# Patient Record
Sex: Male | Born: 1960 | Race: White | Hispanic: No | Marital: Single | Smoking: Never smoker
Health system: Southern US, Community
[De-identification: ages and names within clinical notes are randomized; demographics above are authoritative.]

## PROBLEM LIST (undated history)

## (undated) DIAGNOSIS — I499 Cardiac arrhythmia, unspecified: Secondary | ICD-10-CM

## (undated) DIAGNOSIS — I10 Essential (primary) hypertension: Secondary | ICD-10-CM

## (undated) DIAGNOSIS — I509 Heart failure, unspecified: Secondary | ICD-10-CM

## (undated) DIAGNOSIS — I712 Thoracic aortic aneurysm, without rupture, unspecified: Secondary | ICD-10-CM

## (undated) DIAGNOSIS — I714 Abdominal aortic aneurysm, without rupture, unspecified: Secondary | ICD-10-CM

## (undated) DIAGNOSIS — N189 Chronic kidney disease, unspecified: Secondary | ICD-10-CM

## (undated) DIAGNOSIS — I350 Nonrheumatic aortic (valve) stenosis: Secondary | ICD-10-CM

## (undated) HISTORY — DX: Chronic kidney disease, unspecified: N18.9

## (undated) HISTORY — PX: TONSILLECTOMY: SUR1361

## (undated) HISTORY — DX: Abdominal aortic aneurysm, without rupture, unspecified: I71.40

## (undated) HISTORY — DX: Abdominal aortic aneurysm, without rupture: I71.4

## (undated) HISTORY — DX: Thoracic aortic aneurysm, without rupture, unspecified: I71.20

## (undated) HISTORY — DX: Nonrheumatic aortic (valve) stenosis: I35.0

## (undated) HISTORY — DX: Heart failure, unspecified: I50.9

## (undated) HISTORY — PX: MANDIBLE FRACTURE SURGERY: SHX706

## (undated) HISTORY — DX: Essential (primary) hypertension: I10

---

## 2018-09-21 DIAGNOSIS — I352 Nonrheumatic aortic (valve) stenosis with insufficiency: Secondary | ICD-10-CM

## 2018-09-21 DIAGNOSIS — I361 Nonrheumatic tricuspid (valve) insufficiency: Secondary | ICD-10-CM

## 2018-10-24 ENCOUNTER — Encounter: Payer: Self-pay | Admitting: *Deleted

## 2018-10-24 NOTE — Progress Notes (Signed)
Cardiology Office Note:    Date:  10/25/2018   ID:  Joe MorrowChristopher Beringer, DOB 06/09/1960, MRN [id number]  PCP:  Crist FatVan Eyk, Jason, MD  Cardiologist:  Norman HerrlichBrian Satoru Milich, MD   Referring MD: Crist FatVan Eyk, Jason, MD  ASSESSMENT:    1. Bicuspid aortic valve   2. Nonrheumatic aortic valve stenosis   3. Ascending aorta enlargement (HCC)   4. 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 Chronic combined systolic and diastolic heart failure (HCC)   6. CKD (chronic kidney disease) stage 3, GFR 30-59 ml/min (HCC)    PLAN:    In order of problems listed above:  1. He has congenital heart disease and I educated him regarding bicuspid aortic valve natural history of aortic stenosis coincident a sending aortic enlargement and indications for intervention in the setting of moderate or severe stenosis and heart failure.  We will repeat echocardiogram and I suspect his stenosis is severe and will need to refer for heart catheterization and valvular intervention. 2. Hypertensive heart and kidney disease with heart failure stable New York Heart Association class I presently no fluid overload stable CKD continue his current treatment including antihypertensives and diuretics 3. CKD stable  It was a very complicated visit including 30 minutes spent reviewing records before educating the patient he did not understand the significance heart failure and aortic stenosis educating regarding the need for compliance with sodium restriction medications and follow-up and formulating a plan which shared decision making regarding rechecking echocardiogram potential valvular intervention.  Next appointment 3 weeks after echocardiogram to make a decision plan regarding the necessity of valvular intervention   Medication Adjustments/Labs and Tests Ordered: Current medicines are reviewed at length with the patient today.   Concerns regarding medicines are outlined above.  No orders of the defined types were placed in this encounter.  No orders of the defined types were placed in this encounter.    No chief complaint on file.   History of Present Illness:    Joe Klein is a 58 y.o. male who is being seen today for the evaluation of heart failure at the request of Crist FatVan Eyk, Jason, MD.  I reviewed records from his primary care physician in the [facility] 09/21/2018 to 09/22/2018 there is a notation that the case was discussed with the covering cardiologist Dr. Alisa Graffuffy the consultation was not performed.  This 58 year old man presented with a several day history of increasing shortness of breath and was found to be in congestive heart failure.  His initial blood pressure was 239/126 BNP level 23,900 which decreased to 5830 at discharge.  During the hospitalization underwent an echocardiogram revealing that he had bicuspid aortic valve peak and mean gradients of 41 and 25 mmHg the velocity time interval Gratian ratio was 0.3 and the valve was described as restricted and heavily calcified his chart says he has systolic heart failure but the ejection fraction was 45 to 50% mild left ventricular hypertrophy and the ventricle was not dilated.  The left atrium was severely dilated and there is trace mitral regurgitation.  CTA of the chest confirmed findings of heart failure and also had dilation of a sending aorta maximum diameter 4300.  Renal insufficiency was present with his discharge creatinine of 1.70 GFR 41 cc stage III CKD.  He had hyperlipidemia with a LDL cholesterol 152 HDL 24 the cholesterol itself is not listed in the chart he  is initiated on statin therapy.  His EKG that I independently reviewed from 09/21/2018 showed sinus tachycardia left atrial enlargement left ventricular hypertrophy and repolarization changes.  His most recent BMP performed 10/18/2018 in his PCP office shows a creatinine of  1.64 GFR 45 cc potassium 4.8.  At the time of follow-up with his primary care physician 10/18/2018 his heart failure was compensators he was compliant with his medications and unfortunately her blood pressure is not listed in the note was also referred to nephrology.  In summary he presented with decompensated heart failure and has a bicuspid aortic valve with moderate stenosis but heavy calcification typically indicative of severe stenosis he had the typical associated dilation of his aortic root.  He is a difficult historian unfocused he is concerned about back pain and rectal pain but after 1 hour able to get to the cardiology.  He is not feeling well he is fatigued but not short of breath no chest pain or syncope.  He is unaware that he had a bicuspid aortic valve and he has no background history of hypertension.  He is compliant with his medications and has no edema orthopnea chest pain palpitation or syncope.  There is no family history of a bicuspid aortic valve  Past Medical History:  Diagnosis Date   AAA (abdominal aortic aneurysm) (HCC)    Aortic stenosis    CHF (congestive heart failure) (HCC)    Chronic kidney disease    stage 3   Hypertension     Past Surgical History:  Procedure Laterality Date   MANDIBLE FRACTURE SURGERY     TONSILLECTOMY      Current Medications: Current Meds  Medication Sig   aspirin 81 MG chewable tablet Chew 81 mg by mouth daily.   atorvastatin (LIPITOR) 40 MG tablet Take 40 mg by mouth daily.   furosemide (LASIX) 20 MG tablet Take 20 mg by mouth daily.   labetalol (NORMODYNE) 200 MG tablet Take 200 mg by mouth 2 (two) times daily.   lisinopril (ZESTRIL) 10 MG tablet Take 10 mg by mouth daily.    potassium chloride (K-DUR) 10 MEQ tablet Take 10 mEq by mouth daily.     Allergies:   Patient has no known allergies.   Social History   Socioeconomic History   Marital status: Single    Spouse name: Not on file   Number of children:  Not on file   Years of education: Not on file   Highest education level: Not on file  Occupational History   Not on file  Social Needs   Financial resource strain: Not on file   Food insecurity    Worry: Not on file    Inability: Not on file   Transportation needs    Medical: Not on file    Non-medical: Not on file  Tobacco Use   Smoking status: Never Smoker   Smokeless tobacco: Never Used  Substance and Sexual Activity   Alcohol use: Never    Frequency: Never   Drug use: Never   Sexual activity: Not on file  Lifestyle   Physical activity    Days per week: Not on file    Minutes per session: Not on file   Stress: Not on file  Relationships   Social connections    Talks on phone: Not on file    Gets together: Not on file    Attends religious service: Not on file    Active member of club or organization: Not on  file    Attends meetings of clubs or organizations: Not on file    Relationship status: Not on file  Other Topics Concern   Not on file  Social History Narrative   Not on file     Family History: The patient's family history includes Cancer in his father; Heart disease in his mother; Hypertension in his brother and mother.  ROS:   Review of Systems  Constitution: Positive for malaise/fatigue.  HENT: Negative.   Eyes: Negative.   Cardiovascular: Positive for dyspnea on exertion and orthopnea.  Respiratory: Positive for shortness of breath.   Endocrine: Negative.   Hematologic/Lymphatic: Negative.   Skin: Negative.   Musculoskeletal: Positive for back pain.  Gastrointestinal: Positive for abdominal pain (rectal pain).  Genitourinary: Negative.   Neurological: Negative.   Allergic/Immunologic: Negative.    Please see the history of present illness.     All other systems reviewed and are negative.  EKGs/Labs/Other Studies Reviewed:    The following studies were reviewed today:     Recent Labs: No results found for requested labs  within last 8760 hours.  Recent Lipid Panel No results found for: CHOL, TRIG, HDL, CHOLHDL, VLDL, LDLCALC, LDLDIRECT  Physical Exam:    VS:  BP (!) 142/82 (BP Location: Left Arm, Patient Position: Sitting, Cuff Size: Normal)    Pulse (!) 50    Temp (!) 97.5 F (36.4 C)    Ht 6' (1.829 m)    Wt 182 lb 12.8 oz (82.9 kg)    SpO2 100%    BMI 24.79 kg/m     Wt Readings from Last 3 Encounters:  10/25/18 182 lb 12.8 oz (82.9 kg)     GEN:  Well nourished, well developed in no acute distress HEENT: Normal NECK: No JVD; No carotid bruits LYMPHATICS: No lymphadenopathy CARDIAC: His exam is typical of severe aortic stenosis S2 is single harsh grunting murmur left sternal border through the aortic area to both carotids peaks late no aortic regurgitation no S3 or click RRR, no murmurs, rubs, gallops RESPIRATORY:  Clear to auscultation without rales, wheezing or rhonchi  ABDOMEN: Soft, non-tender, non-distended MUSCULOSKELETAL:  No edema; No deformity  SKIN: Warm and dry NEUROLOGIC:  Alert and oriented x 3 PSYCHIATRIC:  Normal affect     Signed, Norman HerrlichBrian Shulem Mader, MD  10/25/2018 4:30 PM    [facility]

## 2018-10-25 ENCOUNTER — Encounter: Payer: Self-pay | Admitting: Cardiology

## 2018-10-25 ENCOUNTER — Encounter: Payer: Self-pay | Admitting: Cardiothoracic Surgery

## 2018-10-25 ENCOUNTER — Ambulatory Visit (INDEPENDENT_AMBULATORY_CARE_PROVIDER_SITE_OTHER): Payer: Self-pay | Admitting: Cardiology

## 2018-10-25 ENCOUNTER — Other Ambulatory Visit: Payer: Self-pay

## 2018-10-25 DIAGNOSIS — I130 Hypertensive heart and chronic kidney disease with heart failure and stage 1 through stage 4 chronic kidney disease, or unspecified chronic kidney disease: Secondary | ICD-10-CM

## 2018-10-25 DIAGNOSIS — I7789 Other specified disorders of arteries and arterioles: Secondary | ICD-10-CM | POA: Insufficient documentation

## 2018-10-25 DIAGNOSIS — Q231 Congenital insufficiency of aortic valve: Secondary | ICD-10-CM

## 2018-10-25 DIAGNOSIS — I350 Nonrheumatic aortic (valve) stenosis: Secondary | ICD-10-CM

## 2018-10-25 DIAGNOSIS — Q2381 Bicuspid aortic valve: Secondary | ICD-10-CM | POA: Insufficient documentation

## 2018-10-25 DIAGNOSIS — N183 Chronic kidney disease, stage 3 unspecified: Secondary | ICD-10-CM | POA: Insufficient documentation

## 2018-10-25 DIAGNOSIS — I5042 Chronic combined systolic (congestive) and diastolic (congestive) heart failure: Secondary | ICD-10-CM

## 2018-10-25 HISTORY — DX: Other specified disorders of arteries and arterioles: I77.89

## 2018-10-25 HISTORY — DX: Hypertensive heart and chronic kidney disease with heart failure and stage 1 through stage 4 chronic kidney disease, or unspecified chronic kidney disease: I13.0

## 2018-10-25 HISTORY — DX: Bicuspid aortic valve: Q23.81

## 2018-10-25 HISTORY — DX: Chronic combined systolic (congestive) and diastolic (congestive) heart failure: I50.42

## 2018-10-25 HISTORY — DX: Congenital insufficiency of aortic valve: Q23.1

## 2018-10-25 HISTORY — DX: Chronic kidney disease, stage 3 unspecified: N18.30

## 2018-10-25 NOTE — Patient Instructions (Addendum)
Medication Instructions:  Your physician recommends that you continue on your current medications as directed. Please refer to the Current Medication list given to you today.  If you need a refill on your cardiac medications before your next appointment, please call your pharmacy.   Lab work: None If you have labs (blood work) drawn today and your tests are completely normal, you will receive your results only by: Marland Kitchen. MyChart Message (if you have MyChart) OR . A paper copy in the mail If you have any lab test that is abnormal or we need to change your treatment, we will call you to review the results.  Testing/Procedures: Your physician has requested that you have an echocardiogram. Echocardiography is a painless test that uses sound waves to create images of your heart. It provides your doctor with information about the size and shape of your heart and how well your heart's chambers and valves are working. This procedure takes approximately one hour. There are no restrictions for this procedure.    Follow-Up: At [facility], you and your health needs are our priority.  As part of our continuing mission to provide you with exceptional heart care, we have created designated Provider Care Teams.  These Care Teams include your primary Cardiologist (physician) and Advanced Practice Providers (APPs -  Physician Assistants and Nurse Practitioners) who all work together to provide you with the care you need, when you need it. You will need a follow up appointment in 3 weeks.  Any Other Special Instructions Will Be Listed Below (If Applicable).    Echocardiogram An echocardiogram is a procedure that uses painless sound waves (ultrasound) to produce an image of the heart. Images from an echocardiogram can provide important information about:  Signs of coronary artery disease (CAD).  Aneurysm detection. An aneurysm is a weak or damaged part of an artery wall that bulges out from the normal force  of blood pumping through the body.  Heart size and shape. Changes in the size or shape of the heart can be associated with certain conditions, including heart failure, aneurysm, and CAD.  Heart muscle function.  Heart valve function.  Signs of a past heart attack.  Fluid buildup around the heart.  Thickening of the heart muscle.  A tumor or infectious growth around the heart valves. Tell a health care provider about:  Any allergies you have.  All medicines you are taking, including vitamins, herbs, eye drops, creams, and over-the-counter medicines.  Any blood disorders you have.  Any surgeries you have had.  Any medical conditions you have.  Whether you are pregnant or may be pregnant. What are the risks? Generally, this is a safe procedure. However, problems may occur, including:  Allergic reaction to dye (contrast) that may be used during the procedure. What happens before the procedure? No specific preparation is needed. You may eat and drink normally. What happens during the procedure?   An IV tube may be inserted into one of your veins.  You may receive contrast through this tube. A contrast is an injection that improves the quality of the pictures from your heart.  A gel will be applied to your chest.  A wand-like tool (transducer) will be moved over your chest. The gel will help to transmit the sound waves from the transducer.  The sound waves will harmlessly bounce off of your heart to allow the heart images to be captured in real-time motion. The images will be recorded on a computer. The procedure may vary  among health care providers and hospitals. What happens after the procedure?  You may return to your normal, everyday life, including diet, activities, and medicines, unless your health care provider tells you not to do that. Summary  An echocardiogram is a procedure that uses painless sound waves (ultrasound) to produce an image of the heart.  Images  from an echocardiogram can provide important information about the size and shape of your heart, heart muscle function, heart valve function, and fluid buildup around your heart.  You do not need to do anything to prepare before this procedure. You may eat and drink normally.  After the echocardiogram is completed, you may return to your normal, everyday life, unless your health care provider tells you not to do that. This information is not intended to replace advice given to you by your health care provider. Make sure you discuss any questions you have with your health care provider. Document Released: 04/17/2000 Document Revised: 05/23/2016 Document Reviewed: 05/23/2016 Elsevier Interactive Patient Education  2019 [employer].

## 2018-11-02 ENCOUNTER — Ambulatory Visit (HOSPITAL_BASED_OUTPATIENT_CLINIC_OR_DEPARTMENT_OTHER)
Admission: RE | Admit: 2018-11-02 | Discharge: 2018-11-02 | Disposition: A | Discharge status: Home / Self Care | Payer: Medicaid Other | Source: Ambulatory Visit | Attending: Cardiology | Admitting: Cardiology

## 2018-11-02 ENCOUNTER — Other Ambulatory Visit: Payer: Self-pay

## 2018-11-02 DIAGNOSIS — I130 Hypertensive heart and chronic kidney disease with heart failure and stage 1 through stage 4 chronic kidney disease, or unspecified chronic kidney disease: Secondary | ICD-10-CM | POA: Insufficient documentation

## 2018-11-02 DIAGNOSIS — I5042 Chronic combined systolic (congestive) and diastolic (congestive) heart failure: Secondary | ICD-10-CM | POA: Insufficient documentation

## 2018-11-02 DIAGNOSIS — I350 Nonrheumatic aortic (valve) stenosis: Secondary | ICD-10-CM | POA: Insufficient documentation

## 2018-11-02 DIAGNOSIS — Q231 Congenital insufficiency of aortic valve: Secondary | ICD-10-CM | POA: Insufficient documentation

## 2018-11-02 DIAGNOSIS — N183 Chronic kidney disease, stage 3 (moderate): Secondary | ICD-10-CM

## 2018-11-02 NOTE — Progress Notes (Signed)
  Echocardiogram 2D Echocardiogram has been performed.   Joe Klein 11/02/2018, 3:17 PM

## 2018-11-10 ENCOUNTER — Other Ambulatory Visit: Payer: Self-pay

## 2018-11-11 ENCOUNTER — Institutional Professional Consult (permissible substitution) (INDEPENDENT_AMBULATORY_CARE_PROVIDER_SITE_OTHER): Payer: Self-pay | Admitting: Cardiothoracic Surgery

## 2018-11-11 VITALS — BP 174/70 | HR 47 | Temp 97.7°F | Resp 20 | Ht 72.0 in | Wt 178.0 lb

## 2018-11-11 DIAGNOSIS — I7121 Aneurysm of the ascending aorta, without rupture: Secondary | ICD-10-CM

## 2018-11-11 DIAGNOSIS — I712 Thoracic aortic aneurysm, without rupture: Secondary | ICD-10-CM

## 2018-11-11 DIAGNOSIS — I350 Nonrheumatic aortic (valve) stenosis: Secondary | ICD-10-CM

## 2018-11-13 NOTE — H&P (View-Only) (Signed)
Cardiology Office Note:    Date:  11/15/2018   ID:  Joe Klein, DOB 1960-11-20, MRN [id number]  PCP:  Townsend Roger, MD  Cardiologist:  Shirlee More, MD    Referring MD: Townsend Roger, MD    ASSESSMENT:    1. Bicuspid aortic valve   2. Nonrheumatic aortic valve stenosis   3. Ascending aorta enlargement (HCC)   4. 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 Chronic combined systolic and diastolic heart failure ([place])   6. CKD (chronic kidney disease) stage 3, GFR 30-59 ml/min (HCC)    PLAN:    In order of problems listed above:  1. Bicuspid aortic valve/Nonrheumatic aortic valve stenosis/Ascending aorta enlargement -he has been seen by CT surgery proceed with right and left heart catheterization he has mild renal insufficiency his heart failure is compensated and receive IV fluids protect his kidney. 2. Hypertensive heart and kidney disease with chronic combined systolic and diastolic CHF and CKD3 -stable heart failure compensated continue current treatment 3. Chronic combined systolic and diastolic heart failure -stable heart failure compensated continue current treatment CKD3 -stable heart failure compensated continue current treatment  Next appointment: After his valve surgery   Medication Adjustments/Labs and Tests Ordered: Current medicines are reviewed at length with the patient today.  Concerns regarding medicines are outlined above.  Orders Placed This Encounter  Procedures  . CBC  . Basic Metabolic Panel (BMET)  . EKG 12-Lead   No orders of the defined types were placed in this encounter.   Chief Complaint  Patient presents with  . Aortic Stenosis    History of Present Illness:    Joe Klein is a 58 y.o. male with a hx of heart failure, bicuspid aortic valve aneurysm ascending aorta, hypertension with chronic kidney disease stage III  and combined systolic and diastolic heart failure.  He was last seen by me 10/25/2018 and was seen by CT surgery, Dr. Orvan Seen, 11/11/2018. At his visit with CT surgery was noted that he would require right and left heart cath followed by repeat appointment with CT surgery for preop consideration.   His echocardiogram 11/02/2018 shows aneurysm ascending aorta maximum diameter 45 mm, bicuspid aortic valve mild to moderate regurgitation, moderate to severe stenosis, and ejection fraction 60 to 65% with echo findings of elevated left atrial pressure.  His peak and mean gradients were 62 and 34 mmHg with a VTI ratio of 0.35.  And a normal stroke-volume index of 36 cc/m.  Compliance with diet, lifestyle and medications: Yes  Fortunately has been seen by CT surgery understands and accepts the need for surgical intervention for severe aortic stenosis and aortic aneurysm.  He is struggling with this little bit emotionally having trouble sleeping at night consult tearful my office talking about the lack of insurance in the day has a heart catheterization will get a social service consult he may qualify for Medicaid.  He is having no edema shortness of breath chest pain palpitation or syncope but does not feel well and is quite apprehensive.  I discussed heart catheterization with him benefits options and risk informed consent obtained.  Past Medical History:  Diagnosis Date  . AAA (abdominal aortic aneurysm) ([place])   . Aortic stenosis   . CHF (congestive heart failure) ([place])   . Chronic kidney disease    stage 3  . Hypertension     Past Surgical History:  Procedure Laterality Date  . MANDIBLE FRACTURE SURGERY    . TONSILLECTOMY      Current Medications: Current Meds  Medication Sig  . aspirin 81 MG chewable tablet Chew 81 mg by mouth daily.  Marland Kitchen. atorvastatin (LIPITOR) 40 MG tablet Take 40 mg by mouth daily.  . furosemide (LASIX) 20 MG tablet Take 20 mg by mouth daily.  Marland Kitchen. labetalol (NORMODYNE) 200 MG  tablet Take 200 mg by mouth 2 (two) times daily.  Marland Kitchen. lisinopril (ZESTRIL) 10 MG tablet Take 10 mg by mouth daily.   . potassium chloride (K-DUR) 10 MEQ tablet Take 10 mEq by mouth daily.     Allergies:   Patient has no known allergies.   Social History   Socioeconomic History  . Marital status: Single    Spouse name: Not on file  . Number of children: Not on file  . Years of education: Not on file  . Highest education level: Not on file  Occupational History  . Not on file  Social Needs  . Financial resource strain: Not on file  . Food insecurity    Worry: Not on file    Inability: Not on file  . Transportation needs    Medical: Not on file    Non-medical: Not on file  Tobacco Use  . Smoking status: Never Smoker  . Smokeless tobacco: Never Used  Substance and Sexual Activity  . Alcohol use: Never    Frequency: Never  . Drug use: Never  . Sexual activity: Not on file  Lifestyle  . Physical activity    Days per week: Not on file    Minutes per session: Not on file  . Stress: Not on file  Relationships  . Social Musicianconnections    Talks on phone: Not on file    Gets together: Not on file    Attends religious service: Not on file    Active member of club or organization: Not on file    Attends meetings of clubs or organizations: Not on file    Relationship status: Not on file  Other Topics Concern  . Not on file  Social History Narrative  . Not on file     Family History: The patient's family history includes Cancer in his father; Heart disease in his mother; Hypertension in his brother and mother. ROS:   Please see the history of present illness.    All other systems reviewed and are negative.  EKGs/Labs/Other Studies Reviewed:    The following studies were reviewed today:  EKG:  EKG ordered today and personally reviewed.  The ekg ordered today demonstrates  Sinus bradycardia 47 bpm nonspecific ST-T changes   Physical Exam:    VS:  BP (!) 162/80 (BP  Location: Right Arm, Patient Position: Sitting, Cuff Size: Normal)   Pulse (!) 47   Temp (!) 97.4 F (36.3 C)   Ht 6' (1.829 m)   Wt 182 lb (82.6 kg)   SpO2 98%   BMI 24.68 kg/m     Wt Readings from Last 3 Encounters:  11/15/18 182 lb (82.6 kg)  11/11/18 178 lb (80.7 kg)  10/25/18 182 lb 12.8 oz (82.9 kg)     GEN: Very anxious and little tearful in the office today well nourished, well developed in no acute distress HEENT: Normal NECK: No JVD; No carotid bruits LYMPHATICS: No lymphadenopathy CARDIAC: S2 single harsh 4/6 murmur left ear peaks late radiates to the carotids consistent with severe symptomatic aortic stenosis RRR, no  murmurs, rubs, gallops RESPIRATORY:  Clear to auscultation without rales, wheezing or rhonchi  ABDOMEN: Soft, non-tender, non-distended MUSCULOSKELETAL:  No edema; No deformity  SKIN: Warm and dry NEUROLOGIC:  Alert and oriented x 3 PSYCHIATRIC:  Normal affect    Signed, Norman HerrlichBrian Munley, MD  11/15/2018 11:49 AM    [facility]

## 2018-11-13 NOTE — Progress Notes (Signed)
Cardiology Office Note:    Date:  11/15/2018   ID:  Nunzio Cobbs, DOB 1960-11-20, MRN [id number]  PCP:  Townsend Roger, MD  Cardiologist:  Shirlee More, MD    Referring MD: Townsend Roger, MD    ASSESSMENT:    1. Bicuspid aortic valve   2. Nonrheumatic aortic valve stenosis   3. Ascending aorta enlargement (HCC)   4. 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 Chronic combined systolic and diastolic heart failure ([place])   6. CKD (chronic kidney disease) stage 3, GFR 30-59 ml/min (HCC)    PLAN:    In order of problems listed above:  1. Bicuspid aortic valve/Nonrheumatic aortic valve stenosis/Ascending aorta enlargement -he has been seen by CT surgery proceed with right and left heart catheterization he has mild renal insufficiency his heart failure is compensated and receive IV fluids protect his kidney. 2. Hypertensive heart and kidney disease with chronic combined systolic and diastolic CHF and CKD3 -stable heart failure compensated continue current treatment 3. Chronic combined systolic and diastolic heart failure -stable heart failure compensated continue current treatment CKD3 -stable heart failure compensated continue current treatment  Next appointment: After his valve surgery   Medication Adjustments/Labs and Tests Ordered: Current medicines are reviewed at length with the patient today.  Concerns regarding medicines are outlined above.  Orders Placed This Encounter  Procedures  . CBC  . Basic Metabolic Panel (BMET)  . EKG 12-Lead   No orders of the defined types were placed in this encounter.   Chief Complaint  Patient presents with  . Aortic Stenosis    History of Present Illness:    Joe Klein is a 58 y.o. male with a hx of heart failure, bicuspid aortic valve aneurysm ascending aorta, hypertension with chronic kidney disease stage III  and combined systolic and diastolic heart failure.  He was last seen by me 10/25/2018 and was seen by CT surgery, Dr. Orvan Seen, 11/11/2018. At his visit with CT surgery was noted that he would require right and left heart cath followed by repeat appointment with CT surgery for preop consideration.   His echocardiogram 11/02/2018 shows aneurysm ascending aorta maximum diameter 45 mm, bicuspid aortic valve mild to moderate regurgitation, moderate to severe stenosis, and ejection fraction 60 to 65% with echo findings of elevated left atrial pressure.  His peak and mean gradients were 62 and 34 mmHg with a VTI ratio of 0.35.  And a normal stroke-volume index of 36 cc/m.  Compliance with diet, lifestyle and medications: Yes  Fortunately has been seen by CT surgery understands and accepts the need for surgical intervention for severe aortic stenosis and aortic aneurysm.  He is struggling with this little bit emotionally having trouble sleeping at night consult tearful my office talking about the lack of insurance in the day has a heart catheterization will get a social service consult he may qualify for Medicaid.  He is having no edema shortness of breath chest pain palpitation or syncope but does not feel well and is quite apprehensive.  I discussed heart catheterization with him benefits options and risk informed consent obtained.  Past Medical History:  Diagnosis Date  . AAA (abdominal aortic aneurysm) ([place])   . Aortic stenosis   . CHF (congestive heart failure) ([place])   . Chronic kidney disease    stage 3  . Hypertension     Past Surgical History:  Procedure Laterality Date  . MANDIBLE FRACTURE SURGERY    . TONSILLECTOMY      Current Medications: Current Meds  Medication Sig  . aspirin 81 MG chewable tablet Chew 81 mg by mouth daily.  . atorvastatin (LIPITOR) 40 MG tablet Take 40 mg by mouth daily.  . furosemide (LASIX) 20 MG tablet Take 20 mg by mouth daily.  . labetalol (NORMODYNE) 200 MG  tablet Take 200 mg by mouth 2 (two) times daily.  . lisinopril (ZESTRIL) 10 MG tablet Take 10 mg by mouth daily.   . potassium chloride (K-DUR) 10 MEQ tablet Take 10 mEq by mouth daily.     Allergies:   Patient has no known allergies.   Social History   Socioeconomic History  . Marital status: Single    Spouse name: Not on file  . Number of children: Not on file  . Years of education: Not on file  . Highest education level: Not on file  Occupational History  . Not on file  Social Needs  . Financial resource strain: Not on file  . Food insecurity    Worry: Not on file    Inability: Not on file  . Transportation needs    Medical: Not on file    Non-medical: Not on file  Tobacco Use  . Smoking status: Never Smoker  . Smokeless tobacco: Never Used  Substance and Sexual Activity  . Alcohol use: Never    Frequency: Never  . Drug use: Never  . Sexual activity: Not on file  Lifestyle  . Physical activity    Days per week: Not on file    Minutes per session: Not on file  . Stress: Not on file  Relationships  . Social connections    Talks on phone: Not on file    Gets together: Not on file    Attends religious service: Not on file    Active member of club or organization: Not on file    Attends meetings of clubs or organizations: Not on file    Relationship status: Not on file  Other Topics Concern  . Not on file  Social History Narrative  . Not on file     Family History: The patient's family history includes Cancer in his father; Heart disease in his mother; Hypertension in his brother and mother. ROS:   Please see the history of present illness.    All other systems reviewed and are negative.  EKGs/Labs/Other Studies Reviewed:    The following studies were reviewed today:  EKG:  EKG ordered today and personally reviewed.  The ekg ordered today demonstrates  Sinus bradycardia 47 bpm nonspecific ST-T changes   Physical Exam:    VS:  BP (!) 162/80 (BP  Location: Right Arm, Patient Position: Sitting, Cuff Size: Normal)   Pulse (!) 47   Temp (!) 97.4 F (36.3 C)   Ht 6' (1.829 m)   Wt 182 lb (82.6 kg)   SpO2 98%   BMI 24.68 kg/m     Wt Readings from Last 3 Encounters:  11/15/18 182 lb (82.6 kg)  11/11/18 178 lb (80.7 kg)  10/25/18 182 lb 12.8 oz (82.9 kg)     GEN: Very anxious and little tearful in the office today well nourished, well developed in no acute distress HEENT: Normal NECK: No JVD; No carotid bruits LYMPHATICS: No lymphadenopathy CARDIAC: S2 single harsh 4/6 murmur left ear peaks late radiates to the carotids consistent with severe symptomatic aortic stenosis RRR, no   murmurs, rubs, gallops RESPIRATORY:  Clear to auscultation without rales, wheezing or rhonchi  ABDOMEN: Soft, non-tender, non-distended MUSCULOSKELETAL:  No edema; No deformity  SKIN: Warm and dry NEUROLOGIC:  Alert and oriented x 3 PSYCHIATRIC:  Normal affect    Signed, Norman HerrlichBrian Calhoun Reichardt, MD  11/15/2018 11:49 AM    [facility]

## 2018-11-14 NOTE — Patient Instructions (Signed)
F/u with Dr. Bettina Gavia as scheduled. Suggest left and right heart catheterization asap. F/U with our office as soon as catheterizations performed

## 2018-11-14 NOTE — Progress Notes (Signed)
[address]       [place] [id number]             [phone]     CARDIOTHORACIC SURGERY CONSULTATION REPORT  Referring Provider is Crist FatVan Eyk, Jason, MD Primary Cardiologist is No primary care provider on file. PCP is Crist FatVan Eyk, Jason, MD  Chief Complaint  Patient presents with  . Thoracic Aortic Aneurysm    Surgical eval,CTA Chest 09/21/18     HPI:  Very pleasant previously healthy 58 year old gentleman presents as an outpatient for evaluation of a sending aortic aneurysm.  This was diagnosed earlier this month when he presented to his local physicians with 4 days of sleeplessness.  He states that he had difficulty with catching his breath upon lying down.  At the time of presentation by report, his BNP was found to be over 23,000.  Echocardiogram demonstrated a bicuspid aortic valve and severe aortic stenosis.  In addition, work-up at that time demonstrated renal insufficiency with creatinine of approximately 1.7, bilateral pleural effusions, and untreated hypertension.  The patient was placed on 2 different antihypertensives and diuretics and has been feeling better for the last few days.  He arrives at the clinic relatively nervous and anxious.  He denies any family history of aneurysm disease.  Ever, his mother died at an early age.  Past Medical History:  Diagnosis Date  . AAA (abdominal aortic aneurysm) (HCC)   . Aortic stenosis   . CHF (congestive heart failure) (HCC)   . Chronic kidney disease    stage 3  . Hypertension     Past Surgical History:  Procedure Laterality Date  . MANDIBLE FRACTURE SURGERY    . TONSILLECTOMY      Family History  Problem Relation Age of Onset  . Heart disease Mother   . Hypertension Mother   . Cancer Father   . Hypertension Brother     Social History   Socioeconomic History  . Marital status: Single    Spouse name: Not on file  . Number of children: Not on file  . Years of education: Not on file  . Highest  education level: Not on file  Occupational History  . Not on file  Social Needs  . Financial resource strain: Not on file  . Food insecurity    Worry: Not on file    Inability: Not on file  . Transportation needs    Medical: Not on file    Non-medical: Not on file  Tobacco Use  . Smoking status: Never Smoker  . Smokeless tobacco: Never Used  Substance and Sexual Activity  . Alcohol use: Never    Frequency: Never  . Drug use: Never  . Sexual activity: Not on file  Lifestyle  . Physical activity    Days per week: Not on file    Minutes per session: Not on file  . Stress: Not on file  Relationships  . Social Musicianconnections    Talks on phone: Not on file    Gets together: Not on file    Attends religious service: Not on file    Active member of club or organization: Not on file    Attends meetings of clubs or organizations: Not on file    Relationship status: Not on file  . Intimate partner violence    Fear of current or ex partner: Not on file    Emotionally abused: Not on file    Physically abused: Not on  file    Forced sexual activity: Not on file  Other Topics Concern  . Not on file  Social History Narrative  . Not on file    Current Outpatient Medications  Medication Sig Dispense Refill  . aspirin 81 MG chewable tablet Chew 81 mg by mouth daily.    Marland Kitchen atorvastatin (LIPITOR) 40 MG tablet Take 40 mg by mouth daily.    . furosemide (LASIX) 20 MG tablet Take 20 mg by mouth daily.    Marland Kitchen labetalol (NORMODYNE) 200 MG tablet Take 200 mg by mouth 2 (two) times daily.    Marland Kitchen lisinopril (ZESTRIL) 10 MG tablet Take 10 mg by mouth daily.     . potassium chloride (K-DUR) 10 MEQ tablet Take 10 mEq by mouth daily.     No current facility-administered medications for this visit.     No Known Allergies    Review of Systems:   General:  No change appetite, good energy, no weight gain, no weight loss, denies fever  Cardiac:  Negative chest pain with exertion or at rest, mild SOB  with exertion, history of resting SOB, positive PND, positive orthopnea, negative palpitations/arrhythmia, negative LE edema, negative dizzy spells, no syncope  Respiratory:  - shortness of breath, - home oxygen, - productive cough, -dry cough, - bronchitis, -wheezing, - hemoptysis, negative asthma, - pain with inspiration or cough, - sleep apnea, - CPAP at night  GI:   Negative  GU:  Negative  Vascular:  Negative  Neuro:   No known stroke or TIA's, - seizures, - headaches,   Musculoskeletal: Within normal limits  Skin:   Negative  Psych:   Positive anxiety,- depression, positive nervousness, positive recent unusual stress  Eyes:   Positive blurry vision, left   ENT:   Negative  Hematologic:  negative  Endocrine:  nodiabetes,      Physical Exam:   BP (!) 174/70   Pulse (!) 47   Temp 97.7 F (36.5 C) (Skin)   Resp 20   Ht 6' (1.829 m)   Wt 80.7 kg   SpO2 98%   BMI 24.14 kg/m   General:   well-appearing, white male in no acute distress  HEENT:  Normocephalic atraumatic.  Moist mucosa.  Neck:   no JVD, no bruits, no adenopathy   Chest:   clear to auscultation, symmetrical breath sounds, no wheezes, no rhonchi  CV:   RRR, 2/6 systolic ejection murmur at right sternal border;   Abdomen:  soft, non-tender, no masses   Extremities:  warm, well-perfused, pulses intact throughout, no LE edema  Rectal/GU  Deferred  Neuro:   Grossly non-focal and symmetrical throughout  Skin:   Clean and dry, no rashes, no breakdown   Diagnostic Tests:  I have personally reviewed his transthoracic echo from 11/02/2018 and agree with the interpretation demonstrating severe aortic stenosis and moderate aortic insufficiency.  In addition the left ventricle is mildly dilated and hypertrophic as well.  No other notable valvular abnormalities I have also reviewed the CT scan of the chest from 10/25/2018.  This demonstrates an a sending aortic aneurysm measuring approximately 5 cm in maximal diameter at the  level of the mid pulmonary artery.   Impression:  Very pleasant 58 year old gentleman recently experiencing an episode of heart failure and found to have underlying renal insufficiency with baseline creatinine near 2 pleural effusions, and severe aortic stenosis due to bicuspid disease.  As such, that he meets criteria for aortic valve replacement in relatively short order.  The patient will need to undergo left heart catheterization prior to valve surgery.  Of course, at the same time I would lean towards replacing the a sending aorta given the bicuspid disease underlying.   Plan:  Follow-up with Dr. Dulce SellarMunley the week of 11/14/2018.  Will need a right and left heart catheterization in short order.  When the heart cath is completed we will reschedule an appointment for preop consideration.  The patient appeared amenable to these ideas and would like to have his valvular heart disease and ascending aneurysm addressed soon.   I spent in excess of 45 minutes during the conduct of this office consultation and >50% of this time involved direct face-to-face encounter with the patient for counseling and/or coordination of their care.          Level 3 Office Consult = 40 minutes         Level 4 Office Consult = 60 minutes         Level 5 Office Consult = 80 minutes  B. Lorayne MarekZane Azaria Bartell, MD 11/14/2018 1:34 PM

## 2018-11-15 ENCOUNTER — Encounter: Payer: Self-pay | Admitting: Cardiology

## 2018-11-15 ENCOUNTER — Other Ambulatory Visit (HOSPITAL_COMMUNITY)
Admission: RE | Admit: 2018-11-15 | Discharge: 2018-11-15 | Disposition: A | Discharge status: Home / Self Care | Payer: HRSA Program | Source: Ambulatory Visit | Attending: Interventional Cardiology | Admitting: Interventional Cardiology

## 2018-11-15 ENCOUNTER — Ambulatory Visit (INDEPENDENT_AMBULATORY_CARE_PROVIDER_SITE_OTHER): Payer: Self-pay | Admitting: Cardiology

## 2018-11-15 ENCOUNTER — Other Ambulatory Visit: Payer: Self-pay

## 2018-11-15 VITALS — BP 162/80 | HR 47 | Temp 97.4°F | Ht 72.0 in | Wt 182.0 lb

## 2018-11-15 DIAGNOSIS — Z1159 Encounter for screening for other viral diseases: Secondary | ICD-10-CM | POA: Diagnosis not present

## 2018-11-15 DIAGNOSIS — I130 Hypertensive heart and chronic kidney disease with heart failure and stage 1 through stage 4 chronic kidney disease, or unspecified chronic kidney disease: Secondary | ICD-10-CM

## 2018-11-15 DIAGNOSIS — I7789 Other specified disorders of arteries and arterioles: Secondary | ICD-10-CM

## 2018-11-15 DIAGNOSIS — I5042 Chronic combined systolic (congestive) and diastolic (congestive) heart failure: Secondary | ICD-10-CM

## 2018-11-15 DIAGNOSIS — I350 Nonrheumatic aortic (valve) stenosis: Secondary | ICD-10-CM

## 2018-11-15 DIAGNOSIS — Q231 Congenital insufficiency of aortic valve: Secondary | ICD-10-CM

## 2018-11-15 DIAGNOSIS — N183 Chronic kidney disease, stage 3 unspecified: Secondary | ICD-10-CM

## 2018-11-15 NOTE — Patient Instructions (Signed)
Medication Instructions:  Your physician recommends that you continue on your current medications as directed. Please refer to the Current Medication list given to you today.  If you need a refill on your cardiac medications before your next appointment, please call your pharmacy.   Lab work: Your physician recommends that you return for lab work in: TODAY CBC,BMP  If you have labs (blood work) drawn today and your tests are completely normal, you will receive your results only by: Marland Kitchen MyChart Message (if you have MyChart) OR . A paper copy in the mail If you have any lab test that is abnormal or we need to change your treatment, we will call you to review the results.  Testing/Procedures:   [place] [id number] Dept: [phone] Loc: [place]  11/15/2018  You are scheduled for a Cardiac Catheterization on Friday, July 17 with Dr. Larae Grooms.  1. Please arrive at the [facility] (Main Entrance A) at [facility]: [address] [place], [place] [address] at 5:30 AM (This time is two hours before your procedure to ensure your preparation). Free valet parking service is available.   Special note: Every effort is made to have your procedure done on time. Please understand that emergencies sometimes delay scheduled procedures.  2. Diet: Do not eat solid foods after midnight.  The patient may have clear liquids until 5am upon the day of the procedure.  3. Labs:  NOne needed  4. Medication instructions in preparation for your procedure: Take all meds except Lasix   Contrast Allergy: No    On the morning of your procedure, take your Aspirin and any morning medicines NOT listed above.  You may use sips of water.  5. Plan for one night stay--bring personal belongings. 6. Bring a current list of your medications and current insurance cards.  7. You MUST have a responsible person to drive you home. 8. Someone MUST be with you the first 24 hours after you arrive home or your discharge will be delayed. 9. Please wear clothes that are easy to get on and off and wear slip-on shoes.  Thank you for allowing [place] to care for you!   -- [facility]   Follow-Up: At [facility], you and your health needs are our priority.  As part of our continuing mission to provide you with exceptional heart care, we have created designated Provider Care Teams.  These Care Teams include your primary Cardiologist (physician) and Advanced Practice Providers (APPs -  Physician Assistants and Nurse Practitioners) who all work together to provide you with the care you need, when you need it. You will need a follow up appointment in 2 weeks.    Any Other Special Instructions Will Be Listed Below (If Applicable).  [place] [address] [place] FOR A COVID SCREENING AT 1:20 PM

## 2018-11-16 LAB — BASIC METABOLIC PANEL
BUN/Creatinine Ratio: 17 (ref 9–20)
BUN: 35 mg/dL — ABNORMAL HIGH (ref 6–24)
CO2: 23 mmol/L (ref 20–29)
Calcium: 9.6 mg/dL (ref 8.7–10.2)
Chloride: 103 mmol/L (ref 96–106)
Creatinine, Ser: 2.01 mg/dL — ABNORMAL HIGH (ref 0.76–1.27)
GFR calc Af Amer: 41 mL/min/{1.73_m2} — ABNORMAL LOW (ref >59)
GFR calc non Af Amer: 35 mL/min/{1.73_m2} — ABNORMAL LOW (ref >59)
Glucose: 91 mg/dL (ref 65–99)
Potassium: 4.6 mmol/L (ref 3.5–5.2)
Sodium: 142 mmol/L (ref 134–144)

## 2018-11-16 LAB — CBC
Hematocrit: 42.8 % (ref 37.5–51.0)
Hemoglobin: 13.8 g/dL (ref 13.0–17.7)
MCH: 27.9 pg (ref 26.6–33.0)
MCHC: 32.2 g/dL (ref 31.5–35.7)
MCV: 87 fL (ref 79–97)
Platelets: 202 10*3/uL (ref 150–450)
RBC: 4.95 x10E6/uL (ref 4.14–5.80)
RDW: 13.5 % (ref 11.6–15.4)
WBC: 7 10*3/uL (ref 3.4–10.8)

## 2018-11-16 LAB — SARS CORONAVIRUS 2 (TAT 6-24 HRS): SARS Coronavirus 2: NEGATIVE

## 2018-11-17 ENCOUNTER — Telehealth: Payer: Self-pay | Admitting: *Deleted

## 2018-11-17 NOTE — Telephone Encounter (Addendum)
Pt contacted pre-catheterization scheduled at [facility] for: Friday November 18, 2018 10:30 AM Verified arrival time and place: [place] [facility] Entrance A at: 5:30 AM-pre procedure hydration  Covid-19 test date: 11/15/18  No solid food after midnight prior to cath, clear liquids until 5 AM day of procedure. Contrast allergy: no  Hold: GFR 35 Lasix-day before and day of procedure. KCl-day before and day of procedure. Lisinopril-day before and day of procedure. Pt had already taken lasix, KCl, lisinopril 11/17/18, will hold AM 11/18/18.  Except hold medications AM meds can be  taken pre-cath with sip of water including: ASA 81 mg   Confirm patient has responsible person to drive home post procedure and observe 24 hours after arriving home: yes  Pt advised [place] requires patients to wear mask when they enter hospital.    Pt advised due to Covid-19 pandemic, no visitors are allowed in the hospital at this time.  Their designated party will be called with an update after procedure.       COVID-19 Pre-Screening Questions:  . In the past 7 to 10 days have you had a cough,  shortness of breath, headache, congestion, fever (100 or greater) body aches, chills, sore throat, or sudden loss of taste or sense of smell? no . Have you been around anyone with known Covid 19? no . Have you been around anyone who is awaiting Covid 19 test results in the past 7 to 10 days? no . Have you been around anyone who has been exposed to Covid 19, or has mentioned symptoms of Covid 19 within the past 7 to 10 days? no    I reviewed procedure instructions, mask/visitor/Covid-19 screening questions with patient, he verbalized understanding, thanked me for call.

## 2018-11-17 NOTE — Telephone Encounter (Signed)
Per [facility]-unable to locate EKG ordered and done 11/15/18,will not charge patient for EKG.  I called patient to let him know he would need to have EKG at hospital tomorrow morning, left a voice mail message (DPR) with that information.

## 2018-11-18 ENCOUNTER — Encounter (HOSPITAL_COMMUNITY)
Admission: RE | Disposition: A | Discharge status: Home / Self Care | Payer: Self-pay | Source: Home / Self Care | Attending: Interventional Cardiology

## 2018-11-18 ENCOUNTER — Other Ambulatory Visit: Payer: Self-pay

## 2018-11-18 ENCOUNTER — Ambulatory Visit (HOSPITAL_COMMUNITY)
Admission: RE | Admit: 2018-11-18 | Discharge: 2018-11-18 | Disposition: A | Discharge status: Home / Self Care | Payer: Medicaid Other | Attending: Interventional Cardiology | Admitting: Interventional Cardiology

## 2018-11-18 ENCOUNTER — Telehealth: Payer: Self-pay | Admitting: *Deleted

## 2018-11-18 DIAGNOSIS — I130 Hypertensive heart and chronic kidney disease with heart failure and stage 1 through stage 4 chronic kidney disease, or unspecified chronic kidney disease: Secondary | ICD-10-CM | POA: Insufficient documentation

## 2018-11-18 DIAGNOSIS — Z79899 Other long term (current) drug therapy: Secondary | ICD-10-CM | POA: Insufficient documentation

## 2018-11-18 DIAGNOSIS — I350 Nonrheumatic aortic (valve) stenosis: Secondary | ICD-10-CM

## 2018-11-18 DIAGNOSIS — I5042 Chronic combined systolic (congestive) and diastolic (congestive) heart failure: Secondary | ICD-10-CM | POA: Diagnosis not present

## 2018-11-18 DIAGNOSIS — Q2381 Bicuspid aortic valve: Secondary | ICD-10-CM

## 2018-11-18 DIAGNOSIS — I712 Thoracic aortic aneurysm, without rupture, unspecified: Secondary | ICD-10-CM

## 2018-11-18 DIAGNOSIS — Z7982 Long term (current) use of aspirin: Secondary | ICD-10-CM | POA: Insufficient documentation

## 2018-11-18 DIAGNOSIS — N183 Chronic kidney disease, stage 3 unspecified: Secondary | ICD-10-CM | POA: Diagnosis present

## 2018-11-18 DIAGNOSIS — Z8249 Family history of ischemic heart disease and other diseases of the circulatory system: Secondary | ICD-10-CM | POA: Diagnosis not present

## 2018-11-18 DIAGNOSIS — I447 Left bundle-branch block, unspecified: Secondary | ICD-10-CM | POA: Insufficient documentation

## 2018-11-18 DIAGNOSIS — I4891 Unspecified atrial fibrillation: Secondary | ICD-10-CM | POA: Insufficient documentation

## 2018-11-18 DIAGNOSIS — I2510 Atherosclerotic heart disease of native coronary artery without angina pectoris: Secondary | ICD-10-CM | POA: Insufficient documentation

## 2018-11-18 DIAGNOSIS — I7789 Other specified disorders of arteries and arterioles: Secondary | ICD-10-CM | POA: Diagnosis present

## 2018-11-18 DIAGNOSIS — Q231 Congenital insufficiency of aortic valve: Secondary | ICD-10-CM

## 2018-11-18 HISTORY — PX: RIGHT/LEFT HEART CATH AND CORONARY ANGIOGRAPHY: CATH118266

## 2018-11-18 LAB — POCT I-STAT EG7
Acid-base deficit: 2 mmol/L (ref 0.0–2.0)
Bicarbonate: 24.7 mmol/L (ref 20.0–28.0)
Calcium, Ion: 1.31 mmol/L (ref 1.15–1.40)
HCT: 40 % (ref 39.0–52.0)
Hemoglobin: 13.6 g/dL (ref 13.0–17.0)
O2 Saturation: 78 %
Potassium: 4.5 mmol/L (ref 3.5–5.1)
Sodium: 142 mmol/L (ref 135–145)
TCO2: 26 mmol/L (ref 22–32)
pCO2, Ven: 46.9 mmHg (ref 44.0–60.0)
pH, Ven: 7.329 (ref 7.250–7.430)
pO2, Ven: 46 mmHg — ABNORMAL HIGH (ref 32.0–45.0)

## 2018-11-18 LAB — BASIC METABOLIC PANEL
Anion gap: 10 (ref 5–15)
BUN: 35 mg/dL — ABNORMAL HIGH (ref 6–20)
CO2: 23 mmol/L (ref 22–32)
Calcium: 9.1 mg/dL (ref 8.9–10.3)
Chloride: 106 mmol/L (ref 98–111)
Creatinine, Ser: 1.89 mg/dL — ABNORMAL HIGH (ref 0.61–1.24)
GFR calc Af Amer: 44 mL/min — ABNORMAL LOW (ref >60)
GFR calc non Af Amer: 38 mL/min — ABNORMAL LOW (ref >60)
Glucose, Bld: 91 mg/dL (ref 70–99)
Potassium: 4.3 mmol/L (ref 3.5–5.1)
Sodium: 139 mmol/L (ref 135–145)

## 2018-11-18 LAB — POCT I-STAT 7, (LYTES, BLD GAS, ICA,H+H)
Acid-base deficit: 2 mmol/L (ref 0.0–2.0)
Bicarbonate: 23.6 mmol/L (ref 20.0–28.0)
Calcium, Ion: 1.31 mmol/L (ref 1.15–1.40)
HCT: 40 % (ref 39.0–52.0)
Hemoglobin: 13.6 g/dL (ref 13.0–17.0)
O2 Saturation: 99 %
Potassium: 4.3 mmol/L (ref 3.5–5.1)
Sodium: 145 mmol/L (ref 135–145)
TCO2: 25 mmol/L (ref 22–32)
pCO2 arterial: 43.1 mmHg (ref 32.0–48.0)
pH, Arterial: 7.347 — ABNORMAL LOW (ref 7.350–7.450)
pO2, Arterial: 125 mmHg — ABNORMAL HIGH (ref 83.0–108.0)

## 2018-11-18 SURGERY — RIGHT/LEFT HEART CATH AND CORONARY ANGIOGRAPHY
Anesthesia: LOCAL

## 2018-11-18 MED ORDER — ONDANSETRON HCL 4 MG/2ML IJ SOLN: 4.0000 mg | Freq: Four times a day (QID) | INTRAMUSCULAR | Status: DC | PRN

## 2018-11-18 MED ORDER — VERAPAMIL HCL 2.5 MG/ML IV SOLN
INTRAVENOUS | Status: DC | PRN
  Administered 2018-11-18: 11:00:00 10 mL via INTRA_ARTERIAL

## 2018-11-18 MED ORDER — VERAPAMIL HCL 2.5 MG/ML IV SOLN
INTRAVENOUS | Status: AC
  Filled 2018-11-18: qty 2

## 2018-11-18 MED ORDER — SODIUM CHLORIDE 0.9 % WEIGHT BASED INFUSION
3.0000 mL/kg/h | INTRAVENOUS | Status: AC
  Administered 2018-11-18: 3 mL/kg/h via INTRAVENOUS

## 2018-11-18 MED ORDER — SODIUM CHLORIDE 0.9 % IV SOLN: INTRAVENOUS | Status: AC

## 2018-11-18 MED ORDER — ACETAMINOPHEN 325 MG PO TABS: 650.0000 mg | ORAL_TABLET | ORAL | Status: DC | PRN

## 2018-11-18 MED ORDER — MIDAZOLAM HCL 2 MG/2ML IJ SOLN
INTRAMUSCULAR | Status: DC | PRN
  Administered 2018-11-18: 2 mg via INTRAVENOUS

## 2018-11-18 MED ORDER — METOPROLOL TARTRATE 5 MG/5ML IV SOLN
5.0000 mg | Freq: Once | INTRAVENOUS | Status: AC
  Administered 2018-11-18: 5 mg via INTRAVENOUS

## 2018-11-18 MED ORDER — METOPROLOL TARTRATE 5 MG/5ML IV SOLN
INTRAVENOUS | Status: AC
  Filled 2018-11-18: qty 5

## 2018-11-18 MED ORDER — SODIUM CHLORIDE 0.9 % IV SOLN: 250.0000 mL | INTRAVENOUS | Status: DC | PRN

## 2018-11-18 MED ORDER — HEPARIN (PORCINE) IN NACL 1000-0.9 UT/500ML-% IV SOLN
INTRAVENOUS | Status: AC
  Filled 2018-11-18: qty 1000

## 2018-11-18 MED ORDER — SODIUM CHLORIDE 0.9% FLUSH: 3.0000 mL | Freq: Two times a day (BID) | INTRAVENOUS | Status: DC

## 2018-11-18 MED ORDER — SODIUM CHLORIDE 0.9% FLUSH: 3.0000 mL | INTRAVENOUS | Status: DC | PRN

## 2018-11-18 MED ORDER — HYDRALAZINE HCL 20 MG/ML IJ SOLN: 10.0000 mg | INTRAMUSCULAR | Status: DC | PRN

## 2018-11-18 MED ORDER — HEPARIN SODIUM (PORCINE) 1000 UNIT/ML IJ SOLN
INTRAMUSCULAR | Status: DC | PRN
  Administered 2018-11-18: 4000 [IU] via INTRAVENOUS

## 2018-11-18 MED ORDER — LABETALOL HCL 5 MG/ML IV SOLN: 10.0000 mg | INTRAVENOUS | Status: DC | PRN

## 2018-11-18 MED ORDER — MIDAZOLAM HCL 2 MG/2ML IJ SOLN
INTRAMUSCULAR | Status: AC
  Filled 2018-11-18: qty 2

## 2018-11-18 MED ORDER — SODIUM CHLORIDE 0.9 % WEIGHT BASED INFUSION
1.0000 mL/kg/h | INTRAVENOUS | Status: DC
  Administered 2018-11-18: 1 mL/kg/h via INTRAVENOUS

## 2018-11-18 MED ORDER — LIDOCAINE HCL (PF) 1 % IJ SOLN
INTRAMUSCULAR | Status: AC
  Filled 2018-11-18: qty 30

## 2018-11-18 MED ORDER — FENTANYL CITRATE (PF) 100 MCG/2ML IJ SOLN
INTRAMUSCULAR | Status: AC
  Filled 2018-11-18: qty 2

## 2018-11-18 MED ORDER — LIDOCAINE HCL (PF) 1 % IJ SOLN
INTRAMUSCULAR | Status: DC | PRN
  Administered 2018-11-18: 1 mL
  Administered 2018-11-18: 2 mL

## 2018-11-18 MED ORDER — HEPARIN (PORCINE) IN NACL 1000-0.9 UT/500ML-% IV SOLN
INTRAVENOUS | Status: DC | PRN
  Administered 2018-11-18 (×2): 500 mL

## 2018-11-18 MED ORDER — METOPROLOL TARTRATE 5 MG/5ML IV SOLN
INTRAVENOUS | Status: DC | PRN
  Administered 2018-11-18 (×3): 5 mg via INTRAVENOUS

## 2018-11-18 MED ORDER — IOHEXOL 350 MG/ML SOLN
INTRAVENOUS | Status: DC | PRN
  Administered 2018-11-18: 11:00:00 40 mL via INTRA_ARTERIAL

## 2018-11-18 MED ORDER — FENTANYL CITRATE (PF) 100 MCG/2ML IJ SOLN
INTRAMUSCULAR | Status: DC | PRN
  Administered 2018-11-18: 25 ug via INTRAVENOUS

## 2018-11-18 MED ORDER — ASPIRIN 81 MG PO CHEW: 81.0000 mg | CHEWABLE_TABLET | ORAL | Status: DC

## 2018-11-18 SURGICAL SUPPLY — 16 items
BAG SNAP BAND KOVER 36X36 (MISCELLANEOUS) ×1 IMPLANT
BAND ZEPHYR COMPRESS 30 LONG (HEMOSTASIS) ×1 IMPLANT
CATH 5FR JL3.5 JR4 ANG PIG MP (CATHETERS) ×1 IMPLANT
CATH BALLN WEDGE 5F 110CM (CATHETERS) ×1 IMPLANT
CATH INFINITI 5FR AL1 (CATHETERS) ×1 IMPLANT
CATH INFINITI 5FR JL4 (CATHETERS) ×1 IMPLANT
COVER DOME SNAP 22 D (MISCELLANEOUS) ×1 IMPLANT
GUIDEWIRE INQWIRE 1.5J.035X260 (WIRE) IMPLANT
INQWIRE 1.5J .035X260CM (WIRE) ×2
KIT HEART LEFT (KITS) ×2 IMPLANT
PACK CARDIAC CATHETERIZATION (CUSTOM PROCEDURE TRAY) ×2 IMPLANT
SHEATH GLIDE SLENDER 4/5FR (SHEATH) ×1 IMPLANT
SHEATH RAIN RADIAL 21G 6FR (SHEATH) ×1 IMPLANT
TRANSDUCER W/STOPCOCK (MISCELLANEOUS) ×2 IMPLANT
TUBING CIL FLEX 10 FLL-RA (TUBING) ×2 IMPLANT
WIRE EMERALD ST .035X150CM (WIRE) ×1 IMPLANT

## 2018-11-18 NOTE — Progress Notes (Signed)
Patient scheduled for cardioversion on Monday.  Given all anesthesia instructions.  Called Dr Caryl Comes to find out if other instructions needed.   None given.  Pt understands all instructions.

## 2018-11-18 NOTE — Research (Signed)
PHDE Informed Consent   Subject Name: Joe Klein  Subject met inclusion and exclusion criteria.  The informed consent form, study requirements and expectations were reviewed with the subject and questions and concerns were addressed prior to the signing of the consent form.  The subject verbalized understanding of the trail requirements.  The subject agreed to participate in the PHDE trial and signed the informed consent.  The informed consent was obtained prior to performance of any protocol-specific procedures for the subject.  A copy of the signed informed consent was given to the subject and a copy was placed in the subject's medical record.  Neva Seat 11/18/2018, 9:28 AM

## 2018-11-18 NOTE — Telephone Encounter (Signed)
The patient had cath today and will be getting EKG performed on Monday and will be having a cardioversion. The nurse wanted to know if any further instructions other than the anesthesia. The patient has had updated labs.

## 2018-11-18 NOTE — Telephone Encounter (Signed)
Called and left message that EKG is MOnday at 9 am at [facility] office.

## 2018-11-18 NOTE — Telephone Encounter (Signed)
New message  Pam needs know if any additional information that she needs to give him before the patient leaves to be discharged. Please advise.

## 2018-11-18 NOTE — Interval H&P Note (Signed)
Cath Lab Visit (complete for each Cath Lab visit)  Clinical Evaluation Leading to the Procedure:   ACS: No.  Non-ACS:    Anginal Classification: CCS III  Anti-ischemic medical therapy: Minimal Therapy (1 class of medications)  Non-Invasive Test Results: No non-invasive testing performed  Prior CABG: No previous CABG   Known AS and Thoracic aortic aneurysm.   History and Physical Interval Note:  11/18/2018 10:44 AM  Joe Klein  has presented today for surgery, with the diagnosis of chf.  The various methods of treatment have been discussed with the patient and family. After consideration of risks, benefits and other options for treatment, the patient has consented to  Procedure(s): RIGHT/LEFT HEART CATH AND CORONARY ANGIOGRAPHY (N/A) as a surgical intervention.  The patient's history has been reviewed, patient examined, no change in status, stable for surgery.  I have reviewed the patient's chart and labs.  Questions were answered to the patient's satisfaction.     Larae Grooms

## 2018-11-18 NOTE — Discharge Instructions (Signed)
Radial Site Care ° °This sheet gives you information about how to care for yourself after your procedure. Your health care provider may also give you more specific instructions. If you have problems or questions, contact your health care provider. °What can I expect after the procedure? °After the procedure, it is common to have: °· Bruising and tenderness at the catheter insertion area. °Follow these instructions at home: °Medicines °· Take over-the-counter and prescription medicines only as told by your health care provider. °Insertion site care °· Follow instructions from your health care provider about how to take care of your insertion site. Make sure you: °? Wash your hands with soap and water before you change your bandage (dressing). If soap and water are not available, use hand sanitizer. °? Change your dressing as told by your health care provider. °? Leave stitches (sutures), skin glue, or adhesive strips in place. These skin closures may need to stay in place for 2 weeks or longer. If adhesive strip edges start to loosen and curl up, you may trim the loose edges. Do not remove adhesive strips completely unless your health care provider tells you to do that. °· Check your insertion site every day for signs of infection. Check for: °? Redness, swelling, or pain. °? Fluid or blood. °? Pus or a bad smell. °? Warmth. °· Do not take baths, swim, or use a hot tub until your health care provider approves. °· You may shower 24-48 hours after the procedure, or as directed by your health care provider. °? Remove the dressing and gently wash the site with plain soap and water. °? Pat the area dry with a clean towel. °? Do not rub the site. That could cause bleeding. °· Do not apply powder or lotion to the site. °Activity ° °· For 24 hours after the procedure, or as directed by your health care provider: °? Do not flex or bend the affected arm. °? Do not push or pull heavy objects with the affected arm. °? Do not  drive yourself home from the hospital or clinic. You may drive 24 hours after the procedure unless your health care provider tells you not to. °? Do not operate machinery or power tools. °· Do not lift anything that is heavier than 10 lb (4.5 kg), or the limit that you are told, until your health care provider says that it is safe. °· Ask your health care provider when it is okay to: °? Return to work or school. °? Resume usual physical activities or sports. °? Resume sexual activity. °General instructions °· If the catheter site starts to bleed, raise your arm and put firm pressure on the site. If the bleeding does not stop, get help right away. This is a medical emergency. °· If you went home on the same day as your procedure, a responsible adult should be with you for the first 24 hours after you arrive home. °· Keep all follow-up visits as told by your health care provider. This is important. °Contact a health care provider if: °· You have a fever. °· You have redness, swelling, or yellow drainage around your insertion site. °Get help right away if: °· You have unusual pain at the radial site. °· The catheter insertion area swells very fast. °· The insertion area is bleeding, and the bleeding does not stop when you hold steady pressure on the area. °· Your arm or hand becomes pale, cool, tingly, or numb. °These symptoms may represent a serious problem   that is an emergency. Do not wait to see if the symptoms will go away. Get medical help right away. Call your local emergency services (911 in the [place] Do not drive yourself to the hospital. °Summary °· After the procedure, it is common to have bruising and tenderness at the site. °· Follow instructions from your health care provider about how to take care of your radial site wound. Check the wound every day for signs of infection. °· Do not lift anything that is heavier than 10 lb (4.5 kg), or the limit that you are told, until your health care provider says  that it is safe. °This information is not intended to replace advice given to you by your health care provider. Make sure you discuss any questions you have with your health care provider. °Document Released: 05/23/2010 Document Revised: 05/26/2017 Document Reviewed: 05/26/2017 °Elsevier Patient Education © 2020 [employer] ° °

## 2018-11-18 NOTE — Discharge Summary (Addendum)
Discharge Summary    Patient ID: Joe MorrowChristopher Aboud MRN: [id number]; DOB: 02/21/1961  Admit date: 11/18/2018 Discharge date: 11/18/2018  Primary Care Provider: Crist FatVan Eyk, Jason, MD  Primary Cardiologist: Dr. Norman HerrlichBrian Munley, MD   Discharge Diagnoses    Active Problems:   Bicuspid aortic valve   Aortic stenosis   Ascending aorta enlargement (HCC)   CKD (chronic kidney disease) stage 3, GFR 30-59 ml/min [facility](HCC)   Thoracic aortic aneurysm without rupture (HCC)  Allergies No Known Allergies  Diagnostic Studies/Procedures    [facility]:  LV end diastolic pressure is normal.  There is mild aortic stenosis by gradient.  Mild, diffuse nonobstructive CAD.  Ao sat 99%, PA sat 78%, CO 6.9 L/min; CI 3.3, mean PA pressure 18 mm Hg; mean PCWP 11 mm Hg  Transient atrial fibrillation noted during the cath, which affected obtaining waveform and gradient during pullback from LV to Ao. Marland Kitchen.   Plan for surgical evaluation of aneurysm and aortic valve.    Continue preventive therapy.    History of Present Illness     Joe Klein is a 58 y.o. male with a hx of heart failure, bicuspid aortic valve aneurysm ascending aorta, hypertension with chronic kidney disease stage III and combined systolic and diastolic heart failure. He was seen by Dr. Dulce SellarMunley 10/25/2018 and CT surgery, Dr. Vickey SagesAtkins, 11/11/2018.  At his visit with CT surgery it was noted that he would require right and left heart cath followed by repeat appointment with CT surgery for preop consideration.   His echocardiogram 11/02/2018 showed an ascending aorta aneurysm with a maximum diameter of 45 mm, bicuspid aortic valve mild to moderate regurgitation, moderate to severe stenosis, and ejection fraction 60 to 65% with elevated left atrial pressures.  His peak and mean gradients were 62 and 34 mmHg with a VTI ratio of 0.35 and a normal stroke-volume index of 36 cc/m.  He was noted to be struggling with his diagnosis at his last  visit. He was concerned given his lack of insurance and therefore social service consult was placed to assess if he may qualify for Medicaid. He was having HF symptoms such as edema, shortness of breath, chest pain, palpitations or syncope however was quite apprehensive.  Plan was for elective, pre-surgical heart cath, performed on 11/18/2018.   Hospital Course   Pt underwent R/LHC on 11/18/2018 that showed LV end diastolic pressure is normal. There was mild aortic stenosis by gradient, mild, diffuse nonobstructive CAD. Ao sat 99%, PA sat 78%, CO 6.9 L/min; CI 3.3, mean PA pressure 18 mm Hg; mean PCWP 11 mm Hg. There was transient atrial fibrillation noted during the cath, which affected obtaining waveform and gradient during pullback from LV to Ao. Cath team attempted to convert to NSR with Metoprolol with fail. Plan was for DCCV after cath but the patient unknowingly ate lunch and procedure was cancelled. Plan is to have the patient quarantine over the weekend and [facility]. He will be started on Eliquis 5mg  BID with first dose starting 11/19/2018 for a total of 5 doses prior to procedure. Discharge teaching provided regarding Eliquis administration and pulse checks. 30-day supply provided to patient at discharge. At discharge, he remains in AF with a HR in the 90-110's and is asymptomatic. Spoke with Dr. Eldridge DaceVaranasi about HR. He reports that underlying rhythm is SB with rates in the 40-50 range. No BB will be given in fear that if he converted over the weekend, his rates would be too  low. Discussed that if on Monday morning he felt that he was in NSR, he should report to Dr. Hulen ShoutsMunley's office [facility]([place]) for an EKG. Patient understands and agrees.   Cath site is stable without hematoma. He has a follow up appointment with Dr. Dulce SellarMunley 12/02/2018 at 315pm and with Dr. Vickey SagesAtkins with [facility] 11/22/2018 at 330pm.    Consultants: None   The patient was seen and examined by Dr. Eldridge DaceVaranasi who feel that he  is stable and ready for discharge today, 11/18/2018.  _____________  Discharge Vitals Blood pressure (!) 146/99, pulse (!) 102, temperature 97.7 F (36.5 C), temperature source Temporal, resp. rate 18, height 6' (1.829 m), weight 81.6 kg, SpO2 98 %.  Filed Weights   11/18/18 0727  Weight: 81.6 kg   Labs & Radiologic Studies    Basic Metabolic Panel Recent Labs    40/98/1107/17/20 0928 11/18/18 1057 11/18/18 1101  NA 139 145 142  K 4.3 4.3 4.5  CL 106  --   --   CO2 23  --   --   GLUCOSE 91  --   --   BUN 35*  --   --   CREATININE 1.89*  --   --   CALCIUM 9.1  --   --    Disposition   Pt is being discharged home today in good condition.  Follow-up Plans & Appointments   Follow-up Information    Baldo DaubMunley, Brian J, MD Follow up on 12/02/2018.   Specialty: Cardiology Why: Follow up 12/02/2018 at 315pm Contact information: [address] [place] [address] [phone]        Linden DolinAtkins, Broadus Z, MD Follow up on 11/22/2018.   Specialty: Cardiothoracic Surgery Why: Follow up 11/22/2018 at 330pm Contact information: [address] [place] [address] [phone]          Discharge Instructions    Call MD for:  difficulty breathing, headache or visual disturbances   Complete by: As directed    Call MD for:  extreme fatigue   Complete by: As directed    Call MD for:  hives   Complete by: As directed    Call MD for:  persistant dizziness or light-headedness   Complete by: As directed    Call MD for:  persistant nausea and vomiting   Complete by: As directed    Call MD for:  redness, tenderness, or signs of infection (pain, swelling, redness, odor or green/yellow discharge around incision site)   Complete by: As directed    Call MD for:  severe uncontrolled pain   Complete by: As directed    Diet - low sodium heart healthy   Complete by: As directed    Discharge instructions   Complete by: As directed    Start Eliquis 5mg  (one tablet) twice daily  begining tomorrow 11/19/2018 for a total of 5 doses before Monday. You will need to monitor your HR over the weekend. If you feel that you are in a normal rhythm on Monday morning, please contact Dr. Hulen ShoutsMunley's office ([place] LOC[facility]) to go the the office for an EKG.   You will need to self quarantine over the weekend. DO NOT go to the grocery store or any other place that may put you at risk for cancellation of procedure.   No driving for 2 days. No lifting over 5 lbs for 1 week. No sexual activity for 1 week. Keep procedure site clean & dry. If you notice increased pain, swelling,  bleeding or pus, call/return!  You may shower, but no soaking baths/hot tubs/pools for 1 week.   Increase activity slowly   Complete by: As directed      Discharge Medications   Allergies as of 11/18/2018   No Known Allergies     Medication List    TAKE these medications   aspirin 81 MG EC tablet Take 81 mg by mouth daily.   atorvastatin 40 MG tablet Commonly known as: LIPITOR Take 40 mg by mouth daily.   furosemide 20 MG tablet Commonly known as: LASIX Take 20 mg by mouth daily.   labetalol 200 MG tablet Commonly known as: NORMODYNE Take 200 mg by mouth 2 (two) times daily.   lisinopril 10 MG tablet Commonly known as: ZESTRIL Take 10 mg by mouth daily.   potassium chloride 10 MEQ tablet Commonly known as: K-DUR Take 10 mEq by mouth daily.        Acute coronary syndrome (MI, NSTEMI, STEMI, etc) this admission?: No.    Outstanding Labs/Studies   None   Duration of Discharge Encounter   Greater than 30 minutes including physician time.  Signed, Kathyrn Drown, NP 11/18/2018, 4:02 PM   I have examined the patient and reviewed assessment and plan and discussed with patient.  Agree with above as stated.  D/w Dr. Bettina Gavia.  Patient has had bradycardia and beta blocker was stopped. Today, he went into AFib.  He is asymptomatic.  Multiple doses of IV metoprolol were administered but he  did not convert to NSR.  Carotid massage slowed the rate, but no conversion to NSR.    Start on Eliquis. If he feels a regular pulse, he will go to the [facility] office on Monday and see Dr. Bettina Gavia for ECG.  If he stays in AFib, will need cardioversion.  Scheduled for Monday 2PM.  Hopefully, he will convert to NSR spontaneously and not need longterm anticoagulation.   Larae Grooms

## 2018-11-18 NOTE — Telephone Encounter (Signed)
Telephone call to patient. Left message to come to [place] office for an EKG before going to the cardioversion.

## 2018-11-19 ENCOUNTER — Telehealth: Payer: Self-pay | Admitting: Nurse Practitioner

## 2018-11-19 NOTE — Telephone Encounter (Signed)
   Pt called to report that this AM his BP was trending in the 32'G systolic and he felt LH when he stood up.  HR was 64 this AM, but he still feels significant irregularity and suspects that he is still in afib (scheduled for dccv on Monday).  He has already taken his morning medications including labetalol 200mg  and lisinopril 10mg .  He is due for labetalol 200mg  again this evening.  I rec that he rest today, increase PO fluids and ok to have a salty snack.  This evening, if his SBP is < 110, he will hold his labetalol, otw, I have asked him to reduce the dose to 100mg  BID.  He was under the impression that he was only going to have to take eliquis through tomorrow morning.  I corrected him and advised that he is to remain on eliquis through his dccv and we would plan on keeping him on it for a minimum of 4 wks post-cardioversion, if not indefinitely.  Caller verbalized understanding and was grateful for the call back.  Murray Hodgkins, NP 11/19/2018, 11:11 AM

## 2018-11-21 ENCOUNTER — Other Ambulatory Visit: Payer: Self-pay

## 2018-11-21 ENCOUNTER — Encounter (HOSPITAL_COMMUNITY): Payer: Self-pay | Admitting: Interventional Cardiology

## 2018-11-21 ENCOUNTER — Ambulatory Visit (HOSPITAL_COMMUNITY)
Admission: RE | Admit: 2018-11-21 | Discharge: 2018-11-21 | Disposition: A | Discharge status: Home / Self Care | Payer: Medicaid Other | Source: Ambulatory Visit | Attending: Internal Medicine | Admitting: Internal Medicine

## 2018-11-21 ENCOUNTER — Ambulatory Visit (INDEPENDENT_AMBULATORY_CARE_PROVIDER_SITE_OTHER): Payer: Self-pay | Admitting: Cardiology

## 2018-11-21 ENCOUNTER — Encounter (HOSPITAL_COMMUNITY)
Admission: RE | Disposition: A | Discharge status: Home / Self Care | Payer: Self-pay | Source: Ambulatory Visit | Attending: Internal Medicine

## 2018-11-21 VITALS — BP 132/80 | HR 111 | Temp 97.9°F | Ht 72.0 in | Wt 178.4 lb

## 2018-11-21 DIAGNOSIS — I350 Nonrheumatic aortic (valve) stenosis: Secondary | ICD-10-CM | POA: Insufficient documentation

## 2018-11-21 DIAGNOSIS — I4819 Other persistent atrial fibrillation: Secondary | ICD-10-CM

## 2018-11-21 DIAGNOSIS — Z7982 Long term (current) use of aspirin: Secondary | ICD-10-CM | POA: Insufficient documentation

## 2018-11-21 DIAGNOSIS — I4891 Unspecified atrial fibrillation: Secondary | ICD-10-CM

## 2018-11-21 DIAGNOSIS — Z8249 Family history of ischemic heart disease and other diseases of the circulatory system: Secondary | ICD-10-CM | POA: Diagnosis not present

## 2018-11-21 DIAGNOSIS — I509 Heart failure, unspecified: Secondary | ICD-10-CM | POA: Insufficient documentation

## 2018-11-21 DIAGNOSIS — N189 Chronic kidney disease, unspecified: Secondary | ICD-10-CM | POA: Diagnosis not present

## 2018-11-21 DIAGNOSIS — I130 Hypertensive heart and chronic kidney disease with heart failure and stage 1 through stage 4 chronic kidney disease, or unspecified chronic kidney disease: Secondary | ICD-10-CM | POA: Diagnosis not present

## 2018-11-21 DIAGNOSIS — Z79899 Other long term (current) drug therapy: Secondary | ICD-10-CM | POA: Insufficient documentation

## 2018-11-21 DIAGNOSIS — Z7901 Long term (current) use of anticoagulants: Secondary | ICD-10-CM | POA: Diagnosis not present

## 2018-11-21 DIAGNOSIS — I714 Abdominal aortic aneurysm, without rupture: Secondary | ICD-10-CM | POA: Insufficient documentation

## 2018-11-21 HISTORY — PX: CARDIOVERSION: EP1203

## 2018-11-21 SURGERY — CARDIOVERSION (CATH LAB)
Anesthesia: LOCAL

## 2018-11-21 MED ORDER — SODIUM CHLORIDE 0.9 % IV SOLN
INTRAVENOUS | Status: DC
  Administered 2018-11-21: 13:00:00 via INTRAVENOUS

## 2018-11-21 MED ORDER — FENTANYL CITRATE (PF) 100 MCG/2ML IJ SOLN
INTRAMUSCULAR | Status: AC
  Filled 2018-11-21: qty 2

## 2018-11-21 MED ORDER — FENTANYL CITRATE (PF) 100 MCG/2ML IJ SOLN
INTRAMUSCULAR | Status: DC | PRN
  Administered 2018-11-21: 12.5 ug via INTRAVENOUS

## 2018-11-21 MED ORDER — SODIUM CHLORIDE 0.9% FLUSH: 3.0000 mL | Freq: Two times a day (BID) | INTRAVENOUS | Status: DC

## 2018-11-21 MED ORDER — MIDAZOLAM HCL 5 MG/5ML IJ SOLN
INTRAMUSCULAR | Status: AC
  Filled 2018-11-21: qty 5

## 2018-11-21 MED ORDER — MIDAZOLAM HCL 2 MG/2ML IJ SOLN
INTRAMUSCULAR | Status: DC | PRN
  Administered 2018-11-21 (×5): 2 mg via INTRAVENOUS

## 2018-11-21 MED ORDER — SODIUM CHLORIDE 0.9% FLUSH: 3.0000 mL | INTRAVENOUS | Status: DC | PRN

## 2018-11-21 MED ORDER — SODIUM CHLORIDE 0.9 % IV SOLN: 250.0000 mL | INTRAVENOUS | Status: DC

## 2018-11-21 SURGICAL SUPPLY — 1 items: PAD PRO RADIOLUCENT 2001M-C (PAD) ×1 IMPLANT

## 2018-11-21 NOTE — Progress Notes (Signed)
Cardiology Office Note:    Date:  11/21/2018   ID:  Joe MorrowChristopher Rollins, DOB 12/15/1960, MRN [id number]  PCP:  Crist FatVan Eyk, Jason, MD  Cardiologist:  Norman HerrlichBrian , MD    Referring MD: Leonia ReaderVan Eyk, Barbara CowerJason, MD    ASSESSMENT:    1. Other persistent atrial fibrillation    PLAN:    In order of problems listed above:  1. Remains in atrial fibrillation symptomatic plan for cardioversion of 12 noon he will see CT surgery tomorrow remains anticoagulant until the Dr. Eldridge Dacevaranasi had discussions with Dr. Graciela HusbandsKlein EP from unsure if there is any plan for antiarrhythmic drug therapy like amiodarone with his high risk of recurrence with open heart surgery.  His coronary angiography was normal heart catheterization confirmed severe aortic stenosis and is pending scheduling of his surgical intervention.  I suspect once atrial fibrillation resolved hypertension be stable and continue his current medications including ACE inhibitor beta-blocker diuretic.   Next appointment: After cardiac interventions   Medication Adjustments/Labs and Tests Ordered: Current medicines are reviewed at length with the patient today.  Concerns regarding medicines are outlined above.  Orders Placed This Encounter  Procedures  . EKG 12-Lead   No orders of the defined types were placed in this encounter.   Chief Complaint  Patient presents with  . Atrial Fibrillation    History of Present Illness:    Joe Klein is a 58 y.o. male with a hx of severe symptomatic aortic stenosis aortic aneurysm hypertension CKD and new onset atrial fibrillation after heart catheterization on Friday last seen by me prior to coronary angiography.  Over the weekend felt poorly at one point his systolic blood pressure less than 90 held his beta-blocker then had rapid atrial fibrillation and resumed.  Today he does not feel well.  Blood pressure by me in the office 132/80.  No edema shortness of breath syncope chest pain.  He is scheduled for  cardioversion 12 noon.  He is seeing CT surgery tomorrow morning and is now anticoagulated Compliance with diet, lifestyle and medications: Yes Past Medical History:  Diagnosis Date  . AAA (abdominal aortic aneurysm) (HCC)   . Aortic stenosis   . CHF (congestive heart failure) (HCC)   . Chronic kidney disease    stage 3  . Hypertension     Past Surgical History:  Procedure Laterality Date  . MANDIBLE FRACTURE SURGERY    . RIGHT/LEFT HEART CATH AND CORONARY ANGIOGRAPHY N/A 11/18/2018   Procedure: RIGHT/LEFT HEART CATH AND CORONARY ANGIOGRAPHY;  Surgeon: Corky CraftsVaranasi, Jayadeep S, MD;  [facility];  Service: Cardiovascular;  Laterality: N/A;  . TONSILLECTOMY      Current Medications: Current Meds  Medication Sig  . apixaban (ELIQUIS) 5 MG TABS tablet Take 5 mg by mouth 2 (two) times daily.  Marland Kitchen. aspirin 81 MG EC tablet Take 81 mg by mouth daily.   Marland Kitchen. atorvastatin (LIPITOR) 40 MG tablet Take 40 mg by mouth daily.  . furosemide (LASIX) 20 MG tablet Take 20 mg by mouth daily.  Marland Kitchen. labetalol (NORMODYNE) 200 MG tablet Take 200 mg by mouth 2 (two) times daily.  Marland Kitchen. lisinopril (ZESTRIL) 10 MG tablet Take 10 mg by mouth daily.   . potassium chloride (K-DUR) 10 MEQ tablet Take 10 mEq by mouth daily.     Allergies:   Patient has no known allergies.   Social History   Socioeconomic History  . Marital status: Single    Spouse name: Not on file  . Number  of children: Not on file  . Years of education: Not on file  . Highest education level: Not on file  Occupational History  . Not on file  Social Needs  . Financial resource strain: Not on file  . Food insecurity    Worry: Not on file    Inability: Not on file  . Transportation needs    Medical: Not on file    Non-medical: Not on file  Tobacco Use  . Smoking status: Never Smoker  . Smokeless tobacco: Never Used  Substance and Sexual Activity  . Alcohol use: Never    Frequency: Never  . Drug use: Never  . Sexual activity:  Not on file  Lifestyle  . Physical activity    Days per week: Not on file    Minutes per session: Not on file  . Stress: Not on file  Relationships  . Social Herbalist on phone: Not on file    Gets together: Not on file    Attends religious service: Not on file    Active member of club or organization: Not on file    Attends meetings of clubs or organizations: Not on file    Relationship status: Not on file  Other Topics Concern  . Not on file  Social History Narrative  . Not on file     Family History: The patient's family history includes Cancer in his father; Heart disease in his mother; Hypertension in his brother and mother. ROS:   Please see the history of present illness.    All other systems reviewed and are negative.  EKGs/Labs/Other Studies Reviewed:    The following studies were reviewed today:  EKG:  EKG ordered today and personally reviewed.  The ekg ordered today demonstrates left bundle branch block atrial fibrillation rapid rate 111 bpm  Recent Labs: 11/15/2018: Platelets 202 11/18/2018: BUN 35; Creatinine, Ser 1.89; Hemoglobin 13.6; Potassium 4.5; Sodium 142  Recent Lipid Panel No results found for: CHOL, TRIG, HDL, CHOLHDL, VLDL, LDLCALC, LDLDIRECT  Physical Exam:    VS:  BP 132/80   Pulse (!) 111   Temp 97.9 F (36.6 C)   Ht 6' (1.829 m)   Wt 178 lb 6.4 oz (80.9 kg)   SpO2 100%   BMI 24.20 kg/m     Wt Readings from Last 3 Encounters:  11/21/18 178 lb 6.4 oz (80.9 kg)  11/18/18 180 lb (81.6 kg)  11/15/18 182 lb (82.6 kg)     GEN: Anxious well nourished, well developed in no acute distress HEENT: Normal NECK: No JVD; No carotid bruits LYMPHATICS: No lymphadenopathy CARDIAC: Irregular rhythm variable first heart sound 3 of 6 harsh aortic stenosis murmur encompassingno, rubs, gallops RESPIRATORY:  Clear to auscultation without rales, wheezing or rhonchi  ABDOMEN: Soft, non-tender, non-distended MUSCULOSKELETAL:  No edema; No  deformity  SKIN: Warm and dry NEUROLOGIC:  Alert and oriented x 3 PSYCHIATRIC:  Normal affect    Signed, Shirlee More, MD  11/21/2018 9:37 AM    [place]

## 2018-11-21 NOTE — Patient Instructions (Signed)
Medication Instructions:  Your physician recommends that you continue on your current medications as directed. Please refer to the Current Medication list given to you today.  If you need a refill on your cardiac medications before your next appointment, please call your pharmacy.   Lab work: None If you have labs (blood work) drawn today and your tests are completely normal, you will receive your results only by: Marland Kitchen MyChart Message (if you have MyChart) OR . A paper copy in the mail If you have any lab test that is abnormal or we need to change your treatment, we will call you to review the results.  Testing/Procedures: YOu had an EKG today  Follow-Up: At [facility], you and your health needs are our priority.  As part of our continuing mission to provide you with exceptional heart care, we have created designated Provider Care Teams.  These Care Teams include your primary Cardiologist (physician) and Advanced Practice Providers (APPs -  Physician Assistants and Nurse Practitioners) who all work together to provide you with the care you need, when you need it. You will need a follow up appointment as needed after surgery Any Other Special Instructions Will Be Listed Below (If Applicable).

## 2018-11-21 NOTE — H&P (Signed)
Date:  11/21/2018   ID:  Joe MorrowChristopher Kayes, DOB 01/20/1961, MRN [id number]  PCP:  Crist FatVan Eyk, Jason, MD     Cardiologist:  Norman HerrlichBrian Munley, MD    Referring MD: Leonia ReaderVan Eyk, Barbara CowerJason, MD    ASSESSMENT:    1. Other persistent atrial fibrillation    PLAN:    In order of problems listed above:  1. Remains in atrial fibrillation symptomatic plan for cardioversion of 12 noon he will see CT surgery tomorrow remains anticoagulant until the Dr. Eldridge Dacevaranasi had discussions with Dr. Graciela HusbandsKlein EP from unsure if there is any plan for antiarrhythmic drug therapy like amiodarone with his high risk of recurrence with open heart surgery.  His coronary angiography was normal heart catheterization confirmed severe aortic stenosis and is pending scheduling of his surgical intervention.  I suspect once atrial fibrillation resolved hypertension be stable and continue his current medications including ACE inhibitor beta-blocker diuretic.   Next appointment: After cardiac interventions   Medication Adjustments/Labs and Tests Ordered: Current medicines are reviewed at length with the patient today.  Concerns regarding medicines are outlined above.     Orders Placed This Encounter  Procedures  . EKG 12-Lead   No orders of the defined types were placed in this encounter.      Chief Complaint  Patient presents with  . Atrial Fibrillation    History of Present Illness:    Joe Klein is a 58 y.o. male with a hx of severe symptomatic aortic stenosis aortic aneurysm hypertension CKD and new onset atrial fibrillation after heart catheterization on Friday last seen by me prior to coronary angiography.  Over the weekend felt poorly at one point his systolic blood pressure less than 90 held his beta-blocker then had rapid atrial fibrillation and resumed.  Today he does not feel well.  Blood pressure by me in the office 132/80.  No edema shortness of breath syncope chest pain.  He is scheduled for  cardioversion 12 noon.  He is seeing CT surgery tomorrow morning and is now anticoagulated Compliance with diet, lifestyle and medications: Yes     Past Medical History:  Diagnosis Date  . AAA (abdominal aortic aneurysm) (HCC)   . Aortic stenosis   . CHF (congestive heart failure) (HCC)   . Chronic kidney disease    stage 3  . Hypertension     Past Surgical History:  Procedure Laterality Date  . MANDIBLE FRACTURE SURGERY    . RIGHT/LEFT HEART CATH AND CORONARY ANGIOGRAPHY N/A 11/18/2018   Procedure: RIGHT/LEFT HEART CATH AND CORONARY ANGIOGRAPHY;  Surgeon: Corky CraftsVaranasi, Jayadeep S, MD;  [facility];  Service: Cardiovascular;  Laterality: N/A;  . TONSILLECTOMY      Current Medications: Active Medications      Current Meds  Medication Sig  . apixaban (ELIQUIS) 5 MG TABS tablet Take 5 mg by mouth 2 (two) times daily.  Marland Kitchen. aspirin 81 MG EC tablet Take 81 mg by mouth daily.   Marland Kitchen. atorvastatin (LIPITOR) 40 MG tablet Take 40 mg by mouth daily.  . furosemide (LASIX) 20 MG tablet Take 20 mg by mouth daily.  Marland Kitchen. labetalol (NORMODYNE) 200 MG tablet Take 200 mg by mouth 2 (two) times daily.  Marland Kitchen. lisinopril (ZESTRIL) 10 MG tablet Take 10 mg by mouth daily.   . potassium chloride (K-DUR) 10 MEQ tablet Take 10 mEq by mouth daily.       Allergies:   Patient has no known allergies.   Social History  Socioeconomic History  . Marital status: Single    Spouse name: Not on file  . Number of children: Not on file  . Years of education: Not on file  . Highest education level: Not on file  Occupational History  . Not on file  Social Needs  . Financial resource strain: Not on file  . Food insecurity    Worry: Not on file    Inability: Not on file  . Transportation needs    Medical: Not on file    Non-medical: Not on file  Tobacco Use  . Smoking status: Never Smoker  . Smokeless tobacco: Never Used  Substance and Sexual Activity  . Alcohol use:  Never    Frequency: Never  . Drug use: Never  . Sexual activity: Not on file  Lifestyle  . Physical activity    Days per week: Not on file    Minutes per session: Not on file  . Stress: Not on file  Relationships  . Social Musicianconnections    Talks on phone: Not on file    Gets together: Not on file    Attends religious service: Not on file    Active member of club or organization: Not on file    Attends meetings of clubs or organizations: Not on file    Relationship status: Not on file  Other Topics Concern  . Not on file  Social History Narrative  . Not on file     Family History: The patient's family history includes Cancer in his father; Heart disease in his mother; Hypertension in his brother and mother. ROS:   Please see the history of present illness.    All other systems reviewed and are negative.  EKGs/Labs/Other Studies Reviewed:    The following studies were reviewed today:  EKG:  EKG ordered today and personally reviewed.  The ekg ordered today demonstrates left bundle branch block atrial fibrillation rapid rate 111 bpm  Recent Labs: 11/15/2018: Platelets 202 11/18/2018: BUN 35; Creatinine, Ser 1.89; Hemoglobin 13.6; Potassium 4.5; Sodium 142  Recent Lipid Panel Labs (Brief)  No results found for: CHOL, TRIG, HDL, CHOLHDL, VLDL, LDLCALC, LDLDIRECT    Physical Exam:    VS:  BP 132/80   Pulse (!) 111   Temp 97.9 F (36.6 C)   Ht 6' (1.829 m)   Wt 178 lb 6.4 oz (80.9 kg)   SpO2 100%   BMI 24.20 kg/m        Wt Readings from Last 3 Encounters:  11/21/18 178 lb 6.4 oz (80.9 kg)  11/18/18 180 lb (81.6 kg)  11/15/18 182 lb (82.6 kg)     GEN: Anxious well nourished, well developed in no acute distress HEENT: Normal NECK: No JVD; No carotid bruits LYMPHATICS: No lymphadenopathy CARDIAC: Irregular rhythm variable first heart sound 3 of 6 harsh aortic stenosis murmur encompassingno, rubs, gallops RESPIRATORY:  Clear to  auscultation without rales, wheezing or rhonchi  ABDOMEN: Soft, non-tender, non-distended MUSCULOSKELETAL:  No edema; No deformity  SKIN: Warm and dry NEUROLOGIC:  Alert and oriented x 3 PSYCHIATRIC:  Normal affect    Signed, Norman HerrlichBrian Munley, MD  11/21/2018 9:37 AM    [facility]   EP Attending  Patient seen and examined. Agree with above. The patient presents today for DCCV. He has not missed any of his systemic anti-coagulation and feels poorly in atrial fib which has been present for 3 days. He has AS which will make sedation a bit more difficult.  We will use versed but not fentanyl as these patients are often pre-load dependent.   Mikle Bosworth.D.

## 2018-11-21 NOTE — Discharge Instructions (Signed)

## 2018-11-22 ENCOUNTER — Encounter (HOSPITAL_COMMUNITY): Payer: Self-pay | Admitting: Internal Medicine

## 2018-11-22 ENCOUNTER — Ambulatory Visit: Payer: Self-pay | Admitting: Cardiothoracic Surgery

## 2018-11-23 ENCOUNTER — Ambulatory Visit (INDEPENDENT_AMBULATORY_CARE_PROVIDER_SITE_OTHER): Payer: Self-pay | Admitting: Cardiothoracic Surgery

## 2018-11-23 ENCOUNTER — Encounter: Payer: Self-pay | Admitting: Cardiothoracic Surgery

## 2018-11-23 ENCOUNTER — Other Ambulatory Visit: Payer: Self-pay

## 2018-11-23 VITALS — BP 160/69 | HR 52 | Temp 97.8°F | Resp 20 | Ht 72.0 in | Wt 180.0 lb

## 2018-11-23 DIAGNOSIS — I350 Nonrheumatic aortic (valve) stenosis: Secondary | ICD-10-CM

## 2018-11-23 DIAGNOSIS — I712 Thoracic aortic aneurysm, without rupture: Secondary | ICD-10-CM

## 2018-11-23 DIAGNOSIS — I7121 Aneurysm of the ascending aorta, without rupture: Secondary | ICD-10-CM

## 2018-11-23 NOTE — Progress Notes (Signed)
      [address]       [place] [address]             [phone]     CARDIOTHORACIC SURGERY OFFICE NOTE  Referring Provider is Townsend Roger, MD Primary Cardiologist is No primary care provider on file. PCP is Nona Dell, Corene Cornea, MD   HPI:  [name] returns for repeat evaluation status post left heart catheterization.  He is a 58 year old gentleman with a recent episode of congestive heart failure and new diagnosis of bicuspid aortic valve with moderate to severe aortic stenosis.  In addition he has an ascending aortic aneurysm measuring approximately 4.7 cm.  He experienced a bout of atrial fibrillation during the left heart catheterization and ultimately had to undergo cardioversion.  Otherwise he has been doing well.   Current Outpatient Medications  Medication Sig Dispense Refill  . apixaban (ELIQUIS) 5 MG TABS tablet Take 5 mg by mouth 2 (two) times daily.    Marland Kitchen aspirin 81 MG EC tablet Take 81 mg by mouth daily.     Marland Kitchen atorvastatin (LIPITOR) 40 MG tablet Take 40 mg by mouth daily.    . furosemide (LASIX) 20 MG tablet Take 20 mg by mouth daily.    Marland Kitchen labetalol (NORMODYNE) 200 MG tablet Take 200 mg by mouth 2 (two) times daily.    Marland Kitchen lisinopril (ZESTRIL) 10 MG tablet Take 10 mg by mouth daily.     . potassium chloride (K-DUR) 10 MEQ tablet Take 10 mEq by mouth daily.     No current facility-administered medications for this visit.       Physical Exam:   BP (!) 160/69   Pulse (!) 52   Temp 97.8 F (36.6 C) (Skin)   Resp 20   Ht 6' (1.829 m)   Wt 81.6 kg   SpO2 97%   BMI 24.41 kg/m   General:  Well-appearing,NAD  Chest:   cta  CV:   Rrr, 2/6 SEM RSB  Abdomen:  sntnd  Extremities:  No edema  Diagnostic Tests: LHC/RHC reviewed    Impression:  58 yo male with CHF, LV hypertrophy, atrial fibrillation, bicuspid aortic valve disease with mod-severe AS, and ascending aneurysm.   Plan:  Surgical repair in the next few weeks.   I spent in excess  of 30 minutes during the conduct of this office consultation and >50% of this time involved direct face-to-face encounter with the patient for counseling and/or coordination of their care.  Level 2                 10 minutes Level 3                 15 minutes Level 4                 25 minutes Level 5                 40 minutes  Joe Klein Z. Orvan Seen, MD 11/23/2018 2:29 PM

## 2018-11-29 ENCOUNTER — Encounter: Payer: Self-pay | Admitting: *Deleted

## 2018-11-29 ENCOUNTER — Telehealth: Payer: Self-pay | Admitting: Cardiology

## 2018-11-29 ENCOUNTER — Other Ambulatory Visit: Payer: Self-pay | Admitting: *Deleted

## 2018-11-29 DIAGNOSIS — I350 Nonrheumatic aortic (valve) stenosis: Secondary | ICD-10-CM

## 2018-11-29 DIAGNOSIS — I712 Thoracic aortic aneurysm, without rupture, unspecified: Secondary | ICD-10-CM

## 2018-11-29 NOTE — Telephone Encounter (Signed)
Patient is having Aortic Valve Replacement surgery 12/14/2018 and wants to know if he needs to keep his appt 12/02/2018  Pease call patient to discuss

## 2018-11-30 NOTE — Telephone Encounter (Signed)
Patient aware that he does not need to see Dr Bettina Gavia prior to aortic valve replacement.  He was advised that we will see him after surgery.  Patient agreed to plan and verbalized understanding.  No further questions.

## 2018-11-30 NOTE — Telephone Encounter (Signed)
No need to see me

## 2018-11-30 NOTE — Telephone Encounter (Signed)
Please advise. Thanks.  

## 2018-12-02 ENCOUNTER — Ambulatory Visit: Payer: Self-pay | Admitting: Cardiology

## 2018-12-07 ENCOUNTER — Telehealth: Payer: Self-pay | Admitting: Cardiology

## 2018-12-07 NOTE — Telephone Encounter (Signed)
Patient went to see his PCP this morning and his blood pressure was elevated (he couldn't recall exact numbers). His PCP increased his lisinopril from 10 mg daily to 20 mg daily. Patient is having surgery next week and just wanted to make sure that Dr. Bettina Gavia agrees with this medication change. Please advise.

## 2018-12-07 NOTE — Telephone Encounter (Signed)
Wants to discuss some new medication his PCP put him on

## 2018-12-07 NOTE — Telephone Encounter (Signed)
Telephone call to patient. Left message that Dr.Munley is OK with dosage change on lisinopril and to call if any further questions.

## 2018-12-07 NOTE — Telephone Encounter (Signed)
OK 

## 2018-12-09 NOTE — Pre-Procedure Instructions (Signed)
Joe MorrowChristopher Klein  12/09/2018      [facility] [address] - [place], [place] - [id number] [place] [id number] Phone: [phone] Fax: [phone]    Your procedure is scheduled on December 14, 2018.  [facility] at 630 AM.  Call this number if you have problems the morning of surgery:  [phone]   Remember:  Do not eat or drink after midnight.    Take these medicines the morning of surgery with A SIP OF WATER  Labetalol (Normodyne)  Follow your surgeon's instructions on when to hold/resume Eliquis and aspirin.  If no instructions were given call the office to determine how they would like to you take Eliquis and aspirin   7 days prior to surgery STOP taking any Aleve, Naproxen, Ibuprofen, Motrin, Advil, Goody's, BC's, all herbal medications, fish oil, and all vitamins   Day of surgery:  Do not wear jewelry  Do not wear lotions, powders, or colognes, or deodorant.  Men may shave face and neck.  Do not bring valuables to the hospital.  [facility] is not responsible for any belongings or valuables.  IF you are a smoker, DO NOT Smoke 24 hours prior to surgery   IF you wear a CPAP at night please bring your mask, tubing, and machine the morning of surgery    Remember that you must have someone to transport you home after your surgery, and remain with you for 24 hours if you are discharged the same day.  Contacts, dentures or bridgework may not be worn into surgery.  Leave your suitcase in the car.  After surgery it may be brought to your room.  For patients admitted to the hospital, discharge time will be determined by your treatment team.  Patients discharged the day of surgery will not be allowed to drive home.    [place]- Preparing For Surgery  Before surgery, you can play an important role. Because skin is not sterile, your skin needs to be as free of germs as possible. You can reduce the number of  germs on your skin by washing with CHG (chlorahexidine gluconate) Soap before surgery.  CHG is an antiseptic cleaner which kills germs and bonds with the skin to continue killing germs even after washing.    Oral Hygiene is also important to reduce your risk of infection.  Remember - BRUSH YOUR TEETH THE MORNING OF SURGERY WITH YOUR REGULAR TOOTHPASTE  Please do not use if you have an allergy to CHG or antibacterial soaps. If your skin becomes reddened/irritated stop using the CHG.  Do not shave (including legs and underarms) for at least 48 hours prior to first CHG shower. It is OK to shave your face.  Please follow these instructions carefully.   1. Shower the NIGHT BEFORE SURGERY and the MORNING OF SURGERY with CHG.   2. If you chose to wash your hair, wash your hair first as usual with your normal shampoo.  3. After you shampoo, rinse your hair and body thoroughly to remove the shampoo.  4. Use CHG as you would any other liquid soap. You can apply CHG directly to the skin and wash gently with a scrungie or a clean washcloth.   5. Apply the CHG Soap to your body ONLY FROM THE NECK DOWN.  Do not use on open wounds or open sores. Avoid contact with your eyes, ears, mouth and genitals (private parts). Wash Face and genitals (  private parts)  with your normal soap.  6. Wash thoroughly, paying special attention to the area where your surgery will be performed.  7. Thoroughly rinse your body with warm water from the neck down.  8. DO NOT shower/wash with your normal soap after using and rinsing off the CHG Soap.  9. Pat yourself dry with a CLEAN TOWEL.  10. Wear CLEAN PAJAMAS to bed the night before surgery, wear comfortable clothes the morning of surgery  11. Place CLEAN SHEETS on your bed the night of your first shower and DO NOT SLEEP WITH PETS.  Day of Surgery: Shower as above Do not apply any deodorants/lotions.  Please wear clean clothes to the hospital/surgery center.   Remember  to brush your teeth WITH YOUR REGULAR TOOTHPASTE.  Please read over the following fact sheets that you were given.

## 2018-12-12 ENCOUNTER — Encounter (HOSPITAL_COMMUNITY)
Admission: RE | Admit: 2018-12-12 | Discharge: 2018-12-12 | Disposition: A | Discharge status: Home / Self Care | Payer: Medicaid Other | Source: Ambulatory Visit | Attending: Cardiothoracic Surgery | Admitting: Cardiothoracic Surgery

## 2018-12-12 ENCOUNTER — Ambulatory Visit (HOSPITAL_COMMUNITY)
Admission: RE | Admit: 2018-12-12 | Discharge: 2018-12-12 | Disposition: A | Discharge status: Home / Self Care | Payer: Medicaid Other | Source: Ambulatory Visit | Attending: Cardiothoracic Surgery | Admitting: Cardiothoracic Surgery

## 2018-12-12 ENCOUNTER — Other Ambulatory Visit (HOSPITAL_COMMUNITY)
Admission: RE | Admit: 2018-12-12 | Discharge: 2018-12-12 | Disposition: A | Discharge status: Home / Self Care | Payer: Medicaid Other | Source: Ambulatory Visit | Attending: Cardiothoracic Surgery | Admitting: Cardiothoracic Surgery

## 2018-12-12 ENCOUNTER — Encounter (HOSPITAL_COMMUNITY): Payer: Self-pay

## 2018-12-12 ENCOUNTER — Other Ambulatory Visit: Payer: Self-pay

## 2018-12-12 DIAGNOSIS — I712 Thoracic aortic aneurysm, without rupture, unspecified: Secondary | ICD-10-CM

## 2018-12-12 DIAGNOSIS — Z01818 Encounter for other preprocedural examination: Secondary | ICD-10-CM | POA: Diagnosis present

## 2018-12-12 DIAGNOSIS — I350 Nonrheumatic aortic (valve) stenosis: Secondary | ICD-10-CM | POA: Insufficient documentation

## 2018-12-12 DIAGNOSIS — Z20828 Contact with and (suspected) exposure to other viral communicable diseases: Secondary | ICD-10-CM | POA: Insufficient documentation

## 2018-12-12 HISTORY — DX: Cardiac arrhythmia, unspecified: I49.9

## 2018-12-12 LAB — COMPREHENSIVE METABOLIC PANEL
ALT: 32 U/L (ref 0–44)
AST: 20 U/L (ref 15–41)
Albumin: 3.6 g/dL (ref 3.5–5.0)
Alkaline Phosphatase: 51 U/L (ref 38–126)
Anion gap: 11 (ref 5–15)
BUN: 32 mg/dL — ABNORMAL HIGH (ref 6–20)
CO2: 21 mmol/L — ABNORMAL LOW (ref 22–32)
Calcium: 9.4 mg/dL (ref 8.9–10.3)
Chloride: 106 mmol/L (ref 98–111)
Creatinine, Ser: 2.03 mg/dL — ABNORMAL HIGH (ref 0.61–1.24)
GFR calc Af Amer: 40 mL/min — ABNORMAL LOW (ref >60)
GFR calc non Af Amer: 35 mL/min — ABNORMAL LOW (ref >60)
Glucose, Bld: 112 mg/dL — ABNORMAL HIGH (ref 70–99)
Potassium: 4.5 mmol/L (ref 3.5–5.1)
Sodium: 138 mmol/L (ref 135–145)
Total Bilirubin: 0.5 mg/dL (ref 0.3–1.2)
Total Protein: 7.8 g/dL (ref 6.5–8.1)

## 2018-12-12 LAB — HEMOGLOBIN A1C
Hgb A1c MFr Bld: 5.9 % — ABNORMAL HIGH (ref 4.8–5.6)
Mean Plasma Glucose: 122.63 mg/dL

## 2018-12-12 LAB — BLOOD GAS, ARTERIAL
Acid-base deficit: 0.1 mmol/L (ref 0.0–2.0)
Bicarbonate: 23.9 mmol/L (ref 20.0–28.0)
Drawn by: 421801
FIO2: 0.21
O2 Saturation: 97.9 %
Patient temperature: 98.6
pCO2 arterial: 38.4 mmHg (ref 32.0–48.0)
pH, Arterial: 7.411 (ref 7.350–7.450)
pO2, Arterial: 102 mmHg (ref 83.0–108.0)

## 2018-12-12 LAB — URINALYSIS, ROUTINE W REFLEX MICROSCOPIC
Bilirubin Urine: NEGATIVE
Glucose, UA: NEGATIVE mg/dL
Hgb urine dipstick: NEGATIVE
Ketones, ur: NEGATIVE mg/dL
Leukocytes,Ua: NEGATIVE
Nitrite: NEGATIVE
Protein, ur: NEGATIVE mg/dL
Specific Gravity, Urine: 1.009 (ref 1.005–1.030)
pH: 5 (ref 5.0–8.0)

## 2018-12-12 LAB — CBC
HCT: 42.1 % (ref 39.0–52.0)
Hemoglobin: 13.3 g/dL (ref 13.0–17.0)
MCH: 27.8 pg (ref 26.0–34.0)
MCHC: 31.6 g/dL (ref 30.0–36.0)
MCV: 88.1 fL (ref 80.0–100.0)
Platelets: 176 10*3/uL (ref 150–400)
RBC: 4.78 MIL/uL (ref 4.22–5.81)
RDW: 13.7 % (ref 11.5–15.5)
WBC: 6.6 10*3/uL (ref 4.0–10.5)
nRBC: 0 % (ref 0.0–0.2)

## 2018-12-12 LAB — PROTIME-INR
INR: 1.2 (ref 0.8–1.2)
Prothrombin Time: 14.6 seconds (ref 11.4–15.2)

## 2018-12-12 LAB — SURGICAL PCR SCREEN
MRSA, PCR: NEGATIVE
Staphylococcus aureus: NEGATIVE

## 2018-12-12 LAB — APTT: aPTT: 32 seconds (ref 24–36)

## 2018-12-12 LAB — ABO/RH: ABO/RH(D): A POS

## 2018-12-12 LAB — SARS CORONAVIRUS 2 (TAT 6-24 HRS): SARS Coronavirus 2: NEGATIVE

## 2018-12-12 NOTE — Progress Notes (Addendum)
PCP - Cyndia Diver Eyk @ [facility] in [place]  Chest x-ray - today EKG - 11/21/18 Stress Test -  ECHO - 7/20 Cardiac Cath - 7/20  Sleep Study - na CPAP -   Fasting Blood Sugar - na Checks Blood Sugar _____ times a day  Blood Thinner Instructions: stopped Eliquis 12/08/18 Aspirin Instructions:  Anesthesia review:   Patient denies shortness of breath, fever, cough and chest pain at PAT appointment   Patient verbalized understanding of instructions that were given to them at the PAT appointment. Patient was also instructed that they will need to review over the PAT instructions again at home before surgery.  Left message for Levonne Spiller of pt's abnormal CMET results.

## 2018-12-12 NOTE — Progress Notes (Signed)
VASCULAR LAB PRELIMINARY  PRELIMINARY  PRELIMINARY  PRELIMINARY  Pre CABG duplex completed.    Preliminary report:  See CV proc for preliminary results.   Dashea Mcmullan, RVT 12/12/2018, 11:53 AM

## 2018-12-13 MED ORDER — PHENYLEPHRINE HCL-NACL 20-0.9 MG/250ML-% IV SOLN
30.0000 ug/min | INTRAVENOUS | Status: AC
  Filled 2018-12-13: qty 250

## 2018-12-13 MED ORDER — MILRINONE LACTATE IN DEXTROSE 20-5 MG/100ML-% IV SOLN
0.3000 ug/kg/min | INTRAVENOUS | Status: AC
  Filled 2018-12-13: qty 100

## 2018-12-13 MED ORDER — PLASMA-LYTE 148 IV SOLN
INTRAVENOUS | Status: AC
  Filled 2018-12-13: qty 2.5

## 2018-12-13 MED ORDER — TRANEXAMIC ACID (OHS) BOLUS VIA INFUSION
15.0000 mg/kg | INTRAVENOUS | Status: AC
  Filled 2018-12-13: qty 1229

## 2018-12-13 MED ORDER — DOPAMINE-DEXTROSE 3.2-5 MG/ML-% IV SOLN
0.0000 ug/kg/min | INTRAVENOUS | Status: AC
  Filled 2018-12-13: qty 250

## 2018-12-13 MED ORDER — INSULIN REGULAR(HUMAN) IN NACL 100-0.9 UT/100ML-% IV SOLN
INTRAVENOUS | Status: AC
  Filled 2018-12-13: qty 100

## 2018-12-13 MED ORDER — SODIUM CHLORIDE 0.9 % IV SOLN
750.0000 mg | INTRAVENOUS | Status: AC
  Filled 2018-12-13: qty 750

## 2018-12-13 MED ORDER — EPINEPHRINE HCL 5 MG/250ML IV SOLN IN NS
0.0000 ug/min | INTRAVENOUS | Status: AC
  Filled 2018-12-13: qty 250

## 2018-12-13 MED ORDER — TRANEXAMIC ACID 1000 MG/10ML IV SOLN
1.5000 mg/kg/h | INTRAVENOUS | Status: AC
  Filled 2018-12-13: qty 25

## 2018-12-13 MED ORDER — TRANEXAMIC ACID (OHS) PUMP PRIME SOLUTION
2.0000 mg/kg | INTRAVENOUS | Status: AC
  Filled 2018-12-13: qty 1.64

## 2018-12-13 MED ORDER — NITROGLYCERIN IN D5W 200-5 MCG/ML-% IV SOLN
2.0000 ug/min | INTRAVENOUS | Status: AC
  Filled 2018-12-13: qty 250

## 2018-12-13 MED ORDER — MANNITOL 20 % IV SOLN
Freq: Once | INTRAVENOUS | Status: DC
  Filled 2018-12-13: qty 13

## 2018-12-13 MED ORDER — SODIUM CHLORIDE 0.9 % IV SOLN
INTRAVENOUS | Status: AC
  Filled 2018-12-13: qty 30

## 2018-12-13 MED ORDER — SODIUM CHLORIDE 0.9 % IV SOLN
1.5000 g | INTRAVENOUS | Status: AC
  Filled 2018-12-13: qty 1.5

## 2018-12-13 MED ORDER — POTASSIUM CHLORIDE 2 MEQ/ML IV SOLN
80.0000 meq | INTRAVENOUS | Status: AC
  Filled 2018-12-13: qty 40

## 2018-12-13 MED ORDER — DEXMEDETOMIDINE HCL IN NACL 400 MCG/100ML IV SOLN
0.1000 ug/kg/h | INTRAVENOUS | Status: AC
  Filled 2018-12-13: qty 100

## 2018-12-13 MED ORDER — VANCOMYCIN HCL 10 G IV SOLR
1250.0000 mg | INTRAVENOUS | Status: AC
  Filled 2018-12-13: qty 1250

## 2018-12-13 MED ORDER — VANCOMYCIN HCL 1000 MG IV SOLR
INTRAVENOUS | Status: AC
  Filled 2018-12-13: qty 1000

## 2018-12-15 ENCOUNTER — Encounter (HOSPITAL_COMMUNITY): Payer: Self-pay | Admitting: Certified Registered Nurse Anesthetist

## 2018-12-15 MED ORDER — DEXMEDETOMIDINE HCL IN NACL 400 MCG/100ML IV SOLN
0.1000 ug/kg/h | INTRAVENOUS | Status: AC
  Administered 2018-12-16: 10:00:00 .5 ug/kg/h via INTRAVENOUS
  Filled 2018-12-15: qty 100

## 2018-12-15 MED ORDER — SODIUM CHLORIDE 0.9 % IV SOLN
750.0000 mg | INTRAVENOUS | Status: AC
  Administered 2018-12-16: 750 mg via INTRAVENOUS
  Filled 2018-12-15: qty 750

## 2018-12-15 MED ORDER — SODIUM CHLORIDE 0.9 % IV SOLN
INTRAVENOUS | Status: DC
  Filled 2018-12-15: qty 30

## 2018-12-15 MED ORDER — TRANEXAMIC ACID 1000 MG/10ML IV SOLN
1.5000 mg/kg/h | INTRAVENOUS | Status: AC
  Administered 2018-12-16: 10:00:00 1.5 mg/kg/h via INTRAVENOUS
  Filled 2018-12-15: qty 25

## 2018-12-15 MED ORDER — TRANEXAMIC ACID (OHS) PUMP PRIME SOLUTION
2.0000 mg/kg | INTRAVENOUS | Status: DC
  Filled 2018-12-15: qty 1.64

## 2018-12-15 MED ORDER — DOPAMINE-DEXTROSE 3.2-5 MG/ML-% IV SOLN
0.0000 ug/kg/min | INTRAVENOUS | Status: DC
  Filled 2018-12-15: qty 250

## 2018-12-15 MED ORDER — TRANEXAMIC ACID (OHS) BOLUS VIA INFUSION
15.0000 mg/kg | INTRAVENOUS | Status: AC
  Administered 2018-12-16: 09:00:00 1228.5 mg via INTRAVENOUS
  Filled 2018-12-15: qty 1229

## 2018-12-15 MED ORDER — MANNITOL 20 % IV SOLN
INTRAVENOUS | Status: DC
  Filled 2018-12-15: qty 13

## 2018-12-15 MED ORDER — INSULIN REGULAR(HUMAN) IN NACL 100-0.9 UT/100ML-% IV SOLN
INTRAVENOUS | Status: AC
  Administered 2018-12-16: 08:00:00 .7 [IU]/h via INTRAVENOUS
  Filled 2018-12-15: qty 100

## 2018-12-15 MED ORDER — SODIUM CHLORIDE 0.9 % IV SOLN
1.5000 g | INTRAVENOUS | Status: AC
  Administered 2018-12-16: 08:00:00 1.5 g via INTRAVENOUS
  Filled 2018-12-15 (×2): qty 1.5

## 2018-12-15 MED ORDER — PLASMA-LYTE 148 IV SOLN
INTRAVENOUS | Status: DC
  Filled 2018-12-15: qty 2.5

## 2018-12-15 MED ORDER — VANCOMYCIN HCL 10 G IV SOLR
1250.0000 mg | INTRAVENOUS | Status: AC
  Administered 2018-12-16: 1250 mg via INTRAVENOUS
  Filled 2018-12-15: qty 1250

## 2018-12-15 MED ORDER — EPINEPHRINE HCL 5 MG/250ML IV SOLN IN NS
0.0000 ug/min | INTRAVENOUS | Status: DC
  Filled 2018-12-15: qty 250

## 2018-12-15 MED ORDER — MILRINONE LACTATE IN DEXTROSE 20-5 MG/100ML-% IV SOLN
0.3000 ug/kg/min | INTRAVENOUS | Status: DC
  Filled 2018-12-15: qty 100

## 2018-12-15 MED ORDER — PHENYLEPHRINE HCL-NACL 20-0.9 MG/250ML-% IV SOLN
30.0000 ug/min | INTRAVENOUS | Status: AC
  Administered 2018-12-16: 25 ug/min via INTRAVENOUS
  Filled 2018-12-15: qty 250

## 2018-12-15 MED ORDER — POTASSIUM CHLORIDE 2 MEQ/ML IV SOLN
80.0000 meq | INTRAVENOUS | Status: DC
  Filled 2018-12-15: qty 40

## 2018-12-15 MED ORDER — VANCOMYCIN HCL 1000 MG IV SOLR
INTRAVENOUS | Status: DC
  Filled 2018-12-15: qty 1000

## 2018-12-15 MED ORDER — NITROGLYCERIN IN D5W 200-5 MCG/ML-% IV SOLN
2.0000 ug/min | INTRAVENOUS | Status: AC
  Administered 2018-12-16: 11:00:00 10 ug/min via INTRAVENOUS
  Filled 2018-12-15: qty 250

## 2018-12-15 NOTE — Anesthesia Preprocedure Evaluation (Signed)
Anesthesia Evaluation  Patient identified by MRN, date of birth, ID band Patient awake    Reviewed: Allergy & Precautions, NPO status , Patient's Chart, lab work & pertinent test results, reviewed documented beta blocker date and time   Airway Mallampati: II  TM Distance: >3 FB Neck ROM: Full    Dental  (+) Dental Advisory Given   Pulmonary neg pulmonary ROS,    Pulmonary exam normal breath sounds clear to auscultation       Cardiovascular hypertension, Pt. on medications and Pt. on home beta blockers +CHF  Normal cardiovascular exam+ dysrhythmias  Rhythm:Regular Rate:Normal     Neuro/Psych negative neurological ROS  negative psych ROS   GI/Hepatic negative GI ROS, Neg liver ROS,   Endo/Other  negative endocrine ROS  Renal/GU Renal disease     Musculoskeletal negative musculoskeletal ROS (+)   Abdominal   Peds  Hematology negative hematology ROS (+)   Anesthesia Other Findings   Reproductive/Obstetrics                             Anesthesia Physical Anesthesia Plan  ASA: IV  Anesthesia Plan: General   Post-op Pain Management:    Induction: Intravenous  PONV Risk Score and Plan: 3 and Ondansetron, Dexamethasone, Treatment may vary due to age or medical condition and Midazolam  Airway Management Planned: Oral ETT  Additional Equipment: Arterial line, CVP, PA Cath, TEE, 3D TEE and Ultrasound Guidance Line Placement  Intra-op Plan: Utilization Of Total Body Hypothermia per surgeon request  Post-operative Plan: Post-operative intubation/ventilation  Informed Consent: I have reviewed the patients History and Physical, chart, labs and discussed the procedure including the risks, benefits and alternatives for the proposed anesthesia with the patient or authorized representative who has indicated his/her understanding and acceptance.     Dental advisory given  Plan Discussed with:  CRNA  Anesthesia Plan Comments:         Anesthesia Quick Evaluation

## 2018-12-16 ENCOUNTER — Other Ambulatory Visit: Payer: Self-pay

## 2018-12-16 ENCOUNTER — Inpatient Hospital Stay (HOSPITAL_COMMUNITY): Payer: Medicaid Other

## 2018-12-16 ENCOUNTER — Inpatient Hospital Stay (HOSPITAL_COMMUNITY)
Admission: RE | Admit: 2018-12-16 | Discharge: 2018-12-25 | DRG: 220 | Disposition: A | Discharge status: Home / Self Care | Payer: Medicaid Other | Attending: Cardiothoracic Surgery | Admitting: Cardiothoracic Surgery

## 2018-12-16 ENCOUNTER — Inpatient Hospital Stay (HOSPITAL_COMMUNITY): Payer: Medicaid Other | Admitting: Physician Assistant

## 2018-12-16 ENCOUNTER — Encounter (HOSPITAL_COMMUNITY)
Admission: RE | Disposition: A | Discharge status: Home / Self Care | Payer: Self-pay | Source: Home / Self Care | Attending: Cardiothoracic Surgery

## 2018-12-16 ENCOUNTER — Inpatient Hospital Stay (HOSPITAL_COMMUNITY): Payer: Medicaid Other | Admitting: Certified Registered Nurse Anesthetist

## 2018-12-16 ENCOUNTER — Encounter (HOSPITAL_COMMUNITY): Payer: Self-pay

## 2018-12-16 DIAGNOSIS — Z8679 Personal history of other diseases of the circulatory system: Secondary | ICD-10-CM

## 2018-12-16 DIAGNOSIS — J95811 Postprocedural pneumothorax: Secondary | ICD-10-CM | POA: Diagnosis not present

## 2018-12-16 DIAGNOSIS — Y838 Other surgical procedures as the cause of abnormal reaction of the patient, or of later complication, without mention of misadventure at the time of the procedure: Secondary | ICD-10-CM | POA: Diagnosis not present

## 2018-12-16 DIAGNOSIS — D62 Acute posthemorrhagic anemia: Secondary | ICD-10-CM | POA: Diagnosis not present

## 2018-12-16 DIAGNOSIS — Z952 Presence of prosthetic heart valve: Secondary | ICD-10-CM

## 2018-12-16 DIAGNOSIS — I130 Hypertensive heart and chronic kidney disease with heart failure and stage 1 through stage 4 chronic kidney disease, or unspecified chronic kidney disease: Secondary | ICD-10-CM | POA: Diagnosis present

## 2018-12-16 DIAGNOSIS — I712 Thoracic aortic aneurysm, without rupture, unspecified: Secondary | ICD-10-CM

## 2018-12-16 DIAGNOSIS — I5042 Chronic combined systolic (congestive) and diastolic (congestive) heart failure: Secondary | ICD-10-CM | POA: Diagnosis present

## 2018-12-16 DIAGNOSIS — Z9689 Presence of other specified functional implants: Secondary | ICD-10-CM

## 2018-12-16 DIAGNOSIS — I480 Paroxysmal atrial fibrillation: Secondary | ICD-10-CM | POA: Diagnosis not present

## 2018-12-16 DIAGNOSIS — Z9889 Other specified postprocedural states: Secondary | ICD-10-CM

## 2018-12-16 DIAGNOSIS — Z4682 Encounter for fitting and adjustment of non-vascular catheter: Secondary | ICD-10-CM

## 2018-12-16 DIAGNOSIS — J939 Pneumothorax, unspecified: Secondary | ICD-10-CM

## 2018-12-16 DIAGNOSIS — I973 Postprocedural hypertension: Secondary | ICD-10-CM | POA: Diagnosis not present

## 2018-12-16 DIAGNOSIS — Z09 Encounter for follow-up examination after completed treatment for conditions other than malignant neoplasm: Secondary | ICD-10-CM

## 2018-12-16 DIAGNOSIS — N183 Chronic kidney disease, stage 3 (moderate): Secondary | ICD-10-CM | POA: Diagnosis not present

## 2018-12-16 DIAGNOSIS — Q231 Congenital insufficiency of aortic valve: Secondary | ICD-10-CM | POA: Diagnosis not present

## 2018-12-16 DIAGNOSIS — I350 Nonrheumatic aortic (valve) stenosis: Secondary | ICD-10-CM

## 2018-12-16 HISTORY — PX: CLIPPING OF ATRIAL APPENDAGE: SHX5773

## 2018-12-16 HISTORY — PX: THORACIC AORTIC ANEURYSM REPAIR: SHX799

## 2018-12-16 HISTORY — PX: AORTIC VALVE REPLACEMENT: SHX41

## 2018-12-16 HISTORY — DX: Personal history of other diseases of the circulatory system: Z86.79

## 2018-12-16 HISTORY — PX: MAZE: SHX5063

## 2018-12-16 HISTORY — PX: TEE WITHOUT CARDIOVERSION: SHX5443

## 2018-12-16 LAB — HEMOGLOBIN AND HEMATOCRIT, BLOOD
HCT: 30.5 % — ABNORMAL LOW (ref 39.0–52.0)
Hemoglobin: 10.2 g/dL — ABNORMAL LOW (ref 13.0–17.0)

## 2018-12-16 LAB — POCT I-STAT 4, (NA,K, GLUC, HGB,HCT)
Glucose, Bld: 95 mg/dL (ref 70–99)
Glucose, Bld: 97 mg/dL (ref 70–99)
Glucose, Bld: 88 mg/dL (ref 70–99)
Glucose, Bld: 137 mg/dL — ABNORMAL HIGH (ref 70–99)
Glucose, Bld: 122 mg/dL — ABNORMAL HIGH (ref 70–99)
Glucose, Bld: 140 mg/dL — ABNORMAL HIGH (ref 70–99)
HCT: 36 % — ABNORMAL LOW (ref 39.0–52.0)
HCT: 35 % — ABNORMAL LOW (ref 39.0–52.0)
HCT: 30 % — ABNORMAL LOW (ref 39.0–52.0)
HCT: 31 % — ABNORMAL LOW (ref 39.0–52.0)
HCT: 20 % — ABNORMAL LOW (ref 39.0–52.0)
HCT: 26 % — ABNORMAL LOW (ref 39.0–52.0)
Hemoglobin: 12.2 g/dL — ABNORMAL LOW (ref 13.0–17.0)
Hemoglobin: 11.9 g/dL — ABNORMAL LOW (ref 13.0–17.0)
Hemoglobin: 10.2 g/dL — ABNORMAL LOW (ref 13.0–17.0)
Hemoglobin: 10.5 g/dL — ABNORMAL LOW (ref 13.0–17.0)
Hemoglobin: 6.8 g/dL — CL (ref 13.0–17.0)
Hemoglobin: 8.8 g/dL — ABNORMAL LOW (ref 13.0–17.0)
Potassium: 4.3 mmol/L (ref 3.5–5.1)
Potassium: 4.5 mmol/L (ref 3.5–5.1)
Potassium: 5.4 mmol/L — ABNORMAL HIGH (ref 3.5–5.1)
Potassium: 6.2 mmol/L — ABNORMAL HIGH (ref 3.5–5.1)
Potassium: 5.5 mmol/L — ABNORMAL HIGH (ref 3.5–5.1)
Potassium: 4.9 mmol/L (ref 3.5–5.1)
Sodium: 141 mmol/L (ref 135–145)
Sodium: 142 mmol/L (ref 135–145)
Sodium: 138 mmol/L (ref 135–145)
Sodium: 137 mmol/L (ref 135–145)
Sodium: 139 mmol/L (ref 135–145)
Sodium: 139 mmol/L (ref 135–145)

## 2018-12-16 LAB — CBC
HCT: 27.6 % — ABNORMAL LOW (ref 39.0–52.0)
HCT: 22.3 % — ABNORMAL LOW (ref 39.0–52.0)
Hemoglobin: 8.9 g/dL — ABNORMAL LOW (ref 13.0–17.0)
Hemoglobin: 7.3 g/dL — ABNORMAL LOW (ref 13.0–17.0)
MCH: 27.7 pg (ref 26.0–34.0)
MCH: 28 pg (ref 26.0–34.0)
MCHC: 32.2 g/dL (ref 30.0–36.0)
MCHC: 32.7 g/dL (ref 30.0–36.0)
MCV: 86 fL (ref 80.0–100.0)
MCV: 85.4 fL (ref 80.0–100.0)
Platelets: 72 10*3/uL — ABNORMAL LOW (ref 150–400)
Platelets: 133 10*3/uL — ABNORMAL LOW (ref 150–400)
RBC: 3.21 MIL/uL — ABNORMAL LOW (ref 4.22–5.81)
RBC: 2.61 MIL/uL — ABNORMAL LOW (ref 4.22–5.81)
RDW: 13.7 % (ref 11.5–15.5)
RDW: 13.9 % (ref 11.5–15.5)
WBC: 10.1 10*3/uL (ref 4.0–10.5)
WBC: 11.7 10*3/uL — ABNORMAL HIGH (ref 4.0–10.5)
nRBC: 0 % (ref 0.0–0.2)
nRBC: 0 % (ref 0.0–0.2)

## 2018-12-16 LAB — POCT I-STAT 7, (LYTES, BLD GAS, ICA,H+H)
Acid-base deficit: 2 mmol/L (ref 0.0–2.0)
Acid-base deficit: 3 mmol/L — ABNORMAL HIGH (ref 0.0–2.0)
Acid-base deficit: 4 mmol/L — ABNORMAL HIGH (ref 0.0–2.0)
Acid-base deficit: 2 mmol/L (ref 0.0–2.0)
Bicarbonate: 24.7 mmol/L (ref 20.0–28.0)
Bicarbonate: 23.6 mmol/L (ref 20.0–28.0)
Bicarbonate: 21.5 mmol/L (ref 20.0–28.0)
Bicarbonate: 21.7 mmol/L (ref 20.0–28.0)
Bicarbonate: 21.5 mmol/L (ref 20.0–28.0)
Calcium, Ion: 1.1 mmol/L — ABNORMAL LOW (ref 1.15–1.40)
Calcium, Ion: 1.11 mmol/L — ABNORMAL LOW (ref 1.15–1.40)
Calcium, Ion: 1.02 mmol/L — ABNORMAL LOW (ref 1.15–1.40)
Calcium, Ion: 1.04 mmol/L — ABNORMAL LOW (ref 1.15–1.40)
Calcium, Ion: 1.11 mmol/L — ABNORMAL LOW (ref 1.15–1.40)
HCT: 29 % — ABNORMAL LOW (ref 39.0–52.0)
HCT: 31 % — ABNORMAL LOW (ref 39.0–52.0)
HCT: 21 % — ABNORMAL LOW (ref 39.0–52.0)
HCT: 26 % — ABNORMAL LOW (ref 39.0–52.0)
HCT: 21 % — ABNORMAL LOW (ref 39.0–52.0)
Hemoglobin: 9.9 g/dL — ABNORMAL LOW (ref 13.0–17.0)
Hemoglobin: 10.5 g/dL — ABNORMAL LOW (ref 13.0–17.0)
Hemoglobin: 7.1 g/dL — ABNORMAL LOW (ref 13.0–17.0)
Hemoglobin: 8.8 g/dL — ABNORMAL LOW (ref 13.0–17.0)
Hemoglobin: 7.1 g/dL — ABNORMAL LOW (ref 13.0–17.0)
O2 Saturation: 100 %
O2 Saturation: 100 %
O2 Saturation: 100 %
O2 Saturation: 100 %
O2 Saturation: 100 %
Patient temperature: 36.2
Patient temperature: 36.4
Potassium: 4.4 mmol/L (ref 3.5–5.1)
Potassium: 6.3 mmol/L (ref 3.5–5.1)
Potassium: 5.6 mmol/L — ABNORMAL HIGH (ref 3.5–5.1)
Potassium: 4.9 mmol/L (ref 3.5–5.1)
Potassium: 5 mmol/L (ref 3.5–5.1)
Sodium: 141 mmol/L (ref 135–145)
Sodium: 138 mmol/L (ref 135–145)
Sodium: 139 mmol/L (ref 135–145)
Sodium: 140 mmol/L (ref 135–145)
Sodium: 142 mmol/L (ref 135–145)
TCO2: 26 mmol/L (ref 22–32)
TCO2: 25 mmol/L (ref 22–32)
TCO2: 23 mmol/L (ref 22–32)
TCO2: 23 mmol/L (ref 22–32)
TCO2: 23 mmol/L (ref 22–32)
pCO2 arterial: 47.9 mmHg (ref 32.0–48.0)
pCO2 arterial: 34 mmHg (ref 32.0–48.0)
pCO2 arterial: 35.9 mmHg (ref 32.0–48.0)
pCO2 arterial: 38 mmHg (ref 32.0–48.0)
pCO2 arterial: 31.7 mmHg — ABNORMAL LOW (ref 32.0–48.0)
pH, Arterial: 7.32 — ABNORMAL LOW (ref 7.350–7.450)
pH, Arterial: 7.45 (ref 7.350–7.450)
pH, Arterial: 7.385 (ref 7.350–7.450)
pH, Arterial: 7.361 (ref 7.350–7.450)
pH, Arterial: 7.438 (ref 7.350–7.450)
pO2, Arterial: 374 mmHg — ABNORMAL HIGH (ref 83.0–108.0)
pO2, Arterial: 266 mmHg — ABNORMAL HIGH (ref 83.0–108.0)
pO2, Arterial: 319 mmHg — ABNORMAL HIGH (ref 83.0–108.0)
pO2, Arterial: 195 mmHg — ABNORMAL HIGH (ref 83.0–108.0)
pO2, Arterial: 247 mmHg — ABNORMAL HIGH (ref 83.0–108.0)

## 2018-12-16 LAB — GLUCOSE, CAPILLARY
Glucose-Capillary: 121 mg/dL — ABNORMAL HIGH (ref 70–99)
Glucose-Capillary: 126 mg/dL — ABNORMAL HIGH (ref 70–99)
Glucose-Capillary: 126 mg/dL — ABNORMAL HIGH (ref 70–99)
Glucose-Capillary: 158 mg/dL — ABNORMAL HIGH (ref 70–99)
Glucose-Capillary: 141 mg/dL — ABNORMAL HIGH (ref 70–99)
Glucose-Capillary: 121 mg/dL — ABNORMAL HIGH (ref 70–99)

## 2018-12-16 LAB — ECHO INTRAOPERATIVE TEE

## 2018-12-16 LAB — BASIC METABOLIC PANEL
Anion gap: 9 (ref 5–15)
BUN: 34 mg/dL — ABNORMAL HIGH (ref 6–20)
CO2: 21 mmol/L — ABNORMAL LOW (ref 22–32)
Calcium: 8 mg/dL — ABNORMAL LOW (ref 8.9–10.3)
Chloride: 110 mmol/L (ref 98–111)
Creatinine, Ser: 2.05 mg/dL — ABNORMAL HIGH (ref 0.61–1.24)
GFR calc Af Amer: 40 mL/min — ABNORMAL LOW (ref >60)
GFR calc non Af Amer: 34 mL/min — ABNORMAL LOW (ref >60)
Glucose, Bld: 124 mg/dL — ABNORMAL HIGH (ref 70–99)
Potassium: 5 mmol/L (ref 3.5–5.1)
Sodium: 140 mmol/L (ref 135–145)

## 2018-12-16 LAB — MAGNESIUM: Magnesium: 3.1 mg/dL — ABNORMAL HIGH (ref 1.7–2.4)

## 2018-12-16 LAB — PROTIME-INR
INR: 1.7 — ABNORMAL HIGH (ref 0.8–1.2)
Prothrombin Time: 19.5 seconds — ABNORMAL HIGH (ref 11.4–15.2)

## 2018-12-16 LAB — FIBRINOGEN: Fibrinogen: 219 mg/dL (ref 210–475)

## 2018-12-16 LAB — PLATELET COUNT: Platelets: 124 10*3/uL — ABNORMAL LOW (ref 150–400)

## 2018-12-16 LAB — APTT: aPTT: 39 seconds — ABNORMAL HIGH (ref 24–36)

## 2018-12-16 LAB — PREPARE RBC (CROSSMATCH)

## 2018-12-16 SURGERY — REPLACEMENT, AORTIC VALVE, OPEN
Anesthesia: General | Site: Chest

## 2018-12-16 MED ORDER — GLYCOPYRROLATE PF 0.2 MG/ML IJ SOSY
PREFILLED_SYRINGE | INTRAMUSCULAR | Status: DC | PRN
  Administered 2018-12-16: .1 mg via INTRAVENOUS

## 2018-12-16 MED ORDER — VANCOMYCIN HCL 1000 MG IV SOLR
INTRAVENOUS | Status: DC | PRN
  Administered 2018-12-16: 1000 mL

## 2018-12-16 MED ORDER — SODIUM CHLORIDE 0.9 % IV SOLN
1.5000 g | Freq: Two times a day (BID) | INTRAVENOUS | Status: AC
  Administered 2018-12-16 – 2018-12-18 (×4): 1.5 g via INTRAVENOUS
  Filled 2018-12-16 (×4): qty 1.5

## 2018-12-16 MED ORDER — SODIUM CHLORIDE 0.9 % IV SOLN: 250.0000 mL | INTRAVENOUS | Status: DC

## 2018-12-16 MED ORDER — LACTATED RINGERS IV SOLN
INTRAVENOUS | Status: DC | PRN
  Administered 2018-12-16: 07:00:00 via INTRAVENOUS

## 2018-12-16 MED ORDER — ALBUMIN HUMAN 5 % IV SOLN
250.0000 mL | INTRAVENOUS | Status: DC | PRN
  Administered 2018-12-16 (×2): 12.5 g via INTRAVENOUS

## 2018-12-16 MED ORDER — VASOPRESSIN 20 UNIT/ML IV SOLN
INTRAVENOUS | Status: DC | PRN
  Administered 2018-12-16 (×2): 1 [IU] via INTRAVENOUS

## 2018-12-16 MED ORDER — SODIUM CHLORIDE 0.9% IV SOLUTION
Freq: Once | INTRAVENOUS | Status: AC
  Administered 2018-12-16: 18:00:00 via INTRAVENOUS

## 2018-12-16 MED ORDER — BISACODYL 10 MG RE SUPP: 10.0000 mg | Freq: Every day | RECTAL | Status: DC

## 2018-12-16 MED ORDER — INSULIN REGULAR BOLUS VIA INFUSION
0.0000 [IU] | Freq: Three times a day (TID) | INTRAVENOUS | Status: DC
  Filled 2018-12-16: qty 10

## 2018-12-16 MED ORDER — DEXMEDETOMIDINE HCL IN NACL 200 MCG/50ML IV SOLN: 0.0000 ug/kg/h | INTRAVENOUS | Status: DC

## 2018-12-16 MED ORDER — METOPROLOL TARTRATE 12.5 MG HALF TABLET
ORAL_TABLET | ORAL | Status: AC
  Filled 2018-12-16: qty 1

## 2018-12-16 MED ORDER — ASPIRIN 81 MG PO CHEW: 324.0000 mg | CHEWABLE_TABLET | Freq: Every day | ORAL | Status: DC

## 2018-12-16 MED ORDER — 0.9 % SODIUM CHLORIDE (POUR BTL) OPTIME
TOPICAL | Status: DC | PRN
  Administered 2018-12-16: 09:00:00 5000 mL

## 2018-12-16 MED ORDER — SODIUM CHLORIDE 0.9% FLUSH
3.0000 mL | Freq: Two times a day (BID) | INTRAVENOUS | Status: DC
  Administered 2018-12-17 – 2018-12-20 (×6): 3 mL via INTRAVENOUS

## 2018-12-16 MED ORDER — SODIUM CHLORIDE 0.9 % IV SOLN
INTRAVENOUS | Status: DC
  Administered 2018-12-16: 15:00:00 via INTRAVENOUS

## 2018-12-16 MED ORDER — ONDANSETRON HCL 4 MG/2ML IJ SOLN: 4.0000 mg | Freq: Four times a day (QID) | INTRAMUSCULAR | Status: DC | PRN

## 2018-12-16 MED ORDER — CHLORHEXIDINE GLUCONATE 0.12% ORAL RINSE (MEDLINE KIT)
15.0000 mL | Freq: Two times a day (BID) | OROMUCOSAL | Status: DC
  Administered 2018-12-16: 15 mL via OROMUCOSAL

## 2018-12-16 MED ORDER — MIDAZOLAM HCL (PF) 10 MG/2ML IJ SOLN
INTRAMUSCULAR | Status: AC
  Filled 2018-12-16: qty 2

## 2018-12-16 MED ORDER — SODIUM CHLORIDE 0.9% FLUSH: 10.0000 mL | INTRAVENOUS | Status: DC | PRN

## 2018-12-16 MED ORDER — ALBUMIN HUMAN 5 % IV SOLN
INTRAVENOUS | Status: DC | PRN
  Administered 2018-12-16 (×4): via INTRAVENOUS

## 2018-12-16 MED ORDER — MIDAZOLAM HCL 5 MG/5ML IJ SOLN
INTRAMUSCULAR | Status: DC | PRN
  Administered 2018-12-16: 2 mg via INTRAVENOUS
  Administered 2018-12-16: 4 mg via INTRAVENOUS
  Administered 2018-12-16: 1 mg via INTRAVENOUS

## 2018-12-16 MED ORDER — PANTOPRAZOLE SODIUM 40 MG PO TBEC
40.0000 mg | DELAYED_RELEASE_TABLET | Freq: Every day | ORAL | Status: DC
  Administered 2018-12-18 – 2018-12-20 (×3): 40 mg via ORAL
  Filled 2018-12-16 (×4): qty 1

## 2018-12-16 MED ORDER — ACETAMINOPHEN 160 MG/5ML PO SOLN: 650.0000 mg | Freq: Once | ORAL | Status: AC

## 2018-12-16 MED ORDER — PLASMA-LYTE 148 IV SOLN
INTRAVENOUS | Status: DC | PRN
  Administered 2018-12-16: 11:00:00 500 mL

## 2018-12-16 MED ORDER — PHENYLEPHRINE HCL-NACL 20-0.9 MG/250ML-% IV SOLN: 0.0000 ug/min | INTRAVENOUS | Status: DC

## 2018-12-16 MED ORDER — OXYCODONE HCL 5 MG PO TABS: 5.0000 mg | ORAL_TABLET | ORAL | Status: DC | PRN

## 2018-12-16 MED ORDER — NITROGLYCERIN 0.2 MG/ML ON CALL CATH LAB
INTRAVENOUS | Status: DC | PRN
  Administered 2018-12-16 (×5): 20 ug via INTRAVENOUS

## 2018-12-16 MED ORDER — ROCURONIUM BROMIDE 10 MG/ML (PF) SYRINGE
PREFILLED_SYRINGE | INTRAVENOUS | Status: AC
  Filled 2018-12-16: qty 10

## 2018-12-16 MED ORDER — FAMOTIDINE IN NACL 20-0.9 MG/50ML-% IV SOLN
20.0000 mg | Freq: Two times a day (BID) | INTRAVENOUS | Status: AC
  Administered 2018-12-16 (×2): 20 mg via INTRAVENOUS
  Filled 2018-12-16: qty 50

## 2018-12-16 MED ORDER — ACETAMINOPHEN 650 MG RE SUPP
650.0000 mg | Freq: Once | RECTAL | Status: AC
  Administered 2018-12-16: 650 mg via RECTAL

## 2018-12-16 MED ORDER — FENTANYL CITRATE (PF) 250 MCG/5ML IJ SOLN
INTRAMUSCULAR | Status: DC | PRN
  Administered 2018-12-16: 250 ug via INTRAVENOUS
  Administered 2018-12-16: 100 ug via INTRAVENOUS
  Administered 2018-12-16: 250 ug via INTRAVENOUS
  Administered 2018-12-16: 100 ug via INTRAVENOUS
  Administered 2018-12-16: 250 ug via INTRAVENOUS
  Administered 2018-12-16: 100 ug via INTRAVENOUS
  Administered 2018-12-16: 200 ug via INTRAVENOUS

## 2018-12-16 MED ORDER — SODIUM CHLORIDE 0.9% FLUSH
10.0000 mL | Freq: Two times a day (BID) | INTRAVENOUS | Status: DC
  Administered 2018-12-16 – 2018-12-20 (×5): 10 mL

## 2018-12-16 MED ORDER — METOPROLOL TARTRATE 25 MG/10 ML ORAL SUSPENSION: 12.5000 mg | Freq: Two times a day (BID) | ORAL | Status: DC

## 2018-12-16 MED ORDER — CHLORHEXIDINE GLUCONATE 4 % EX LIQD: 30.0000 mL | CUTANEOUS | Status: DC

## 2018-12-16 MED ORDER — METOPROLOL TARTRATE 12.5 MG HALF TABLET
12.5000 mg | ORAL_TABLET | Freq: Two times a day (BID) | ORAL | Status: DC
  Administered 2018-12-17 – 2018-12-18 (×2): 12.5 mg via ORAL
  Filled 2018-12-16 (×3): qty 1

## 2018-12-16 MED ORDER — HEPARIN SODIUM (PORCINE) 1000 UNIT/ML IJ SOLN
INTRAMUSCULAR | Status: DC | PRN
  Administered 2018-12-16: 20000 [IU] via INTRAVENOUS
  Administered 2018-12-16: 5000 [IU] via INTRAVENOUS

## 2018-12-16 MED ORDER — LACTATED RINGERS IV SOLN
INTRAVENOUS | Status: DC
  Administered 2018-12-17: 03:00:00 via INTRAVENOUS

## 2018-12-16 MED ORDER — LACTATED RINGERS IV SOLN: INTRAVENOUS | Status: DC

## 2018-12-16 MED ORDER — VANCOMYCIN HCL IN DEXTROSE 1-5 GM/200ML-% IV SOLN
1000.0000 mg | Freq: Once | INTRAVENOUS | Status: AC
  Administered 2018-12-16: 1000 mg via INTRAVENOUS
  Filled 2018-12-16: qty 200

## 2018-12-16 MED ORDER — HYDROCORTISONE NA SUCCINATE PF 250 MG IJ SOLR
INTRAMUSCULAR | Status: AC
  Filled 2018-12-16: qty 250

## 2018-12-16 MED ORDER — SODIUM CHLORIDE 0.9% FLUSH: 3.0000 mL | INTRAVENOUS | Status: DC | PRN

## 2018-12-16 MED ORDER — CHLORHEXIDINE GLUCONATE CLOTH 2 % EX PADS
6.0000 | MEDICATED_PAD | Freq: Every day | CUTANEOUS | Status: DC
  Administered 2018-12-16 – 2018-12-20 (×5): 6 via TOPICAL

## 2018-12-16 MED ORDER — FENTANYL CITRATE (PF) 250 MCG/5ML IJ SOLN
INTRAMUSCULAR | Status: AC
  Filled 2018-12-16: qty 25

## 2018-12-16 MED ORDER — HEPARIN SODIUM (PORCINE) 1000 UNIT/ML IJ SOLN
INTRAMUSCULAR | Status: AC
  Filled 2018-12-16: qty 1

## 2018-12-16 MED ORDER — TRAMADOL HCL 50 MG PO TABS
50.0000 mg | ORAL_TABLET | ORAL | Status: DC | PRN
  Administered 2018-12-18: 10:00:00 50 mg via ORAL
  Filled 2018-12-16: qty 1

## 2018-12-16 MED ORDER — NITROGLYCERIN IN D5W 200-5 MCG/ML-% IV SOLN
0.0000 ug/min | INTRAVENOUS | Status: DC
  Administered 2018-12-17: 100 ug/min via INTRAVENOUS
  Filled 2018-12-16 (×2): qty 250

## 2018-12-16 MED ORDER — BISACODYL 5 MG PO TBEC
10.0000 mg | DELAYED_RELEASE_TABLET | Freq: Every day | ORAL | Status: DC
  Administered 2018-12-17 – 2018-12-19 (×3): 10 mg via ORAL
  Filled 2018-12-16 (×3): qty 2

## 2018-12-16 MED ORDER — SODIUM CHLORIDE 0.45 % IV SOLN
INTRAVENOUS | Status: DC | PRN
  Administered 2018-12-16: 15:00:00 via INTRAVENOUS

## 2018-12-16 MED ORDER — LIDOCAINE 2% (20 MG/ML) 5 ML SYRINGE
INTRAMUSCULAR | Status: AC
  Filled 2018-12-16: qty 5

## 2018-12-16 MED ORDER — MIDAZOLAM HCL 2 MG/2ML IJ SOLN: 2.0000 mg | INTRAMUSCULAR | Status: DC | PRN

## 2018-12-16 MED ORDER — HYDROCORTISONE NA SUCCINATE PF 100 MG IJ SOLR
INTRAMUSCULAR | Status: DC | PRN
  Administered 2018-12-16: 125 mg via INTRAVENOUS

## 2018-12-16 MED ORDER — PROPOFOL 10 MG/ML IV BOLUS
INTRAVENOUS | Status: AC
  Filled 2018-12-16: qty 20

## 2018-12-16 MED ORDER — CHLORHEXIDINE GLUCONATE 0.12 % MT SOLN
15.0000 mL | Freq: Once | OROMUCOSAL | Status: AC
  Administered 2018-12-16: 15 mL via OROMUCOSAL
  Filled 2018-12-16: qty 15

## 2018-12-16 MED ORDER — INSULIN REGULAR(HUMAN) IN NACL 100-0.9 UT/100ML-% IV SOLN: INTRAVENOUS | Status: DC

## 2018-12-16 MED ORDER — MORPHINE SULFATE (PF) 2 MG/ML IV SOLN: 1.0000 mg | INTRAVENOUS | Status: DC | PRN

## 2018-12-16 MED ORDER — ACETAMINOPHEN 500 MG PO TABS
1000.0000 mg | ORAL_TABLET | Freq: Four times a day (QID) | ORAL | Status: AC
  Administered 2018-12-17 – 2018-12-21 (×18): 1000 mg via ORAL
  Filled 2018-12-16 (×19): qty 2

## 2018-12-16 MED ORDER — HEMOSTATIC AGENTS (NO CHARGE) OPTIME
TOPICAL | Status: DC | PRN
  Administered 2018-12-16 (×9): 1 via TOPICAL

## 2018-12-16 MED ORDER — PROPOFOL 10 MG/ML IV BOLUS
INTRAVENOUS | Status: DC | PRN
  Administered 2018-12-16 (×3): 50 mg via INTRAVENOUS

## 2018-12-16 MED ORDER — ACETAMINOPHEN 160 MG/5ML PO SOLN: 1000.0000 mg | Freq: Four times a day (QID) | ORAL | Status: DC

## 2018-12-16 MED ORDER — METOPROLOL TARTRATE 12.5 MG HALF TABLET: 12.5000 mg | ORAL_TABLET | Freq: Once | ORAL | Status: DC

## 2018-12-16 MED ORDER — CHLORHEXIDINE GLUCONATE 0.12 % MT SOLN
15.0000 mL | OROMUCOSAL | Status: AC
  Administered 2018-12-16: 15 mL via OROMUCOSAL

## 2018-12-16 MED ORDER — ASPIRIN EC 325 MG PO TBEC
325.0000 mg | DELAYED_RELEASE_TABLET | Freq: Every day | ORAL | Status: DC
  Administered 2018-12-17: 325 mg via ORAL
  Filled 2018-12-16: qty 1

## 2018-12-16 MED ORDER — NOREPINEPHRINE 4 MG/250ML-% IV SOLN
0.0000 ug/min | INTRAVENOUS | Status: DC
  Filled 2018-12-16: qty 250

## 2018-12-16 MED ORDER — VASOPRESSIN 20 UNIT/ML IV SOLN
INTRAVENOUS | Status: AC
  Filled 2018-12-16: qty 1

## 2018-12-16 MED ORDER — PROTAMINE SULFATE 10 MG/ML IV SOLN
INTRAVENOUS | Status: DC | PRN
  Administered 2018-12-16: 200 mg via INTRAVENOUS
  Administered 2018-12-16: 30 mg via INTRAVENOUS

## 2018-12-16 MED ORDER — ROCURONIUM BROMIDE 100 MG/10ML IV SOLN
INTRAVENOUS | Status: DC | PRN
  Administered 2018-12-16 (×2): 50 mg via INTRAVENOUS
  Administered 2018-12-16: 30 mg via INTRAVENOUS
  Administered 2018-12-16: 100 mg via INTRAVENOUS

## 2018-12-16 MED ORDER — MAGNESIUM SULFATE 4 GM/100ML IV SOLN
4.0000 g | Freq: Once | INTRAVENOUS | Status: AC
  Administered 2018-12-16: 4 g via INTRAVENOUS
  Filled 2018-12-16: qty 100

## 2018-12-16 MED ORDER — ORAL CARE MOUTH RINSE
15.0000 mL | Freq: Two times a day (BID) | OROMUCOSAL | Status: DC
  Administered 2018-12-16: 15 mL via OROMUCOSAL

## 2018-12-16 MED ORDER — POTASSIUM CHLORIDE 10 MEQ/50ML IV SOLN: 10.0000 meq | INTRAVENOUS | Status: AC

## 2018-12-16 MED ORDER — EPHEDRINE SULFATE-NACL 50-0.9 MG/10ML-% IV SOSY
PREFILLED_SYRINGE | INTRAVENOUS | Status: DC | PRN
  Administered 2018-12-16 (×2): 10 mg via INTRAVENOUS
  Administered 2018-12-16: 5 mg via INTRAVENOUS
  Administered 2018-12-16: 10 mg via INTRAVENOUS
  Administered 2018-12-16: 5 mg via INTRAVENOUS

## 2018-12-16 MED ORDER — LACTATED RINGERS IV SOLN: 500.0000 mL | Freq: Once | INTRAVENOUS | Status: DC | PRN

## 2018-12-16 MED ORDER — METOPROLOL TARTRATE 5 MG/5ML IV SOLN
2.5000 mg | INTRAVENOUS | Status: DC | PRN
  Administered 2018-12-16 – 2018-12-17 (×6): 5 mg via INTRAVENOUS
  Filled 2018-12-16 (×6): qty 5

## 2018-12-16 MED ORDER — DOCUSATE SODIUM 100 MG PO CAPS
200.0000 mg | ORAL_CAPSULE | Freq: Every day | ORAL | Status: DC
  Administered 2018-12-17 – 2018-12-19 (×3): 200 mg via ORAL
  Filled 2018-12-16 (×3): qty 2

## 2018-12-16 SURGICAL SUPPLY — 120 items
ADAPTER CARDIO PERF ANTE/RETRO (ADAPTER) ×3 IMPLANT
APPLICATOR TIP COSEAL (VASCULAR PRODUCTS) ×1 IMPLANT
ATRICLIP EXCLUSION 50 STD HAND (Clip) ×1 IMPLANT
BAG DECANTER FOR FLEXI CONT (MISCELLANEOUS) ×4 IMPLANT
BLADE 15 SAFETY STRL DISP (BLADE) ×1 IMPLANT
BLADE CLIPPER SURG (BLADE) ×3 IMPLANT
BLADE STERNUM SYSTEM 6 (BLADE) ×3 IMPLANT
BLADE SURG 15 STRL LF DISP TIS (BLADE) ×2 IMPLANT
BLADE SURG 15 STRL SS (BLADE) ×1
CANISTER SUCT 3000ML PPV (MISCELLANEOUS) ×3 IMPLANT
CANNULA AORTIC ROOT 9FR (CANNULA) ×1 IMPLANT
CANNULA MC2 2 STG 36/46 NON-V (CANNULA) IMPLANT
CANNULA SUMP PERICARDIAL (CANNULA) ×1 IMPLANT
CANNULA VENOUS 2 STG 34/46 (CANNULA) ×1
CATH CPB KIT HENDRICKSON (MISCELLANEOUS) ×3 IMPLANT
CATH HEART VENT LEFT (CATHETERS) ×2 IMPLANT
CATH RETROPLEGIA CORONARY 14FR (CATHETERS) ×3 IMPLANT
CATH ROBINSON RED A/P 18FR (CATHETERS) ×10 IMPLANT
CAUTERY HIGH TEMP VAS (MISCELLANEOUS) ×2 IMPLANT
CLAMP ISOLATOR SYNERGY LG (MISCELLANEOUS) ×1 IMPLANT
CLIP VESOCCLUDE LG 6/CT (CLIP) ×1 IMPLANT
CONN 3/8X3/8 GISH STERILE (MISCELLANEOUS) ×1 IMPLANT
CONN ST 1/4X3/8  BEN (MISCELLANEOUS) ×1
CONN ST 1/4X3/8 BEN (MISCELLANEOUS) IMPLANT
CONT SPEC 4OZ CLIKSEAL STRL BL (MISCELLANEOUS) ×6 IMPLANT
COVER MAYO STAND STRL (DRAPES) ×1 IMPLANT
COVER WAND RF STERILE (DRAPES) ×2 IMPLANT
DERMABOND ADVANCED (GAUZE/BANDAGES/DRESSINGS) ×2
DERMABOND ADVANCED .7 DNX12 (GAUZE/BANDAGES/DRESSINGS) IMPLANT
DEVICE SUT CK QUICK LOAD MINI (Prosthesis & Implant Heart) ×1 IMPLANT
DRAIN CHANNEL 28F RND 3/8 FF (WOUND CARE) ×2 IMPLANT
DRAPE CARDIOVASCULAR INCISE (DRAPES) ×1
DRAPE SLUSH/WARMER DISC (DRAPES) ×1 IMPLANT
DRAPE SRG 135X102X78XABS (DRAPES) IMPLANT
DRSG AQUACEL AG ADV 3.5X 6 (GAUZE/BANDAGES/DRESSINGS) ×1 IMPLANT
DRSG AQUACEL AG ADV 3.5X14 (GAUZE/BANDAGES/DRESSINGS) ×1 IMPLANT
DRSG COVADERM 4X14 (GAUZE/BANDAGES/DRESSINGS) ×2 IMPLANT
ELECT BLADE 4.0 EZ CLEAN MEGAD (MISCELLANEOUS) ×3
ELECT CAUTERY BLADE 6.4 (BLADE) ×3 IMPLANT
ELECT REM PT RETURN 9FT ADLT (ELECTROSURGICAL) ×6
ELECTRODE BLDE 4.0 EZ CLN MEGD (MISCELLANEOUS) IMPLANT
ELECTRODE REM PT RTRN 9FT ADLT (ELECTROSURGICAL) ×4 IMPLANT
FELT TEFLON 1X6 (MISCELLANEOUS) ×4 IMPLANT
GAUZE SPONGE 4X4 12PLY STRL (GAUZE/BANDAGES/DRESSINGS) ×3 IMPLANT
GAUZE SPONGE 4X4 12PLY STRL LF (GAUZE/BANDAGES/DRESSINGS) ×1 IMPLANT
GLOVE BIO SURGEON STRL SZ 6 (GLOVE) ×4 IMPLANT
GLOVE BIO SURGEON STRL SZ 6.5 (GLOVE) ×1 IMPLANT
GLOVE BIO SURGEON STRL SZ7 (GLOVE) ×1 IMPLANT
GLOVE BIO SURGEON STRL SZ7.5 (GLOVE) ×1 IMPLANT
GLOVE BIOGEL PI IND STRL 6 (GLOVE) IMPLANT
GLOVE BIOGEL PI IND STRL 7.0 (GLOVE) IMPLANT
GLOVE BIOGEL PI IND STRL 7.5 (GLOVE) IMPLANT
GLOVE BIOGEL PI INDICATOR 6 (GLOVE) ×1
GLOVE BIOGEL PI INDICATOR 7.0 (GLOVE) ×1
GLOVE BIOGEL PI INDICATOR 7.5 (GLOVE) ×1
GLOVE NEODERM STRL 7.5 LF PF (GLOVE) ×4 IMPLANT
GLOVE SURG NEODERM 7.5  LF PF (GLOVE) ×4
GOWN STRL REUS W/ TWL LRG LVL3 (GOWN DISPOSABLE) ×8 IMPLANT
GOWN STRL REUS W/TWL LRG LVL3 (GOWN DISPOSABLE) ×6
GRAFT HEMASHIELD 10MM (Graft) ×1 IMPLANT
GRAFT VASC STRG 30X10STRL (Graft) IMPLANT
GRAFT WOVEN D/V 28DX30L (Vascular Products) ×1 IMPLANT
HEMOSTAT POWDER SURGIFOAM 1G (HEMOSTASIS) ×9 IMPLANT
HEMOSTAT SURGICEL 2X14 (HEMOSTASIS) ×3 IMPLANT
INSERT FOGARTY SM (MISCELLANEOUS) ×1 IMPLANT
INSERT FOGARTY XLG (MISCELLANEOUS) ×1 IMPLANT
KIT BASIN OR (CUSTOM PROCEDURE TRAY) ×3 IMPLANT
KIT SUCTION CATH 14FR (SUCTIONS) ×3 IMPLANT
KIT SUT CK MINI COMBO 4X17 (Prosthesis & Implant Heart) ×1 IMPLANT
KIT TURNOVER KIT B (KITS) ×3 IMPLANT
LINE VENT (MISCELLANEOUS) ×1 IMPLANT
LOOP VESSEL SUPERMAXI WHITE (MISCELLANEOUS) ×1 IMPLANT
NS IRRIG 1000ML POUR BTL (IV SOLUTION) ×15 IMPLANT
PACK E OPEN HEART (SUTURE) ×3 IMPLANT
PACK OPEN HEART (CUSTOM PROCEDURE TRAY) ×3 IMPLANT
PAD ARMBOARD 7.5X6 YLW CONV (MISCELLANEOUS) ×6 IMPLANT
POSITIONER HEAD DONUT 9IN (MISCELLANEOUS) ×3 IMPLANT
SEALANT SURG COSEAL 8ML (VASCULAR PRODUCTS) ×1 IMPLANT
SET CARDIOPLEGIA MPS 5001102 (MISCELLANEOUS) ×1 IMPLANT
SPONGE LAP 4X18 RFD (DISPOSABLE) ×1 IMPLANT
SUT BONE WAX W31G (SUTURE) ×3 IMPLANT
SUT ETHIBON 2 0 V 52N 30 (SUTURE) ×6 IMPLANT
SUT ETHIBON EXCEL 2-0 V-5 (SUTURE) IMPLANT
SUT ETHIBOND 2 0 SH (SUTURE) ×1
SUT ETHIBOND 2 0 SH 36X2 (SUTURE) IMPLANT
SUT ETHIBOND V-5 VALVE (SUTURE) IMPLANT
SUT ETHIBOND X763 2 0 SH 1 (SUTURE) ×2 IMPLANT
SUT MNCRL AB 3-0 PS2 18 (SUTURE) ×2 IMPLANT
SUT MNCRL AB 4-0 PS2 18 (SUTURE) ×1 IMPLANT
SUT PDS AB 1 CTX 36 (SUTURE) ×2 IMPLANT
SUT PROLENE 3 0 RB 1 (SUTURE) ×1 IMPLANT
SUT PROLENE 3 0 SH 1 (SUTURE) ×3 IMPLANT
SUT PROLENE 3 0 SH 48 (SUTURE) ×8 IMPLANT
SUT PROLENE 3 0 SH DA (SUTURE) ×2 IMPLANT
SUT PROLENE 3 0 SH1 36 (SUTURE) ×2 IMPLANT
SUT PROLENE 4 0 RB 1 (SUTURE) ×8
SUT PROLENE 4 0 SH DA (SUTURE) ×4 IMPLANT
SUT PROLENE 4-0 RB1 .5 CRCL 36 (SUTURE) ×8 IMPLANT
SUT PROLENE 5 0 C 1 36 (SUTURE) ×3 IMPLANT
SUT STEEL 6MS V (SUTURE) ×1 IMPLANT
SUT STEEL STERNAL CCS#1 18IN (SUTURE) IMPLANT
SUT STEEL SZ 6 DBL 3X14 BALL (SUTURE) ×1 IMPLANT
SUT VIC AB 1 CTX 36 (SUTURE) ×2
SUT VIC AB 1 CTX36XBRD ANBCTR (SUTURE) ×4 IMPLANT
SUT VIC AB 2-0 CT1 27 (SUTURE) ×1
SUT VIC AB 2-0 CT1 TAPERPNT 27 (SUTURE) IMPLANT
SUT VIC AB 3-0 X1 27 (SUTURE) IMPLANT
SYSTEM SAHARA CHEST DRAIN ATS (WOUND CARE) ×3 IMPLANT
TAPE CLOTH SURG 4X10 WHT LF (GAUZE/BANDAGES/DRESSINGS) ×1 IMPLANT
TAPE PAPER 2X10 WHT MICROPORE (GAUZE/BANDAGES/DRESSINGS) ×1 IMPLANT
TOWEL GREEN STERILE (TOWEL DISPOSABLE) ×3 IMPLANT
TOWEL GREEN STERILE FF (TOWEL DISPOSABLE) ×3 IMPLANT
TRAY FOLEY SLVR 14FR TEMP STAT (SET/KITS/TRAYS/PACK) ×2 IMPLANT
TUBE SUCT ARGYLE STRL (TUBING) ×1 IMPLANT
TUBE SUCT INTRACARD DLP 20F (MISCELLANEOUS) ×1 IMPLANT
UNDERPAD 30X30 (UNDERPADS AND DIAPERS) ×3 IMPLANT
VALVE ON-X AORTIC 23MM (Prosthesis & Implant Heart) ×1 IMPLANT
VENT LEFT HEART 12002 (CATHETERS) ×3
WATER STERILE IRR 1000ML POUR (IV SOLUTION) ×6 IMPLANT
YANKAUER SUCT BULB TIP NO VENT (SUCTIONS) ×1 IMPLANT

## 2018-12-16 NOTE — Progress Notes (Signed)
RT called to room d/t pt apnea while in first stage of wean. Pt switched back to original settings SIMV/PRVC/PSV VT 620, Rate 12, FIO2 50%, PEEP 5. RT will reassess at a later time when pt is more awake. RT will continue to monitor.

## 2018-12-16 NOTE — H&P (Signed)
History and Physical Interval Note:  12/16/2018 7:22 AM  Joe Klein  has presented today for surgery, with the diagnosis of AORTIC STENOSIS TAA AFIB.  The various methods of treatment have been discussed with the patient and family. After consideration of risks, benefits and other options for treatment, the patient has consented to  Procedure(s): AORTIC VALVE REPLACEMENT (AVR) (N/A) THORACIC ASCENDING ANEURYSM REPAIR (N/A) MAZE (N/A) TRANSESOPHAGEAL ECHOCARDIOGRAM (TEE) (N/A) as a surgical intervention.  The patient's history has been reviewed, patient examined, no change in status, stable for surgery.  I have reviewed the patient's chart and labs.  Questions were answered to the patient's satisfaction.     [name]

## 2018-12-16 NOTE — Anesthesia Procedure Notes (Signed)
Central Venous Catheter Insertion Performed by: Catalina Gravel, MD, anesthesiologist Start/End8/14/2020 6:45 AM, 12/16/2018 6:55 AM Patient location: Pre-op. Preanesthetic checklist: patient identified, IV checked, site marked, risks and benefits discussed, surgical consent, monitors and equipment checked, pre-op evaluation, timeout performed and anesthesia consent Position: Trendelenburg Lidocaine 1% used for infiltration and patient sedated Hand hygiene performed , maximum sterile barriers used  and Seldinger technique used Catheter size: 8.5 Fr Central line was placed.Sheath introducer Procedure performed using ultrasound guided technique. Ultrasound Notes:anatomy identified, needle tip was noted to be adjacent to the nerve/plexus identified, no ultrasound evidence of intravascular and/or intraneural injection and image(s) printed for medical record Attempts: 1 Following insertion, line sutured, dressing applied and Biopatch. Post procedure assessment: free fluid flow, blood return through all ports and no air  Patient tolerated the procedure well with no immediate complications.

## 2018-12-16 NOTE — Progress Notes (Signed)
RT assessed pt for readiness to wean to next phase of heart wean. Pt having frequent episodes of apnea at this time. RT placed pt back on original settings of SIMV/PRVC/PSV  VT 620  Rate 12 PEEP 5 and FIO2 50%. Wean terminated at this time. RT will reassess pt for wean in 30 minutes. RT will continue to monitor.

## 2018-12-16 NOTE — Anesthesia Procedure Notes (Signed)
Arterial Line Insertion Start/End8/14/2020 6:45 AM, 12/16/2018 6:50 AM Performed by: Leim Fabry T, CRNA  Preanesthetic checklist: patient identified, IV checked, site marked, risks and benefits discussed, surgical consent, monitors and equipment checked, pre-op evaluation, timeout performed and anesthesia consent Lidocaine 1% used for infiltration and patient sedated Left, radial was placed Catheter size: 20 G Hand hygiene performed  and maximum sterile barriers used  Allen's test indicative of satisfactory collateral circulation Attempts: 1 Procedure performed without using ultrasound guided technique. Following insertion, Biopatch and dressing applied. Post procedure assessment: normal  Patient tolerated the procedure well with no immediate complications.

## 2018-12-16 NOTE — Progress Notes (Signed)
  Echocardiogram Echocardiogram Transesophageal has been performed.  Joe Klein M 12/16/2018, 8:24 AM 

## 2018-12-16 NOTE — Anesthesia Postprocedure Evaluation (Signed)
Anesthesia Post Note  Patient: Joe Klein  Procedure(s) Performed: AORTIC VALVE REPLACEMENT (AVR) (N/A ) THORACIC ASCENDING ANEURYSM REPAIR (N/A ) MAZE (N/A ) TRANSESOPHAGEAL ECHOCARDIOGRAM (TEE) (N/A ) CLIPPING OF ATRIAL APPENDAGE (Left Chest)     Patient location during evaluation: ICU Anesthesia Type: General Level of consciousness: sedated and patient cooperative Pain management: pain level controlled Vital Signs Assessment: post-procedure vital signs reviewed and stable Respiratory status: spontaneous breathing Cardiovascular status: stable Anesthetic complications: no    Last Vitals:  Vitals:   12/16/18 1500 12/16/18 1550  BP: 106/68 126/67  Pulse: 92 90  Resp: 12 12  Temp: (!) 36.4 C   SpO2: 100% 100%    Last Pain:  Vitals:   12/16/18 1500  TempSrc: Core (Comment)  PainSc:                  Nolon Nations

## 2018-12-16 NOTE — Anesthesia Procedure Notes (Signed)
Procedure Name: Intubation Date/Time: 12/16/2018 7:57 AM Performed by: Elayne Snare, CRNA Pre-anesthesia Checklist: Patient identified, Emergency Drugs available, Suction available and Patient being monitored Patient Re-evaluated:Patient Re-evaluated prior to induction Oxygen Delivery Method: Circle System Utilized Preoxygenation: Pre-oxygenation with 100% oxygen Induction Type: IV induction Ventilation: Mask ventilation without difficulty Laryngoscope Size: Mac and 4 Grade View: Grade I Tube type: Oral Tube size: 8.0 mm Number of attempts: 1 Airway Equipment and Method: Stylet Placement Confirmation: ETT inserted through vocal cords under direct vision,  positive ETCO2 and breath sounds checked- equal and bilateral Secured at: 23 cm Tube secured with: Tape Dental Injury: Teeth and Oropharynx as per pre-operative assessment

## 2018-12-16 NOTE — Brief Op Note (Signed)
12/16/2018  12:06 PM  PATIENT:  Joe Klein  58 y.o. male  PRE-OPERATIVE DIAGNOSIS:  AORTIC STENOSIS THORACIC ASCENDING ANEURYSM ATRIAL FIBRILLATION  POST-OPERATIVE DIAGNOSIS:  AORTIC STENOSIS THORACIC ASCENDING ANEURYSM ATRIAL FIBRILLATION  PROCEDURE:  Procedure(s): AORTIC VALVE REPLACEMENT (AVR) (N/A) THORACIC ASCENDING ANEURYSM REPAIR (N/A) MAZE (N/A) TRANSESOPHAGEAL ECHOCARDIOGRAM (TEE) (N/A)  SURGEON:  Surgeon(s) and Role:    * [name], MD - Primary  PHYSICIAN ASSISTANT: Freddye Cardamone PA-C  ASSISTANTS: KIM HARDY RNFA   ANESTHESIA:   general  EBL:  0 mL   BLOOD ADMINISTERED:none  DRAINS: LEFT PLEURAL AND PERICARDIAL CHEST TUBES   LOCAL MEDICATIONS USED:  NONE  SPECIMEN:  Source of Specimen:  AORTIC  ANEURYSM AND AORTIC VALVE LEAFLETS  DISPOSITION OF SPECIMEN:  PATHOLOGY  COUNTS:  YES  TOURNIQUET:  * No tourniquets in log *  DICTATION: .Dragon Dictation  PLAN OF CARE: Admit to inpatient   PATIENT DISPOSITION:  ICU - intubated and hemodynamically stable.   Delay start of Pharmacological VTE agent (>24hrs) due to surgical blood loss or risk of bleeding: yes  COMPLICATIONS: NO KNOWN

## 2018-12-16 NOTE — Progress Notes (Signed)
RT assessed pt for readiness to wean. Pt able to hold head off of pillow for 20+ sec, stick out tongue, squeeze hand, and wiggle toes upon request. Pt placed on SIMV/PRVC/PSV VT 620, rate 4, FIO2 40% and PEEP 5. RT will reassess pt in 30 minutes for determination of further wean progression. RT will continue to monitor.

## 2018-12-16 NOTE — Transfer of Care (Signed)
Immediate Anesthesia Transfer of Care Note  Patient: Joe Klein  Procedure(s) Performed: AORTIC VALVE REPLACEMENT (AVR) (N/A ) THORACIC ASCENDING ANEURYSM REPAIR (N/A ) MAZE (N/A ) TRANSESOPHAGEAL ECHOCARDIOGRAM (TEE) (N/A ) CLIPPING OF ATRIAL APPENDAGE (Left Chest)  Patient Location: ICU  Anesthesia Type:General  Level of Consciousness: Patient remains intubated per anesthesia plan  Airway & Oxygen Therapy: Patient remains intubated per anesthesia plan and Patient placed on Ventilator (see vital sign flow sheet for setting)  Post-op Assessment: Report given to RN and Post -op Vital signs reviewed and stable  Post vital signs: Reviewed and stable  Last Vitals:  Vitals Value Taken Time  BP    Temp 36.2 C 12/16/18 1410  Pulse 92 12/16/18 1408  Resp 14 12/16/18 1410  SpO2 100 % 12/16/18 1408  Vitals shown include unvalidated device data.  Last Pain:  Vitals:   12/16/18 0602  PainSc: 0-No pain      Patients Stated Pain Goal: 3 (48/18/56 3149)  Complications: No apparent anesthesia complications

## 2018-12-16 NOTE — Anesthesia Procedure Notes (Signed)
Central Venous Catheter Insertion Performed by: Catalina Gravel, MD, anesthesiologist Start/End8/14/2020 6:55 AM, 12/16/2018 7:00 AM Patient location: Pre-op. Preanesthetic checklist: patient identified, IV checked, site marked, risks and benefits discussed, surgical consent, monitors and equipment checked, pre-op evaluation and timeout performed Position: Trendelenburg Hand hygiene performed  and maximum sterile barriers used  Total catheter length 100. PA cath was placed.Swan type:thermodilution PA Cath depth:55 Procedure performed without using ultrasound guided technique. Attempts: 1 Patient tolerated the procedure well with no immediate complications.

## 2018-12-17 ENCOUNTER — Other Ambulatory Visit: Payer: Self-pay

## 2018-12-17 ENCOUNTER — Inpatient Hospital Stay (HOSPITAL_COMMUNITY): Payer: Medicaid Other

## 2018-12-17 LAB — BASIC METABOLIC PANEL
Anion gap: 7 (ref 5–15)
Anion gap: 10 (ref 5–15)
BUN: 34 mg/dL — ABNORMAL HIGH (ref 6–20)
BUN: 31 mg/dL — ABNORMAL HIGH (ref 6–20)
CO2: 21 mmol/L — ABNORMAL LOW (ref 22–32)
CO2: 22 mmol/L (ref 22–32)
Calcium: 8 mg/dL — ABNORMAL LOW (ref 8.9–10.3)
Calcium: 8.1 mg/dL — ABNORMAL LOW (ref 8.9–10.3)
Chloride: 111 mmol/L (ref 98–111)
Chloride: 106 mmol/L (ref 98–111)
Creatinine, Ser: 2.03 mg/dL — ABNORMAL HIGH (ref 0.61–1.24)
Creatinine, Ser: 1.96 mg/dL — ABNORMAL HIGH (ref 0.61–1.24)
GFR calc Af Amer: 40 mL/min — ABNORMAL LOW (ref >60)
GFR calc Af Amer: 42 mL/min — ABNORMAL LOW (ref >60)
GFR calc non Af Amer: 35 mL/min — ABNORMAL LOW (ref >60)
GFR calc non Af Amer: 36 mL/min — ABNORMAL LOW (ref >60)
Glucose, Bld: 133 mg/dL — ABNORMAL HIGH (ref 70–99)
Glucose, Bld: 158 mg/dL — ABNORMAL HIGH (ref 70–99)
Potassium: 4.6 mmol/L (ref 3.5–5.1)
Potassium: 4.1 mmol/L (ref 3.5–5.1)
Sodium: 139 mmol/L (ref 135–145)
Sodium: 138 mmol/L (ref 135–145)

## 2018-12-17 LAB — CBC
HCT: 26.9 % — ABNORMAL LOW (ref 39.0–52.0)
HCT: 27.2 % — ABNORMAL LOW (ref 39.0–52.0)
Hemoglobin: 9.1 g/dL — ABNORMAL LOW (ref 13.0–17.0)
Hemoglobin: 9.1 g/dL — ABNORMAL LOW (ref 13.0–17.0)
MCH: 29 pg (ref 26.0–34.0)
MCH: 28.6 pg (ref 26.0–34.0)
MCHC: 33.8 g/dL (ref 30.0–36.0)
MCHC: 33.5 g/dL (ref 30.0–36.0)
MCV: 85.7 fL (ref 80.0–100.0)
MCV: 85.5 fL (ref 80.0–100.0)
Platelets: 120 10*3/uL — ABNORMAL LOW (ref 150–400)
Platelets: 106 10*3/uL — ABNORMAL LOW (ref 150–400)
RBC: 3.14 MIL/uL — ABNORMAL LOW (ref 4.22–5.81)
RBC: 3.18 MIL/uL — ABNORMAL LOW (ref 4.22–5.81)
RDW: 14.3 % (ref 11.5–15.5)
RDW: 14.6 % (ref 11.5–15.5)
WBC: 11.3 10*3/uL — ABNORMAL HIGH (ref 4.0–10.5)
WBC: 12.7 10*3/uL — ABNORMAL HIGH (ref 4.0–10.5)
nRBC: 0 % (ref 0.0–0.2)
nRBC: 0 % (ref 0.0–0.2)

## 2018-12-17 LAB — GLUCOSE, CAPILLARY
Glucose-Capillary: 111 mg/dL — ABNORMAL HIGH (ref 70–99)
Glucose-Capillary: 115 mg/dL — ABNORMAL HIGH (ref 70–99)
Glucose-Capillary: 117 mg/dL — ABNORMAL HIGH (ref 70–99)
Glucose-Capillary: 121 mg/dL — ABNORMAL HIGH (ref 70–99)
Glucose-Capillary: 122 mg/dL — ABNORMAL HIGH (ref 70–99)
Glucose-Capillary: 122 mg/dL — ABNORMAL HIGH (ref 70–99)
Glucose-Capillary: 122 mg/dL — ABNORMAL HIGH (ref 70–99)
Glucose-Capillary: 124 mg/dL — ABNORMAL HIGH (ref 70–99)
Glucose-Capillary: 125 mg/dL — ABNORMAL HIGH (ref 70–99)
Glucose-Capillary: 127 mg/dL — ABNORMAL HIGH (ref 70–99)
Glucose-Capillary: 130 mg/dL — ABNORMAL HIGH (ref 70–99)
Glucose-Capillary: 131 mg/dL — ABNORMAL HIGH (ref 70–99)
Glucose-Capillary: 134 mg/dL — ABNORMAL HIGH (ref 70–99)
Glucose-Capillary: 136 mg/dL — ABNORMAL HIGH (ref 70–99)
Glucose-Capillary: 145 mg/dL — ABNORMAL HIGH (ref 70–99)
Glucose-Capillary: 156 mg/dL — ABNORMAL HIGH (ref 70–99)
Glucose-Capillary: 88 mg/dL (ref 70–99)
Glucose-Capillary: 96 mg/dL (ref 70–99)

## 2018-12-17 LAB — BPAM FFP
Blood Product Expiration Date: 202008162359
Blood Product Expiration Date: 202008162359
Blood Product Expiration Date: 202008162359
Blood Product Expiration Date: 202008162359
ISSUE DATE / TIME: 202008110315
ISSUE DATE / TIME: 202008141248
ISSUE DATE / TIME: 202008141248
ISSUE DATE / TIME: 202008141725
Unit Type and Rh: 6200
Unit Type and Rh: 6200
Unit Type and Rh: 6200
Unit Type and Rh: 6200

## 2018-12-17 LAB — POCT I-STAT 7, (LYTES, BLD GAS, ICA,H+H)
Acid-base deficit: 3 mmol/L — ABNORMAL HIGH (ref 0.0–2.0)
Acid-base deficit: 4 mmol/L — ABNORMAL HIGH (ref 0.0–2.0)
Bicarbonate: 21.1 mmol/L (ref 20.0–28.0)
Bicarbonate: 20 mmol/L (ref 20.0–28.0)
Calcium, Ion: 1.12 mmol/L — ABNORMAL LOW (ref 1.15–1.40)
Calcium, Ion: 1.15 mmol/L (ref 1.15–1.40)
HCT: 26 % — ABNORMAL LOW (ref 39.0–52.0)
HCT: 26 % — ABNORMAL LOW (ref 39.0–52.0)
Hemoglobin: 8.8 g/dL — ABNORMAL LOW (ref 13.0–17.0)
Hemoglobin: 8.8 g/dL — ABNORMAL LOW (ref 13.0–17.0)
O2 Saturation: 99 %
O2 Saturation: 98 %
Patient temperature: 36.9
Patient temperature: 37.2
Potassium: 4.7 mmol/L (ref 3.5–5.1)
Potassium: 4.4 mmol/L (ref 3.5–5.1)
Sodium: 141 mmol/L (ref 135–145)
Sodium: 142 mmol/L (ref 135–145)
TCO2: 22 mmol/L (ref 22–32)
TCO2: 21 mmol/L — ABNORMAL LOW (ref 22–32)
pCO2 arterial: 34.5 mmHg (ref 32.0–48.0)
pCO2 arterial: 32.8 mmHg (ref 32.0–48.0)
pH, Arterial: 7.394 (ref 7.350–7.450)
pH, Arterial: 7.395 (ref 7.350–7.450)
pO2, Arterial: 155 mmHg — ABNORMAL HIGH (ref 83.0–108.0)
pO2, Arterial: 107 mmHg (ref 83.0–108.0)

## 2018-12-17 LAB — BPAM PLATELET PHERESIS
Blood Product Expiration Date: 202008162359
ISSUE DATE / TIME: 202008141725
Unit Type and Rh: 5100

## 2018-12-17 LAB — PREPARE FRESH FROZEN PLASMA
Unit division: 0
Unit division: 0
Unit division: 0

## 2018-12-17 LAB — HEMOGLOBIN AND HEMATOCRIT, BLOOD
HCT: 21.2 % — ABNORMAL LOW (ref 39.0–52.0)
Hemoglobin: 7 g/dL — ABNORMAL LOW (ref 13.0–17.0)

## 2018-12-17 LAB — PREPARE PLATELET PHERESIS: Unit division: 0

## 2018-12-17 LAB — MAGNESIUM
Magnesium: 2.9 mg/dL — ABNORMAL HIGH (ref 1.7–2.4)
Magnesium: 2.6 mg/dL — ABNORMAL HIGH (ref 1.7–2.4)

## 2018-12-17 LAB — PREPARE RBC (CROSSMATCH)

## 2018-12-17 MED ORDER — SODIUM CHLORIDE 0.9% IV SOLUTION
Freq: Once | INTRAVENOUS | Status: AC
  Administered 2018-12-17: 02:00:00 50 mL via INTRAVENOUS

## 2018-12-17 MED ORDER — SODIUM CHLORIDE 0.9 % IV SOLN
25.0000 mg | Freq: Four times a day (QID) | INTRAVENOUS | Status: DC | PRN
  Filled 2018-12-17 (×2): qty 1

## 2018-12-17 MED ORDER — CHLORPROMAZINE HCL 25 MG PO TABS
25.0000 mg | ORAL_TABLET | Freq: Three times a day (TID) | ORAL | Status: DC | PRN
  Administered 2018-12-17: 25 mg via ORAL
  Filled 2018-12-17 (×2): qty 1

## 2018-12-17 MED ORDER — AMIODARONE LOAD VIA INFUSION
150.0000 mg | Freq: Once | INTRAVENOUS | Status: AC
  Administered 2018-12-17: 150 mg via INTRAVENOUS
  Filled 2018-12-17: qty 83.34

## 2018-12-17 MED ORDER — AMIODARONE HCL IN DEXTROSE 360-4.14 MG/200ML-% IV SOLN
30.0000 mg/h | INTRAVENOUS | Status: DC
  Administered 2018-12-18 (×2): 30 mg/h via INTRAVENOUS
  Filled 2018-12-17 (×2): qty 200

## 2018-12-17 MED ORDER — LIDOCAINE HCL (PF) 1 % IJ SOLN
INTRAMUSCULAR | Status: AC
  Filled 2018-12-17: qty 5

## 2018-12-17 MED ORDER — AMIODARONE HCL IN DEXTROSE 360-4.14 MG/200ML-% IV SOLN
60.0000 mg/h | INTRAVENOUS | Status: AC
  Administered 2018-12-17: 60 mg/h via INTRAVENOUS
  Filled 2018-12-17: qty 400

## 2018-12-17 MED ORDER — CHLORHEXIDINE GLUCONATE 0.12% ORAL RINSE (MEDLINE KIT): 15.0000 mL | Freq: Two times a day (BID) | OROMUCOSAL | Status: DC

## 2018-12-17 MED ORDER — ORAL CARE MOUTH RINSE
15.0000 mL | Freq: Two times a day (BID) | OROMUCOSAL | Status: DC
  Administered 2018-12-17 – 2018-12-25 (×12): 15 mL via OROMUCOSAL

## 2018-12-17 MED ORDER — ORAL CARE MOUTH RINSE
15.0000 mL | OROMUCOSAL | Status: DC
  Administered 2018-12-17 (×2): 15 mL via OROMUCOSAL

## 2018-12-17 MED ORDER — SIMETHICONE 80 MG PO CHEW
80.0000 mg | CHEWABLE_TABLET | Freq: Four times a day (QID) | ORAL | Status: DC | PRN
  Administered 2018-12-17: 80 mg via ORAL
  Filled 2018-12-17: qty 1

## 2018-12-17 MED ORDER — INSULIN ASPART 100 UNIT/ML ~~LOC~~ SOLN
2.0000 [IU] | SUBCUTANEOUS | Status: DC
  Administered 2018-12-17 (×3): 2 [IU] via SUBCUTANEOUS

## 2018-12-17 NOTE — Progress Notes (Signed)
RT assessed pt for readiness to wean to extubate. RN will draw ABG in 30 minutes, pending results is good RT will have pt do VC and NIF to determine eligibility to extubate. Pt was able to follow commands and perform task without difficulty. RT placed pt on next phase with vent settings CPAP/PSV 10/5 and 40% FIO2. RT will continue to monitor.

## 2018-12-17 NOTE — Progress Notes (Signed)
RN paged MD to discuss hiccups and PACs. New orders given.

## 2018-12-17 NOTE — Progress Notes (Signed)
RT assessed pt for readiness to wean. Pt able to follow commands and complete task as directed. RT will proceed with next phase of heart wean. Pt placed on SIMV/PRVC/PSV  VT 620, Rate 4, PEEP 5, FIO2 40%. RT will reassess pt for readiness to wean to next phase in 30 minutes. RT will continue to monitor.

## 2018-12-17 NOTE — Progress Notes (Signed)
Pt called RN to room. Pt having hiccups for the 3rd time today. Pt also thought he was in afib. This RN turned how the epicardial pacemaker to see if the pacemaker was causing the hiccups. This RN obtained a EKG and EKG showed sinus rhythm with PACs. 5 mg of lopressor given to see if that would calm ecotpy. Pts pacemaker kept at 87 AAI in backup. Pt HR 78 at this time, irregular.

## 2018-12-17 NOTE — Progress Notes (Signed)
1 Day Post-Op Procedure(s) (LRB): AORTIC VALVE REPLACEMENT (AVR) (N/A) THORACIC ASCENDING ANEURYSM REPAIR (N/A) MAZE (N/A) TRANSESOPHAGEAL ECHOCARDIOGRAM (TEE) (N/A) CLIPPING OF ATRIAL APPENDAGE (Left) Subjective: No complaints except little pain with hiccups  Objective: Vital signs in last 24 hours: Temp:  [97 F (36.1 C)-99 F (37.2 C)] 99 F (37.2 C) (08/15 0800) Pulse Rate:  [66-128] 81 (08/15 0800) Cardiac Rhythm: Atrial paced (08/15 0800) Resp:  [9-22] 22 (08/15 0800) BP: (87-174)/(41-83) 121/77 (08/15 0800) SpO2:  [98 %-100 %] 98 % (08/15 0800) Arterial Line BP: (86-180)/(52-80) 133/52 (08/15 0600) FiO2 (%):  [40 %-50 %] 40 % (08/15 0338) Weight:  [85 kg] 85 kg (08/15 0500)  Hemodynamic parameters for last 24 hours: PAP: (24-72)/(9-40) 37/14 CO:  [2.9 L/min-5.7 L/min] 5.7 L/min CI:  [1.4 L/min/m2-2.8 L/min/m2] 2.8 L/min/m2  Intake/Output from previous day: 08/14 0701 - 08/15 0700 In: 6521.4 [I.V.:2951.9; Blood:2335.5; IV Piggyback:1234] Out: 5405 [Urine:3550; Blood:975; Chest Tube:880] Intake/Output this shift: Total I/O In: 61.8 [I.V.:61.8] Out: 125 [Urine:75; Chest Tube:50]  General appearance: alert, cooperative and no distress Neurologic: intact Heart: regular rate and rhythm, S1, S2 normal, no murmur, click, rub or gallop Lungs: clear to auscultation bilaterally Abdomen: soft, non-tender; bowel sounds normal; no masses,  no organomegaly Extremities: extremities normal, atraumatic, no cyanosis or edema Wound: dressed  Lab Results: Recent Labs    12/16/18 1950  12/17/18 0239 12/17/18 0400 12/17/18 0528  WBC 11.7*  --  11.3*  --   --   HGB 7.3*   < > 9.1* 8.8* 8.8*  HCT 22.3*   < > 26.9* 26.0* 26.0*  PLT 133*  --  120*  --   --    < > = values in this interval not displayed.   BMET:  Recent Labs    12/16/18 1950  12/17/18 0239 12/17/18 0400 12/17/18 0528  NA 140   < > 139 141 142  K 5.0   < > 4.6 4.7 4.4  CL 110  --  111  --   --   CO2 21*   --  21*  --   --   GLUCOSE 124*  --  133*  --   --   BUN 34*  --  34*  --   --   CREATININE 2.05*  --  2.03*  --   --   CALCIUM 8.0*  --  8.0*  --   --    < > = values in this interval not displayed.    PT/INR:  Recent Labs    12/16/18 1405  LABPROT 19.5*  INR 1.7*   ABG    Component Value Date/Time   PHART 7.395 12/17/2018 0528   HCO3 20.0 12/17/2018 0528   TCO2 21 (L) 12/17/2018 0528   ACIDBASEDEF 4.0 (H) 12/17/2018 0528   O2SAT 98.0 12/17/2018 0528   CBG (last 3)  Recent Labs    12/17/18 0605 12/17/18 0659 12/17/18 0801  GLUCAP 125* 115* 88    Assessment/Plan: S/P Procedure(s) (LRB): AORTIC VALVE REPLACEMENT (AVR) (N/A) THORACIC ASCENDING ANEURYSM REPAIR (N/A) MAZE (N/A) TRANSESOPHAGEAL ECHOCARDIOGRAM (TEE) (N/A) CLIPPING OF ATRIAL APPENDAGE (Left) Mobilize out of bed  Remove pa catheter   LOS: 1 day    [name] 12/17/2018

## 2018-12-17 NOTE — Procedures (Signed)
Chest Tube Insertion Procedure Note  Indications:  Clinically significant Pneumothorax  Pre-operative Diagnosis: Pneumothorax  Post-operative Diagnosis: Pneumothorax  Procedure Details  Informed consent was obtained for the procedure, including sedation.  Risks of lung perforation, hemorrhage, arrhythmia, and adverse drug reaction were discussed.   After sterile skin prep, using standard technique, a 14 French tube was placed in the right anterior 5th rib space.  Findings: Small return of air and pleural fluid  Estimated Blood Loss:  Minimal         Specimens:  None              Complications:  None; patient tolerated the procedure well.         Disposition: ICU - extubated and stable.         Condition: stable  Attending Attestation: I performed the procedure.

## 2018-12-17 NOTE — Progress Notes (Signed)
Pt ABG results as follows. RT will continue with NIF and VC to determine extubation eligibility.   Results for Joe Klein, Joe Klein "CHRIS" (MRN [id number]) as of 12/17/2018 04:19  Ref. Range 12/17/2018 04:00  Sample type Unknown ARTERIAL  pH, Arterial Latest Ref Range: 7.350 - 7.450  7.394  pCO2 arterial Latest Ref Range: 32.0 - 48.0 mmHg 34.5  pO2, Arterial Latest Ref Range: 83.0 - 108.0 mmHg 155.0 (H)  TCO2 Latest Ref Range: 22 - 32 mmol/L 22  Acid-base deficit Latest Ref Range: 0.0 - 2.0 mmol/L 3.0 (H)  Bicarbonate Latest Ref Range: 20.0 - 28.0 mmol/L 21.1  O2 Saturation Latest Units: % 99.0  Patient temperature Unknown 36.9 C

## 2018-12-17 NOTE — Op Note (Signed)
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 Zane MD  Assistant: Gershon CraneGold, Wayne PA-C  Anesthesia: GET  Operative Findings:  Hypertrophied and dilated left ventricule  Well-seated aortic prosthesis    BRIEF CLINICAL NOTE AND INDICATIONS FOR SURGERY  Mr. Joe Klein is a 58 year old gentleman who presented to the medical system in June of this year with heart failure symptoms.  He had pulmonary effusions and paroxysmal nocturnal dyspnea.  He underwent medical therapy and an evaluation which demonstrated moderate aortic valve disease and ascending aortic aneurysm.  The work-up demonstrated no coronary disease but paroxysmal atrial fibrillation.  He now presents for definitive repair.   DETAILS OF THE OPERATIVE PROCEDURE  Preparation:  The patient is brought to the operating room on the above mentioned date and central monitoring was established by the anesthesia team including placement of Swan-Ganz catheter and radial arterial line. The patient is placed in the supine position on the operating table.  Intravenous antibiotics are administered. General endotracheal anesthesia is induced uneventfully. A Foley catheter is placed.  Baseline transesophageal echocardiogram was performed.  Findings were notable for moderate AS and dilated LV with myocardial hypertrophy  The patient's chest, abdomen,  both groins, and both lower extremities are prepared and draped in a sterile manner. A time out procedure is performed.   Surgical Approach and Extracorporeal Cardiopulmonary Bypass:  A median sternotomy incision was performed The pericardium is opened.  A pericardial well is created. The ascending aorta is dilated in appearance.  Temps were made to access the innominate artery but this were difficult due to the high riding nature of it which made it unsafe to do so.  Therefore attention was turned to the right infraclavicular fossa where an incision was made overlying the deltopectoral groove.  The pectoralis major and minor muscles were divided.  The right axillary artery was encircled and 5000 units of heparin were given intravenously.  A 10 mm dacryon graft was brought onto the field and sewn to the right axillary artery with a running suture of 5-0 Prolene.  The graft was then de-aired and attached to the arterial limb of the cardiopulmonary bypass machine.  Attention was returned to the mediastinum. Full dose heparin was given centrally. The right atrium is cannulated for cardiopulmonary bypass.  Adequate heparinization is verified and the patient was placed on CPB. Cooling on CPB was performed to a goal temp of 25 deg Celsius.   The right-sided pulmonary veins were encircled and then RFA clamp was placed around the vessels to create pulmonary vein isolation on the right. A retrograde cardioplegia cannula is placed through the right atrium into the coronary sinus. A LV vent was inserted through the right superior pulmonary vein. A cardioplegia cannula is placed in the ascending aorta. Carbon dioxide was blown into the field.  The aortic cross clamp is applied and cold blood cardioplegia is delivered initially in an antegrade fashion through the aortic root.  Supplemental cardioplegia is given retrograde through the coronary sinus catheter.  Iced saline slush is applied for topical hypothermia.  The  initial cardioplegic arrest is rapid with early diastolic arrest.  Repeat doses of cardioplegia are administered intermittently throughout the entire cross clamp portion of the operation through the aortic root and through the coronary sinus catheter  in order to maintain completely flat electrocardiogram. Myocardial protection was felt to be excellent. Completion of Pulm vein isolation: The ligament of Ruthann Cancer was divided with electrocautery.  This allowed passage of a clamp beneath the left-sided pulmonary veins which were then encircled with the RFA clamp.  Neri vein isolation was undertaken on the left.  When this completed the left atrial appendage was opened and the clamp was placed within the appendage and onto the left upper vein.  Next the left atrial appendage was clipped with an atriclip device (50 mm).  The defect in the left atrial appendage was oversewn with running suture of 4-0 Prolene.  Replacement of ascending aorta and AVR: The aorta was opened.  The aneurysmal tissue was removed down to the level of the sinotubular junction.  The aortic valve was bicuspid congenitally with fusion of the right and non-cusps to create a raphe.  This cusp was heavily calcified.  The other cusp was not as diseased.  Both cusps were excised.  By this time the cooling to the goal temperature was achieved and had been active for 45 minutes.  Therefore the heart-lung machine was turned off.  The a sending aorta was excised underneath the arch.  The proximal innominate artery was clamped as was the left carotid artery and antegrade cerebral perfusion was undertaken.  Cerebral saturations were monitored.  A 28 mm Dacron graft was cut to fit the underside of the arch and sewn in a hemi-arch reconstruction with a running suture of 3-0 Prolene.  This was buttressed on the native aortic side with felt strips.  When this was completed the neoaorta was de-aired and reclamped and blood flow was reestablished to the brain and  to the body.  The cervical rest time was 17 minutes with 15 minutes of antegrade cerebral perfusion.  Rewarming was then started.  Attention was returned to the aortic valve replacement.  The annulus sized at a 23 mm prosthesis using the On-X sizers.  Inferential sutures of 2-0 Ethibond with pledgets were placed on the ventricular side.  These were then brought through the sewing cuff of the 23 mm mechanical valve and the valve was placed within the annulus.  The sutures were tied with cor-knot crimping devices.  The valve seated well.  Next the 28 mm graft was sewn into into the sinotubular junction with a running suture of 3-0 Prolene.  Care was taken to avoid impingement on the coronary ostia which were far away.  Next the graft graft anastomosis was done using a running suture of 4-0 Prolene.  When this completed a needle cannula was reinserted into the neoaorta and a reanimation dose of cardioplegia was given retrograde.  De-airing procedures were performed.  The aortic cross-clamp was removed.  Sites were inspected for hemostasis.  Aggressive rewarming was undertaken.  Procedure Completion:   The patient is rewarmed to 37C temperature. The patient is weaned and disconnected from cardiopulmonary bypass.  The patient's rhythm at separation from bypass was NSR.  The patient was weaned from cardiopulmonary bypass  without any inotropic support. T Followup transesophageal echocardiogram performed after separation from bypass revealed  no changes from the preoperative exam and no paravalvular leaks  The arterial and venous cannulae were removed uneventfully. Protamine was administered to  reverse the anticoagulation. The mediastinum was inspected for hemostasis and irrigated with saline solution. The mediastinum was drained with 28 Fr fluted chest tubes placed through separate stab incisions inferiorly.  The soft tissues anterior to the aorta were reapproximated loosely. The sternum is closed with double  strength sternal wire. The soft tissues anterior to the sternum were closed in multiple layers and the skin is closed with a running subcuticular skin closure.  The post-bypass portion of the operation was notable for stable rhythm and hemodynamics.    Disposition:  The patient tolerated the procedure well and is transported to the surgical intensive care in stable condition. There are no intraoperative complications. All sponge instrument and needle counts are verified correct at completion of the operation.    Brantley FlingB. Zane Jermal Dismuke,  MD, FACS 12/17/2018 10:55 AM

## 2018-12-18 ENCOUNTER — Inpatient Hospital Stay (HOSPITAL_COMMUNITY): Payer: Medicaid Other

## 2018-12-18 LAB — PROTIME-INR
INR: 1.3 — ABNORMAL HIGH (ref 0.8–1.2)
Prothrombin Time: 15.6 seconds — ABNORMAL HIGH (ref 11.4–15.2)

## 2018-12-18 LAB — GLUCOSE, CAPILLARY
Glucose-Capillary: 123 mg/dL — ABNORMAL HIGH (ref 70–99)
Glucose-Capillary: 112 mg/dL — ABNORMAL HIGH (ref 70–99)
Glucose-Capillary: 88 mg/dL (ref 70–99)
Glucose-Capillary: 84 mg/dL (ref 70–99)
Glucose-Capillary: 87 mg/dL (ref 70–99)

## 2018-12-18 LAB — BASIC METABOLIC PANEL
Anion gap: 7 (ref 5–15)
BUN: 29 mg/dL — ABNORMAL HIGH (ref 6–20)
CO2: 24 mmol/L (ref 22–32)
Calcium: 7.9 mg/dL — ABNORMAL LOW (ref 8.9–10.3)
Chloride: 106 mmol/L (ref 98–111)
Creatinine, Ser: 1.83 mg/dL — ABNORMAL HIGH (ref 0.61–1.24)
GFR calc Af Amer: 46 mL/min — ABNORMAL LOW (ref >60)
GFR calc non Af Amer: 40 mL/min — ABNORMAL LOW (ref >60)
Glucose, Bld: 125 mg/dL — ABNORMAL HIGH (ref 70–99)
Potassium: 4 mmol/L (ref 3.5–5.1)
Sodium: 137 mmol/L (ref 135–145)

## 2018-12-18 LAB — CBC
HCT: 21.7 % — ABNORMAL LOW (ref 39.0–52.0)
Hemoglobin: 7.2 g/dL — ABNORMAL LOW (ref 13.0–17.0)
MCH: 28.3 pg (ref 26.0–34.0)
MCHC: 33.2 g/dL (ref 30.0–36.0)
MCV: 85.4 fL (ref 80.0–100.0)
Platelets: 97 10*3/uL — ABNORMAL LOW (ref 150–400)
RBC: 2.54 MIL/uL — ABNORMAL LOW (ref 4.22–5.81)
RDW: 14.7 % (ref 11.5–15.5)
WBC: 12.5 10*3/uL — ABNORMAL HIGH (ref 4.0–10.5)
nRBC: 0 % (ref 0.0–0.2)

## 2018-12-18 MED ORDER — WARFARIN SODIUM 2 MG PO TABS
2.0000 mg | ORAL_TABLET | Freq: Once | ORAL | Status: AC
  Administered 2018-12-18: 17:00:00 2 mg via ORAL
  Filled 2018-12-18: qty 1

## 2018-12-18 MED ORDER — AMIODARONE HCL 200 MG PO TABS
400.0000 mg | ORAL_TABLET | Freq: Two times a day (BID) | ORAL | Status: DC
  Administered 2018-12-18 – 2018-12-25 (×14): 400 mg via ORAL
  Filled 2018-12-18 (×14): qty 2

## 2018-12-18 MED ORDER — FUROSEMIDE 10 MG/ML IJ SOLN
60.0000 mg | Freq: Two times a day (BID) | INTRAMUSCULAR | Status: DC
  Administered 2018-12-18 (×2): 60 mg via INTRAVENOUS
  Filled 2018-12-18 (×2): qty 6

## 2018-12-18 MED ORDER — ASPIRIN EC 81 MG PO TBEC
81.0000 mg | DELAYED_RELEASE_TABLET | Freq: Every day | ORAL | Status: DC
  Administered 2018-12-18 – 2018-12-25 (×8): 81 mg via ORAL
  Filled 2018-12-18 (×8): qty 1

## 2018-12-18 MED ORDER — METOPROLOL TARTRATE 25 MG PO TABS
25.0000 mg | ORAL_TABLET | Freq: Two times a day (BID) | ORAL | Status: DC
  Administered 2018-12-18 – 2018-12-21 (×7): 25 mg via ORAL
  Filled 2018-12-18 (×7): qty 1

## 2018-12-18 MED ORDER — WARFARIN - PHARMACIST DOSING INPATIENT
Freq: Every day | Status: DC
  Administered 2018-12-24: 19:00:00

## 2018-12-18 MED ORDER — INSULIN ASPART 100 UNIT/ML ~~LOC~~ SOLN
0.0000 [IU] | SUBCUTANEOUS | Status: DC
  Administered 2018-12-18: 03:00:00 2 [IU] via SUBCUTANEOUS

## 2018-12-18 NOTE — Progress Notes (Signed)
White standing the patient to be weighted, he turned pale, stopped speaking to the nurse and his knees started to buckle. The nurse was able to assist him back into bed. He did not respond to the nurses for a few seconds and then was able to answer all questions appropriately. He has no recollection of the event.

## 2018-12-18 NOTE — Progress Notes (Signed)
Offered to provid prayer shawl and spiritual resources. Pt gratefully accepted.    Minus Liberty, [place]

## 2018-12-18 NOTE — Progress Notes (Signed)
Chaplain responded to request for prayer by pt. Pt was sitting up in bed, alert, polite, but complains of mental fog (from anesthesia) and spiritual malaise, specifically, disconnected from God.  Stated, "I feel like Job," that faith is being tested. He is overwhelmed by ongoing physical challenges, and feels spiritually and mentally vulnerable despite many efforts to be hopeful. "I know God is with me, but..."  Pt stated he became active in [place], was "born again"/baptized four years ago.  Belongs to [employer] on [employer] ([employer]); however, he has been unable to attend due to being immunocompromised.    Chaplain facilitated storytelling and active listening, providing unconditional, positive regard and reflecting pt's struggle to feel God's presence.  Faith is a very key resource to this pt. Christian music ("Love God, Love People" and "[place]") and spiritual programing (e.g. "The Chosen") are primary modes of encouragement.  When asked about network of support, pt stated he does have a supportive community, but that Covid visitor restrictions make him feel very lonely.  Friends are checking in on him, but he does not have energy to respond to them. Pt appears to be extroverted, valuing physical contact, misses hugs and handshakes from before Covid 19.  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  Chaplain encouraged pt to focus on breath, and resting in God's spirit, and to slowly let go of anxious thoughts.  Provided spiritual metaphors to support pt in letting go of trying to be a model [place] who does not falter in hopelessness (since pt appears to convict himself of this). With permission from pt., chaplain donned gloves to join hands during prayer, concluding in Lord's prayer, which pt verbally joined. Pt also accepted blessing and anointing with oil as a  symbol of God's calling for pt to think/worry less, and breathe/rest/receive care more.  Oriented pt to ongoing availability of spiritual care services. Pt expressed gratitude for visit, prayer.  Please monitor pt for desire for repeat visit from spiritual care.    Minus Liberty, [place]    12/18/18 1000  Clinical Encounter Type  Visited With Patient  Visit Type Initial;Spiritual support  Referral From Patient  Consult/Referral To Chaplain  Spiritual Encounters  Spiritual Needs Ritual;Prayer;Emotional  Stress Factors  Patient Stress Factors Loss of control;Health changes

## 2018-12-18 NOTE — Progress Notes (Signed)
ANTICOAGULATION CONSULT NOTE - Initial Consult  Pharmacy Consult for warfarin Indication: Mechanical AVR - new 8/14  No Known Allergies  Patient Measurements: Height: 6' (182.9 cm) Weight: 188 lb 4.4 oz (85.4 kg) IBW/kg (Calculated) : 77.6  Vital Signs: Temp: 98.5 F (36.9 C) (08/16 1121) Temp Source: Oral (08/16 1121) BP: 151/81 (08/16 1100) Pulse Rate: 79 (08/16 1100)  Labs: Recent Labs    12/16/18 1405  12/17/18 0239  12/17/18 0528 12/17/18 1647 12/18/18 0343  HGB 8.9*   < > 9.1*   < > 8.8* 9.1* 7.2*  HCT 27.6*   < > 26.9*   < > 26.0* 27.2* 21.7*  PLT 72*   < > 120*  --   --  106* 97*  APTT 39*  --   --   --   --   --   --   LABPROT 19.5*  --   --   --   --   --   --   INR 1.7*  --   --   --   --   --   --   CREATININE  --    < > 2.03*  --   --  1.96* 1.83*   < > = values in this interval not displayed.    Estimated Creatinine Clearance: 47.7 mL/min (A) (by C-G formula based on SCr of 1.83 mg/dL (H)).   Medical History: Past Medical History:  Diagnosis Date  . AAA (abdominal aortic aneurysm) ([place])   . Aortic stenosis   . CHF (congestive heart failure) ([place])   . Chronic kidney disease    stage 3  . Dysrhythmia    A-fib  . Hypertension       Assessment: 58yom s/p mechanical AVR/ thoracic aneurysm repair 8/14 8/15 amio started PACs/ectopy - f/u LOT plan pltc h/h lower today - watch - no overt bleeding,  CT drainage - monitor Start low dose warfarin   Goal of Therapy:  INR 2-3 Monitor platelets by anticoagulation protocol: Yes   Plan:  warfarin 2mg  x1 today Daily protime  Monitor s/s bleeding   Bonnita Nasuti Pharm.D. CPP, BCPS Clinical Pharmacist [phone] 12/18/2018 12:08 PM

## 2018-12-18 NOTE — Progress Notes (Signed)
2 Days Post-Op Procedure(s) (LRB): AORTIC VALVE REPLACEMENT (AVR) (N/A) THORACIC ASCENDING ANEURYSM REPAIR (N/A) MAZE (N/A) TRANSESOPHAGEAL ECHOCARDIOGRAM (TEE) (N/A) CLIPPING OF ATRIAL APPENDAGE (Left) Subjective: Doing well  Objective: Vital signs in last 24 hours: Temp:  [98.3 F (36.8 C)-98.8 F (37.1 C)] 98.3 F (36.8 C) (08/16 0757) Pulse Rate:  [61-117] 70 (08/16 0800) Cardiac Rhythm: Ventricular paced (08/16 0800) Resp:  [12-24] 20 (08/16 0800) BP: (107-166)/(54-93) 147/72 (08/16 0735) SpO2:  [93 %-100 %] 99 % (08/16 0800) Weight:  [85 kg-85.4 kg] 85.4 kg (08/16 0500)  Hemodynamic parameters for last 24 hours:    Intake/Output from previous day: 08/15 0701 - 08/16 0700 In: 1449.3 [P.O.:480; I.V.:969.3] Out: 2465 [Urine:1705; Chest Tube:760] Intake/Output this shift: Total I/O In: 276.7 [P.O.:240; I.V.:36.7] Out: -   General appearance: alert, cooperative and no distress Neurologic: intact Heart: regular rate and rhythm, S1, S2 normal, no murmur, click, rub or gallop Lungs: clear to auscultation bilaterally Extremities: extremities normal, atraumatic, no cyanosis or edema Wound: dressed  Lab Results: Recent Labs    12/17/18 1647 12/18/18 0343  WBC 12.7* 12.5*  HGB 9.1* 7.2*  HCT 27.2* 21.7*  PLT 106* 97*   BMET:  Recent Labs    12/17/18 1647 12/18/18 0343  NA 138 137  K 4.1 4.0  CL 106 106  CO2 22 24  GLUCOSE 158* 125*  BUN 31* 29*  CREATININE 1.96* 1.83*  CALCIUM 8.1* 7.9*    PT/INR:  Recent Labs    12/16/18 1405  LABPROT 19.5*  INR 1.7*   ABG    Component Value Date/Time   PHART 7.395 12/17/2018 0528   HCO3 20.0 12/17/2018 0528   TCO2 21 (L) 12/17/2018 0528   ACIDBASEDEF 4.0 (H) 12/17/2018 0528   O2SAT 98.0 12/17/2018 0528   CBG (last 3)  Recent Labs    12/17/18 2329 12/18/18 0314 12/18/18 0800  GLUCAP 122* 123* 112*    Assessment/Plan: S/P Procedure(s) (LRB): AORTIC VALVE REPLACEMENT (AVR) (N/A) THORACIC ASCENDING  ANEURYSM REPAIR (N/A) MAZE (N/A) TRANSESOPHAGEAL ECHOCARDIOGRAM (TEE) (N/A) CLIPPING OF ATRIAL APPENDAGE (Left) Mobilize Diuresis warfarin for mechanical AVR (on-X)-- the goal INR should not be more than 2.5 unless in afib   LOS: 2 days    [name] 12/18/2018

## 2018-12-19 ENCOUNTER — Inpatient Hospital Stay (HOSPITAL_COMMUNITY): Payer: Medicaid Other

## 2018-12-19 ENCOUNTER — Encounter (HOSPITAL_COMMUNITY): Payer: Self-pay | Admitting: Cardiothoracic Surgery

## 2018-12-19 LAB — CBC WITH DIFFERENTIAL/PLATELET
Abs Immature Granulocytes: 0.07 10*3/uL (ref 0.00–0.07)
Basophils Absolute: 0 10*3/uL (ref 0.0–0.1)
Basophils Relative: 0 %
Eosinophils Absolute: 0.2 10*3/uL (ref 0.0–0.5)
Eosinophils Relative: 1 %
HCT: 22.3 % — ABNORMAL LOW (ref 39.0–52.0)
Hemoglobin: 7.4 g/dL — ABNORMAL LOW (ref 13.0–17.0)
Immature Granulocytes: 1 %
Lymphocytes Relative: 12 %
Lymphs Abs: 1.4 10*3/uL (ref 0.7–4.0)
MCH: 28.4 pg (ref 26.0–34.0)
MCHC: 33.2 g/dL (ref 30.0–36.0)
MCV: 85.4 fL (ref 80.0–100.0)
Monocytes Absolute: 1.2 10*3/uL — ABNORMAL HIGH (ref 0.1–1.0)
Monocytes Relative: 10 %
Neutro Abs: 8.9 10*3/uL — ABNORMAL HIGH (ref 1.7–7.7)
Neutrophils Relative %: 76 %
Platelets: 115 10*3/uL — ABNORMAL LOW (ref 150–400)
RBC: 2.61 MIL/uL — ABNORMAL LOW (ref 4.22–5.81)
RDW: 14.7 % (ref 11.5–15.5)
WBC: 11.7 10*3/uL — ABNORMAL HIGH (ref 4.0–10.5)
nRBC: 0 % (ref 0.0–0.2)

## 2018-12-19 LAB — BASIC METABOLIC PANEL
Anion gap: 9 (ref 5–15)
BUN: 29 mg/dL — ABNORMAL HIGH (ref 6–20)
CO2: 27 mmol/L (ref 22–32)
Calcium: 8 mg/dL — ABNORMAL LOW (ref 8.9–10.3)
Chloride: 100 mmol/L (ref 98–111)
Creatinine, Ser: 1.96 mg/dL — ABNORMAL HIGH (ref 0.61–1.24)
GFR calc Af Amer: 42 mL/min — ABNORMAL LOW (ref >60)
GFR calc non Af Amer: 36 mL/min — ABNORMAL LOW (ref >60)
Glucose, Bld: 121 mg/dL — ABNORMAL HIGH (ref 70–99)
Potassium: 3.5 mmol/L (ref 3.5–5.1)
Sodium: 136 mmol/L (ref 135–145)

## 2018-12-19 LAB — GLUCOSE, CAPILLARY
Glucose-Capillary: 106 mg/dL — ABNORMAL HIGH (ref 70–99)
Glucose-Capillary: 112 mg/dL — ABNORMAL HIGH (ref 70–99)
Glucose-Capillary: 81 mg/dL (ref 70–99)
Glucose-Capillary: 84 mg/dL (ref 70–99)
Glucose-Capillary: 86 mg/dL (ref 70–99)
Glucose-Capillary: 93 mg/dL (ref 70–99)
Glucose-Capillary: 97 mg/dL (ref 70–99)

## 2018-12-19 MED ORDER — POTASSIUM CHLORIDE CRYS ER 20 MEQ PO TBCR
20.0000 meq | EXTENDED_RELEASE_TABLET | ORAL | Status: AC
  Administered 2018-12-19 (×3): 20 meq via ORAL
  Filled 2018-12-19 (×3): qty 1

## 2018-12-19 MED ORDER — FE FUMARATE-B12-VIT C-FA-IFC PO CAPS
1.0000 | ORAL_CAPSULE | Freq: Three times a day (TID) | ORAL | Status: DC
  Administered 2018-12-19 – 2018-12-25 (×20): 1 via ORAL
  Filled 2018-12-19 (×20): qty 1

## 2018-12-19 MED ORDER — WARFARIN SODIUM 2 MG PO TABS
2.0000 mg | ORAL_TABLET | Freq: Once | ORAL | Status: AC
  Administered 2018-12-19: 2 mg via ORAL
  Filled 2018-12-19: qty 1

## 2018-12-19 MED ORDER — FUROSEMIDE 10 MG/ML IJ SOLN
40.0000 mg | Freq: Two times a day (BID) | INTRAMUSCULAR | Status: DC
  Administered 2018-12-19 (×2): 40 mg via INTRAVENOUS
  Filled 2018-12-19 (×3): qty 4

## 2018-12-19 MED FILL — Sodium Chloride IV Soln 0.9%: INTRAVENOUS | Qty: 2000 | Status: AC

## 2018-12-19 MED FILL — Heparin Sodium (Porcine) Inj 1000 Unit/ML: INTRAMUSCULAR | Qty: 30 | Status: AC

## 2018-12-19 MED FILL — Electrolyte-R (PH 7.4) Solution: INTRAVENOUS | Qty: 3000 | Status: AC

## 2018-12-19 MED FILL — Heparin Sodium (Porcine) Inj 1000 Unit/ML: INTRAMUSCULAR | Qty: 10 | Status: AC

## 2018-12-19 MED FILL — Lidocaine HCl Local Soln Prefilled Syringe 100 MG/5ML (2%): INTRAMUSCULAR | Qty: 5 | Status: AC

## 2018-12-19 MED FILL — Lidocaine HCl Local Preservative Free (PF) Inj 2%: INTRAMUSCULAR | Qty: 15 | Status: AC

## 2018-12-19 MED FILL — Potassium Chloride Inj 2 mEq/ML: INTRAVENOUS | Qty: 20 | Status: AC

## 2018-12-19 MED FILL — Electrolyte-R (PH 7.4) Solution: INTRAVENOUS | Qty: 5000 | Status: AC

## 2018-12-19 MED FILL — Sodium Bicarbonate IV Soln 8.4%: INTRAVENOUS | Qty: 50 | Status: AC

## 2018-12-19 MED FILL — Mannitol IV Soln 20%: INTRAVENOUS | Qty: 500 | Status: AC

## 2018-12-19 NOTE — Progress Notes (Signed)
TCTS DAILY ICU PROGRESS NOTE                   [address]            [place] [address]          [phone]   3 Days Post-Op Procedure(s) (LRB): AORTIC VALVE REPLACEMENT (AVR) (N/A) THORACIC ASCENDING ANEURYSM REPAIR (N/A) MAZE (N/A) TRANSESOPHAGEAL ECHOCARDIOGRAM (TEE) (N/A) CLIPPING OF ATRIAL APPENDAGE (Left)  Total Length of Stay:  LOS: 3 days   Subjective: Feels poorly this morning. Feels "not himself". He is tearful and cannot really describe how he is feeling. He was dizzy and lightheaded when he stood up this morning. Does not want to leave the bed and try the chair. He feels weak.   Objective: Vital signs in last 24 hours: Temp:  [97.6 F (36.4 C)-99 F (37.2 C)] 97.6 F (36.4 C) (08/17 0800) Pulse Rate:  [71-81] 71 (08/17 0800) Cardiac Rhythm: Ventricular paced (08/17 0400) Resp:  [13-23] 17 (08/17 0800) BP: (106-160)/(61-81) 135/68 (08/17 0800) SpO2:  [93 %-100 %] 100 % (08/17 0800) Weight:  [83 kg] 83 kg (08/17 0600)  Filed Weights   12/17/18 1641 12/18/18 0500 12/19/18 0600  Weight: 85 kg 85.4 kg 83 kg    Weight change: -2 kg   Hemodynamic parameters for last 24 hours:    Intake/Output from previous day: 08/16 0701 - 08/17 0700 In: 2239.2 [P.O.:1560; I.V.:679.2] Out: 5185 [Urine:4875; Chest Tube:310]  Intake/Output this shift: Total I/O In: 20 [I.V.:20] Out: 201 [Urine:150; Stool:1; Chest Tube:50]  Current Meds: Scheduled Meds: . acetaminophen  1,000 mg Oral Q6H  . amiodarone  400 mg Oral BID  . aspirin EC  81 mg Oral Daily  . bisacodyl  10 mg Oral Daily  . Chlorhexidine Gluconate Cloth  6 each Topical Daily  . docusate sodium  200 mg Oral Daily  . ferrous [id number] C-folic acid  1 capsule Oral TID PC  . furosemide  40 mg Intravenous BID  . insulin aspart  0-24 Units Subcutaneous Q4H  . mouth rinse  15 mL Mouth Rinse BID  . metoprolol tartrate  25 mg Oral BID  . pantoprazole  40 mg Oral Daily  . potassium  chloride  20 mEq Oral Q4H  . sodium chloride flush  10-40 mL Intracatheter Q12H  . sodium chloride flush  3 mL Intravenous Q12H  . warfarin  2 mg Oral ONCE-1800  . Warfarin - Pharmacist Dosing Inpatient   Does not apply q1800   Continuous Infusions: . lactated ringers 20 mL/hr at 12/19/18 0800   PRN Meds:.chlorproMAZINE, metoprolol tartrate, ondansetron (ZOFRAN) IV, oxyCODONE, simethicone, sodium chloride flush, sodium chloride flush, traMADol  General appearance: alert, cooperative and no distress Heart: regular rate and rhythm, S1, S2 normal, no murmur, click, rub or gallop Lungs: clear to auscultation bilaterally Abdomen: soft, non-tender; bowel sounds normal; no masses,  no organomegaly Extremities: extremities normal, atraumatic, no cyanosis or edema Wound: clean and dry  Lab Results: CBC: Recent Labs    12/18/18 0343 12/19/18 0351  WBC 12.5* 11.7*  HGB 7.2* 7.4*  HCT 21.7* 22.3*  PLT 97* 115*   BMET:  Recent Labs    12/18/18 0343 12/19/18 0351  NA 137 136  K 4.0 3.5  CL 106 100  CO2 24 27  GLUCOSE 125* 121*  BUN 29* 29*  CREATININE 1.83* 1.96*  CALCIUM 7.9* 8.0*    CMET: Lab Results  Component Value Date   WBC 11.7 (  H) 12/19/2018   HGB 7.4 (L) 12/19/2018   HCT 22.3 (L) 12/19/2018   PLT 115 (L) 12/19/2018   GLUCOSE 121 (H) 12/19/2018   ALT 32 12/12/2018   AST 20 12/12/2018   NA 136 12/19/2018   K 3.5 12/19/2018   CL 100 12/19/2018   CREATININE 1.96 (H) 12/19/2018   BUN 29 (H) 12/19/2018   CO2 27 12/19/2018   INR 1.3 (H) 12/18/2018   HGBA1C 5.9 (H) 12/12/2018      PT/INR:  Recent Labs    12/18/18 1223  LABPROT 15.6*  INR 1.3*   Radiology: Dg Chest 1 View  Result Date: 12/19/2018 CLINICAL DATA:  Chest tube placement. EXAM: CHEST  1 VIEW COMPARISON:  12/18/2018 FINDINGS: Grossly unchanged enlarged cardiac silhouette and mediastinal contours post median sternotomy. Stable positioning of support apparatus with interval reduction in size of  persistent tiny right apical pneumothorax. Trace left-sided effusion and associated left basilar opacities are unchanged. No new focal airspace opacities. No evidence of edema. Surgical clips overlie the right axilla. No definite acute osseous abnormalities. IMPRESSION: 1. Stable position of support apparatus with decrease in size of tiny residual right apical pneumothorax. 2. Similar findings of cardiomegaly, trace left-sided effusion associated left basilar opacities, atelectasis versus infiltrate. Electronically Signed   By: Simonne ComeJohn  Watts M.D.   On: 12/19/2018 07:34     Assessment/Plan: S/P Procedure(s) (LRB): AORTIC VALVE REPLACEMENT (AVR) (N/A) THORACIC ASCENDING ANEURYSM REPAIR (N/A) MAZE (N/A) TRANSESOPHAGEAL ECHOCARDIOGRAM (TEE) (N/A) CLIPPING OF ATRIAL APPENDAGE (Left)  1. CV- hx of Atrial fibrillation. Now in NSR. On PO Amio 400mg  BID. Continue Lopressor, ASA, and Coumadin. Goal INR is 2.5. Current INR is 1.3.  He is on 2mg  of Coumadin-dosing per pharmacy.  2. Pulm-On room air with excellent saturation. CXR shows decrease in tiny residual right apical pneumo. Trace left-sided effusion and left basilar opacities. Continue incentive spirometer.  3. Renal-CKD- Creatinine 1.83 which is about baseline. Continue Lasix BID.  4. H and H -7.4/22.3, expected acute blood loss anemia. Will trend closely.  5. Endo-blood glucose well controlled. Continue current regimen.   Plan: Limit narcotics. Still feeling weak and light-headed. Not wanting to walk far due to feeling poorly. Might consider giving some blood-Will discuss with Dr. Vickey SagesAtkins.      Sharlene Doryessa N Pete Schnitzer 12/19/2018 9:31 AM

## 2018-12-19 NOTE — Progress Notes (Signed)
Upon standing in preparation for morning walk, patient expressed dizziness. No morning walk due to this. Patient in chair and vital signs are stable.

## 2018-12-19 NOTE — Consult Note (Signed)
Stopped by to F/U after chaplain's visit yesterday, but pt unavailable. Please call if pt desires further chaplain services.  Rev. Eloise Levels Chaplain

## 2018-12-19 NOTE — Progress Notes (Signed)
ANTICOAGULATION CONSULT NOTE - Initial Consult  Pharmacy Consult for warfarin Indication: Mechanical AVR - new 8/14  No Known Allergies  Patient Measurements: Height: 6' (182.9 cm) Weight: 182 lb 15.7 oz (83 kg) IBW/kg (Calculated) : 77.6  Vital Signs: Temp: 97.6 F (36.4 C) (08/17 0800) Temp Source: Oral (08/17 0800) BP: 135/68 (08/17 0800) Pulse Rate: 71 (08/17 0800)  Labs: Recent Labs    12/16/18 1405  12/17/18 1647 12/18/18 0343 12/18/18 1223 12/19/18 0351  HGB 8.9*   < > 9.1* 7.2*  --  7.4*  HCT 27.6*   < > 27.2* 21.7*  --  22.3*  PLT 72*   < > 106* 97*  --  115*  APTT 39*  --   --   --   --   --   LABPROT 19.5*  --   --   --  15.6*  --   INR 1.7*  --   --   --  1.3*  --   CREATININE  --    < > 1.96* 1.83*  --  1.96*   < > = values in this interval not displayed.    Estimated Creatinine Clearance: 44.5 mL/min (A) (by C-G formula based on SCr of 1.96 mg/dL (H)).   Medical History: Past Medical History:  Diagnosis Date  . AAA (abdominal aortic aneurysm) ([place])   . Aortic stenosis   . CHF (congestive heart failure) ([place])   . Chronic kidney disease    stage 3  . Dysrhythmia    A-fib  . Hypertension       Assessment: 58yom s/p on-X mechanical AVR/ thoracic aneurysm repair 8/14. Pharmacy consulted to dose warfarin -INR= 1.3, Hg= 7.4, plt= 115   Goal of Therapy:  2-3 for the first 3 months then 1.5-2.0 Monitor platelets by anticoagulation protocol: Yes   Plan:  warfarin 2mg  x1 today Daily protime   Hildred Laser, PharmD Clinical Pharmacist **Pharmacist phone directory can now be found on [email or web address] ([email or web address]).  Listed under [place].

## 2018-12-19 NOTE — Progress Notes (Signed)
EVENING ROUNDS NOTE :     [address]       [place] [address]             [phone]                 3 Days Post-Op Procedure(s) (LRB): AORTIC VALVE REPLACEMENT (AVR) (N/A) THORACIC ASCENDING ANEURYSM REPAIR (N/A) MAZE (N/A) TRANSESOPHAGEAL ECHOCARDIOGRAM (TEE) (N/A) CLIPPING OF ATRIAL APPENDAGE (Left)   Total Length of Stay:  LOS: 3 days  Events:  No events.  Resting comfortably.  sinus    BP 138/67   Pulse 67   Temp 98.1 F (36.7 C) (Oral)   Resp 20   Ht 6' (1.829 m)   Wt 83 kg   SpO2 99%   BMI 24.82 kg/m         . lactated ringers Stopped (12/19/18 0811)    I/O last 3 completed shifts: In: 2708.1 [P.O.:1560; I.V.:1148.1] Out: 7062 [Urine:5555; Chest Tube:590]   CBC Latest Ref Rng & Units 12/19/2018 12/18/2018 12/17/2018  WBC 4.0 - 10.5 K/uL 11.7(H) 12.5(H) 12.7(H)  Hemoglobin 13.0 - 17.0 g/dL 7.4(L) 7.2(L) 9.1(L)  Hematocrit 39.0 - 52.0 % 22.3(L) 21.7(L) 27.2(L)  Platelets 150 - 400 K/uL 115(L) 97(L) 106(L)    BMP Latest Ref Rng & Units 12/19/2018 12/18/2018 12/17/2018  Glucose 70 - 99 mg/dL 121(H) 125(H) 158(H)  BUN 6 - 20 mg/dL 29(H) 29(H) 31(H)  Creatinine 0.61 - 1.24 mg/dL 1.96(H) 1.83(H) 1.96(H)  BUN/Creat Ratio 9 - 20 - - -  Sodium 135 - 145 mmol/L 136 137 138  Potassium 3.5 - 5.1 mmol/L 3.5 4.0 4.1  Chloride 98 - 111 mmol/L 100 106 106  CO2 22 - 32 mmol/L 27 24 22   Calcium 8.9 - 10.3 mg/dL 8.0(L) 7.9(L) 8.1(L)    ABG    Component Value Date/Time   PHART 7.395 12/17/2018 0528   PCO2ART 32.8 12/17/2018 0528   PO2ART 107.0 12/17/2018 0528   HCO3 20.0 12/17/2018 0528   TCO2 21 (L) 12/17/2018 0528   ACIDBASEDEF 4.0 (H) 12/17/2018 0528   O2SAT 98.0 12/17/2018 0528    POD 3 s/p AVR ascending Doing well.   Melodie Bouillon, MD 12/19/2018 4:22 PM

## 2018-12-20 LAB — GLUCOSE, CAPILLARY
Glucose-Capillary: 105 mg/dL — ABNORMAL HIGH (ref 70–99)
Glucose-Capillary: 118 mg/dL — ABNORMAL HIGH (ref 70–99)
Glucose-Capillary: 143 mg/dL — ABNORMAL HIGH (ref 70–99)
Glucose-Capillary: 90 mg/dL (ref 70–99)
Glucose-Capillary: 102 mg/dL — ABNORMAL HIGH (ref 70–99)

## 2018-12-20 LAB — BPAM RBC
Blood Product Expiration Date: 202009102359
Blood Product Expiration Date: 202009102359
ISSUE DATE / TIME: 202008150045
ISSUE DATE / TIME: 202008150229
Unit Type and Rh: 6200
Unit Type and Rh: 6200

## 2018-12-20 LAB — TYPE AND SCREEN
ABO/RH(D): A POS
Antibody Screen: NEGATIVE
Unit division: 0
Unit division: 0

## 2018-12-20 LAB — HEMOGLOBIN AND HEMATOCRIT, BLOOD
HCT: 27.9 % — ABNORMAL LOW (ref 39.0–52.0)
Hemoglobin: 9.2 g/dL — ABNORMAL LOW (ref 13.0–17.0)

## 2018-12-20 LAB — PREPARE RBC (CROSSMATCH)

## 2018-12-20 LAB — PROTIME-INR
INR: 1.1 (ref 0.8–1.2)
Prothrombin Time: 14.3 seconds (ref 11.4–15.2)

## 2018-12-20 MED ORDER — MAGNESIUM HYDROXIDE 400 MG/5ML PO SUSP: 30.0000 mL | Freq: Every day | ORAL | Status: DC | PRN

## 2018-12-20 MED ORDER — DOCUSATE SODIUM 100 MG PO CAPS
200.0000 mg | ORAL_CAPSULE | Freq: Every day | ORAL | Status: DC
  Administered 2018-12-24 – 2018-12-25 (×2): 200 mg via ORAL
  Filled 2018-12-20 (×3): qty 2

## 2018-12-20 MED ORDER — SODIUM CHLORIDE 0.9 % IV SOLN: 250.0000 mL | INTRAVENOUS | Status: DC | PRN

## 2018-12-20 MED ORDER — MOVING RIGHT ALONG BOOK
Freq: Once | Status: AC
  Administered 2018-12-21: 06:00:00
  Filled 2018-12-20 (×2): qty 1

## 2018-12-20 MED ORDER — POTASSIUM CHLORIDE CRYS ER 20 MEQ PO TBCR
20.0000 meq | EXTENDED_RELEASE_TABLET | ORAL | Status: AC
  Administered 2018-12-20 (×3): 20 meq via ORAL
  Filled 2018-12-20 (×3): qty 1

## 2018-12-20 MED ORDER — SODIUM CHLORIDE 0.9% IV SOLUTION
Freq: Once | INTRAVENOUS | Status: AC
  Administered 2018-12-20: 15:00:00 via INTRAVENOUS

## 2018-12-20 MED ORDER — SODIUM CHLORIDE 0.9% FLUSH: 3.0000 mL | INTRAVENOUS | Status: DC | PRN

## 2018-12-20 MED ORDER — PANTOPRAZOLE SODIUM 40 MG PO TBEC
40.0000 mg | DELAYED_RELEASE_TABLET | Freq: Every day | ORAL | Status: DC
  Administered 2018-12-21 – 2018-12-25 (×5): 40 mg via ORAL
  Filled 2018-12-20 (×5): qty 1

## 2018-12-20 MED ORDER — BISACODYL 5 MG PO TBEC: 10.0000 mg | DELAYED_RELEASE_TABLET | Freq: Every day | ORAL | Status: DC | PRN

## 2018-12-20 MED ORDER — ONDANSETRON HCL 4 MG/2ML IJ SOLN: 4.0000 mg | Freq: Four times a day (QID) | INTRAMUSCULAR | Status: DC | PRN

## 2018-12-20 MED ORDER — TRAMADOL HCL 50 MG PO TABS: 50.0000 mg | ORAL_TABLET | ORAL | Status: DC | PRN

## 2018-12-20 MED ORDER — SODIUM CHLORIDE 0.9% FLUSH
3.0000 mL | Freq: Two times a day (BID) | INTRAVENOUS | Status: DC
  Administered 2018-12-20 – 2018-12-25 (×10): 3 mL via INTRAVENOUS

## 2018-12-20 MED ORDER — ATORVASTATIN CALCIUM 40 MG PO TABS
40.0000 mg | ORAL_TABLET | Freq: Every day | ORAL | Status: DC
  Administered 2018-12-20 – 2018-12-25 (×6): 40 mg via ORAL
  Filled 2018-12-20 (×6): qty 1

## 2018-12-20 MED ORDER — PHENYLEPHRINE HCL-NACL 10-0.9 MG/250ML-% IV SOLN
INTRAVENOUS | Status: AC
  Filled 2018-12-20: qty 250

## 2018-12-20 MED ORDER — ONDANSETRON HCL 4 MG PO TABS: 4.0000 mg | ORAL_TABLET | Freq: Four times a day (QID) | ORAL | Status: DC | PRN

## 2018-12-20 MED ORDER — OXYCODONE HCL 5 MG PO TABS: 5.0000 mg | ORAL_TABLET | ORAL | Status: DC | PRN

## 2018-12-20 MED ORDER — ACETAMINOPHEN 325 MG PO TABS: 650.0000 mg | ORAL_TABLET | Freq: Four times a day (QID) | ORAL | Status: DC | PRN

## 2018-12-20 MED ORDER — BISACODYL 10 MG RE SUPP: 10.0000 mg | Freq: Every day | RECTAL | Status: DC | PRN

## 2018-12-20 NOTE — Progress Notes (Signed)
TCTS DAILY ICU PROGRESS NOTE                   [address]            [place] [id number]          [phone]   4 Days Post-Op Procedure(s) (LRB): AORTIC VALVE REPLACEMENT (AVR) (N/A) THORACIC ASCENDING ANEURYSM REPAIR (N/A) MAZE (N/A) TRANSESOPHAGEAL ECHOCARDIOGRAM (TEE) (N/A) CLIPPING OF ATRIAL APPENDAGE (Left)  Total Length of Stay:  LOS: 4 days   Subjective: Weak and dizzy at times  Objective: Vital signs in last 24 hours: Temp:  [97.6 F (36.4 C)-98.7 F (37.1 C)] 98.3 F (36.8 C) (08/18 0400) Pulse Rate:  [65-114] 98 (08/18 0700) Cardiac Rhythm: Normal sinus rhythm (08/18 0400) Resp:  [10-27] 19 (08/18 0700) BP: (95-139)/(63-82) 134/82 (08/18 0700) SpO2:  [95 %-100 %] 99 % (08/18 0700) Weight:  [81.4 kg] 81.4 kg (08/18 0448)  Filed Weights   12/18/18 0500 12/19/18 0600 12/20/18 0448  Weight: 85.4 kg 83 kg 81.4 kg    Weight change: -1.6 kg   Hemodynamic parameters for last 24 hours:    Intake/Output from previous day: 08/17 0701 - 08/18 0700 In: 516.6 [P.O.:490; I.V.:26.6] Out: 3561 [Urine:3330; Stool:1; Chest Tube:230]  Intake/Output this shift: No intake/output data recorded.  Current Meds: Scheduled Meds: . acetaminophen  1,000 mg Oral Q6H  . amiodarone  400 mg Oral BID  . aspirin EC  81 mg Oral Daily  . bisacodyl  10 mg Oral Daily  . Chlorhexidine Gluconate Cloth  6 each Topical Daily  . docusate sodium  200 mg Oral Daily  . ferrous fumarate-b12-vitamic C-folic acid  1 capsule Oral TID PC  . furosemide  40 mg Intravenous BID  . insulin aspart  0-24 Units Subcutaneous Q4H  . mouth rinse  15 mL Mouth Rinse BID  . metoprolol tartrate  25 mg Oral BID  . pantoprazole  40 mg Oral Daily  . sodium chloride flush  10-40 mL Intracatheter Q12H  . sodium chloride flush  3 mL Intravenous Q12H  . Warfarin - Pharmacist Dosing Inpatient   Does not apply q1800   Continuous Infusions: . lactated ringers Stopped (12/19/18 0811)   PRN  Meds:.chlorproMAZINE, metoprolol tartrate, ondansetron (ZOFRAN) IV, oxyCODONE, simethicone, sodium chloride flush, sodium chloride flush, traMADol  General appearance: alert, cooperative and no distress Heart: regular rate and rhythm Lungs: clear to auscultation bilaterally Abdomen: benign Extremities: no edema Wound: incis healing well  Lab Results: CBC: Recent Labs    12/18/18 0343 12/19/18 0351  WBC 12.5* 11.7*  HGB 7.2* 7.4*  HCT 21.7* 22.3*  PLT 97* 115*   BMET:  Recent Labs    12/18/18 0343 12/19/18 0351  NA 137 136  K 4.0 3.5  CL 106 100  CO2 24 27  GLUCOSE 125* 121*  BUN 29* 29*  CREATININE 1.83* 1.96*  CALCIUM 7.9* 8.0*    CMET: Lab Results  Component Value Date   WBC 11.7 (H) 12/19/2018   HGB 7.4 (L) 12/19/2018   HCT 22.3 (L) 12/19/2018   PLT 115 (L) 12/19/2018   GLUCOSE 121 (H) 12/19/2018   ALT 32 12/12/2018   AST 20 12/12/2018   NA 136 12/19/2018   K 3.5 12/19/2018   CL 100 12/19/2018   CREATININE 1.96 (H) 12/19/2018   BUN 29 (H) 12/19/2018   CO2 27 12/19/2018   INR 1.1 12/20/2018   HGBA1C 5.9 (H) 12/12/2018      PT/INR:  Recent Labs    12/20/18 0306  LABPROT 14.3  INR 1.1   Radiology: No results found.   Assessment/Plan: S/P Procedure(s) (LRB): AORTIC VALVE REPLACEMENT (AVR) (N/A) THORACIC ASCENDING ANEURYSM REPAIR (N/A) MAZE (N/A) TRANSESOPHAGEAL ECHOCARDIOGRAM (TEE) (N/A) CLIPPING OF ATRIAL APPENDAGE (Left)  1 overall doing well 2 hemodyn stable with some sinus tachy, may be related to symptomatic anemia- will transfuse 1 unit PRBC's, H/H 7.4/22 3 sats good on RA 4 D/C chest tubes today 5 renal fxn a little worse, may be getting dry, excellent UOP, will stop furosemide for now 6 BS control is good 7 leukocytosis trend improving- no fevers  John Giovanni [facility] 12/20/2018 7:47 AM  Pager [phone]

## 2018-12-20 NOTE — Progress Notes (Signed)
Patient transferred from [facility] and oriented via wheelchair to 4E 12,placed on bed,CHG given,connected to cardiac monitoring CCMD notified,oriented to room and staff,VS checked ,call bell with the patient,will continue to monitor.

## 2018-12-20 NOTE — Progress Notes (Addendum)
Report given to 4-East RN- All personal belongings sent with Pt (backpack, personal items, cell phone/ phone charger).

## 2018-12-20 NOTE — Progress Notes (Signed)
Patient ID: Joe Klein, male   DOB: 1961/04/29, 58 y.o.   MRN: [id number] TCTS Evening Rounds:  Hemodynamically stable  Ambulated. Waiting on bed to transfer to 4E.

## 2018-12-21 ENCOUNTER — Inpatient Hospital Stay (HOSPITAL_COMMUNITY): Payer: Medicaid Other

## 2018-12-21 LAB — CBC
HCT: 25.1 % — ABNORMAL LOW (ref 39.0–52.0)
Hemoglobin: 8.3 g/dL — ABNORMAL LOW (ref 13.0–17.0)
MCH: 28.4 pg (ref 26.0–34.0)
MCHC: 33.1 g/dL (ref 30.0–36.0)
MCV: 86 fL (ref 80.0–100.0)
Platelets: 189 10*3/uL (ref 150–400)
RBC: 2.92 MIL/uL — ABNORMAL LOW (ref 4.22–5.81)
RDW: 14.6 % (ref 11.5–15.5)
WBC: 9.8 10*3/uL (ref 4.0–10.5)
nRBC: 0 % (ref 0.0–0.2)

## 2018-12-21 LAB — TYPE AND SCREEN
ABO/RH(D): A POS
Antibody Screen: NEGATIVE
Unit division: 0

## 2018-12-21 LAB — BPAM RBC
Blood Product Expiration Date: 202009082359
ISSUE DATE / TIME: 202008181059
Unit Type and Rh: 6200

## 2018-12-21 LAB — PROTIME-INR
INR: 1.1 (ref 0.8–1.2)
Prothrombin Time: 14 seconds (ref 11.4–15.2)

## 2018-12-21 LAB — BASIC METABOLIC PANEL
Anion gap: 9 (ref 5–15)
BUN: 36 mg/dL — ABNORMAL HIGH (ref 6–20)
CO2: 26 mmol/L (ref 22–32)
Calcium: 8.5 mg/dL — ABNORMAL LOW (ref 8.9–10.3)
Chloride: 103 mmol/L (ref 98–111)
Creatinine, Ser: 1.9 mg/dL — ABNORMAL HIGH (ref 0.61–1.24)
GFR calc Af Amer: 44 mL/min — ABNORMAL LOW (ref >60)
GFR calc non Af Amer: 38 mL/min — ABNORMAL LOW (ref >60)
Glucose, Bld: 103 mg/dL — ABNORMAL HIGH (ref 70–99)
Potassium: 4 mmol/L (ref 3.5–5.1)
Sodium: 138 mmol/L (ref 135–145)

## 2018-12-21 MED ORDER — CLOPIDOGREL BISULFATE 75 MG PO TABS
75.0000 mg | ORAL_TABLET | Freq: Every day | ORAL | Status: DC
  Administered 2018-12-21 – 2018-12-25 (×5): 75 mg via ORAL
  Filled 2018-12-21 (×5): qty 1

## 2018-12-21 MED ORDER — LISINOPRIL 10 MG PO TABS
20.0000 mg | ORAL_TABLET | Freq: Every day | ORAL | Status: DC
  Administered 2018-12-21 – 2018-12-25 (×5): 20 mg via ORAL
  Filled 2018-12-21 (×8): qty 2

## 2018-12-21 MED ORDER — WARFARIN SODIUM 3 MG PO TABS
3.0000 mg | ORAL_TABLET | Freq: Once | ORAL | Status: AC
  Administered 2018-12-21: 3 mg via ORAL
  Filled 2018-12-21: qty 1

## 2018-12-21 MED ORDER — FUROSEMIDE 40 MG PO TABS
40.0000 mg | ORAL_TABLET | Freq: Every day | ORAL | Status: DC
  Administered 2018-12-21 – 2018-12-25 (×5): 40 mg via ORAL
  Filled 2018-12-21 (×5): qty 1

## 2018-12-21 MED ORDER — PATIENT'S GUIDE TO USING COUMADIN BOOK
Freq: Once | Status: AC
  Administered 2018-12-21: 18:00:00
  Filled 2018-12-21: qty 1

## 2018-12-21 NOTE — Progress Notes (Signed)
EPW removed per order. Tips intact. VSS. Pt instructed on 1 hour bedrest. Call light in reach.  Clyde Canterbury, RN

## 2018-12-21 NOTE — Progress Notes (Addendum)
[address]       [place] [address]             [phone]      5 Days Post-Op Procedure(s) (LRB): AORTIC VALVE REPLACEMENT (AVR) (N/A) THORACIC ASCENDING ANEURYSM REPAIR (N/A) MAZE (N/A) TRANSESOPHAGEAL ECHOCARDIOGRAM (TEE) (N/A) CLIPPING OF ATRIAL APPENDAGE (Left) Subjective: Feels well, no new c/o  Objective: Vital signs in last 24 hours: Temp:  [97.6 F (36.4 C)-98.4 F (36.9 C)] 97.7 F (36.5 C) (08/19 0430) Pulse Rate:  [54-130] 54 (08/19 0430) Cardiac Rhythm: Normal sinus rhythm (08/19 0701) Resp:  [15-29] 18 (08/19 0430) BP: (115-178)/(59-92) 115/65 (08/19 0430) SpO2:  [95 %-100 %] 100 % (08/19 0430) Weight:  [80.6 kg] 80.6 kg (08/19 0430)  Hemodynamic parameters for last 24 hours:    Intake/Output from previous day: 08/18 0701 - 08/19 0700 In: 443.3 [Blood:443.3] Out: 135 [Chest Tube:135] Intake/Output this shift: No intake/output data recorded.  General appearance: alert, cooperative and no distress Heart: regular rate and rhythm Lungs: clear to auscultation bilaterally Abdomen: benign Extremities: no edema Wound: incis healing well  Lab Results: Recent Labs    12/19/18 0351 12/20/18 1422 12/21/18 0244  WBC 11.7*  --  9.8  HGB 7.4* 9.2* 8.3*  HCT 22.3* 27.9* 25.1*  PLT 115*  --  189   BMET:  Recent Labs    12/19/18 0351 12/21/18 0244  NA 136 138  K 3.5 4.0  CL 100 103  CO2 27 26  GLUCOSE 121* 103*  BUN 29* 36*  CREATININE 1.96* 1.90*  CALCIUM 8.0* 8.5*    PT/INR:  Recent Labs    12/21/18 0244  LABPROT 14.0  INR 1.1   ABG    Component Value Date/Time   PHART 7.395 12/17/2018 0528   HCO3 20.0 12/17/2018 0528   TCO2 21 (L) 12/17/2018 0528   ACIDBASEDEF 4.0 (H) 12/17/2018 0528   O2SAT 98.0 12/17/2018 0528   CBG (last 3)  Recent Labs    12/20/18 0822 12/20/18 1110 12/20/18 1550  GLUCAP 143* 90 102*    Meds Scheduled Meds: . acetaminophen  1,000 mg Oral Q6H  . amiodarone  400 mg Oral BID   . aspirin EC  81 mg Oral Daily  . atorvastatin  40 mg Oral Daily  . docusate sodium  200 mg Oral Daily  . ferrous [id number] C-folic acid  1 capsule Oral TID PC  . mouth rinse  15 mL Mouth Rinse BID  . metoprolol tartrate  25 mg Oral BID  . pantoprazole  40 mg Oral QAC breakfast  . sodium chloride flush  3 mL Intravenous Q12H  . Warfarin - Pharmacist Dosing Inpatient   Does not apply q1800   Continuous Infusions: . sodium chloride     PRN Meds:.sodium chloride, acetaminophen, bisacodyl **OR** bisacodyl, magnesium hydroxide, metoprolol tartrate, ondansetron **OR** ondansetron (ZOFRAN) IV, oxyCODONE, sodium chloride flush, traMADol  Xrays Dg Chest 2 View  Result Date: 12/21/2018 CLINICAL DATA:  Reason for exam: surgery follow-up examination Hx of Aortic Valve replacement 12/16/2018 Hx of HTN and CHF EXAM: CHEST - 2 VIEW COMPARISON:  Chest radiograph 12/19/2018, 12/18/2018 FINDINGS: Stable cardiomediastinal contours with enlarged heart size status post median sternotomy and valve replacement. Interval removal of a right IJ sheath. Decreased opacities in the left lung base likely reflecting atelectasis. The right lung is clear. Persistent tiny right apical pneumothorax. Trace left pleural fluid. Upper abdomen is unremarkable. Degenerative changes are seen in the thoracic spine. IMPRESSION: 1.  Persistent tiny right apical pneumothorax. 2. Decreased left basilar opacities likely reflecting atelectasis. Trace left pleural fluid. Electronically Signed   By: Emmaline KluverNancy  Ballantyne M.D.   On: 12/21/2018 08:07    Assessment/Plan: S/P Procedure(s) (LRB): AORTIC VALVE REPLACEMENT (AVR) (N/A) THORACIC ASCENDING ANEURYSM REPAIR (N/A) MAZE (N/A) TRANSESOPHAGEAL ECHOCARDIOGRAM (TEE) (N/A) CLIPPING OF ATRIAL APPENDAGE (Left)  1 conts to do well 2 hemodyn stable in sinus rhythm, some PAC's, no afib 3 sats good on RA, small stable right apical pntx on CXR 4 BUN /creat fairly stable, lasix stopped-  appears euvolemic 5 H/H improved after transfusion- feels better 6 d/c epw's 7 INR only 1.1- not sure he got coumadin yesterday- pharmacy dosing 8 poss home 1-2 days, cont routime pulm toilet and rehab   LOS: 5 days    Rowe ClackWayne E [place] [facility] 12/21/2018 Pager [phone]  Pt seen and examined; chart reviewed. Will add Plavix now while INR still going up.  Restart diuresis.   Brantley FlingB. Zane Grayland Daisey, MD

## 2018-12-21 NOTE — Progress Notes (Signed)
ANTICOAGULATION CONSULT NOTE - Follow Up Consult  Pharmacy Consult for Warfarin Indication: mechanical mitral valve - new 8/14  No Known Allergies  Patient Measurements: Height: 6' (182.9 cm) Weight: 177 lb 9.6 oz (80.6 kg) IBW/kg (Calculated) : 77.6  Vital Signs: Temp: 98.6 F (37 C) (08/19 1211) Temp Source: Oral (08/19 1211) BP: 188/78 (08/19 1237) Pulse Rate: 76 (08/19 0843)  Labs: Recent Labs    12/19/18 0351 12/20/18 0306 12/20/18 1422 12/21/18 0244  HGB 7.4*  --  9.2* 8.3*  HCT 22.3*  --  27.9* 25.1*  PLT 115*  --   --  189  LABPROT  --  14.3  --  14.0  INR  --  1.1  --  1.1  CREATININE 1.96*  --   --  1.90*    Estimated Creatinine Clearance: 45.9 mL/min (A) (by C-G formula based on SCr of 1.9 mg/dL (H)).  Assessment:  58yom s/p on-X mechanical AVR/ thoracic aneurysm repair 8/14. Pharmacy consulted to dose warfarin.      Warfarin 2 mg give on 8/16 and 8/17;  Warfarin dosing missed on 8/18.  INR 1.1 today.   Post-op thrombocytopenia resolved. Hgb low, Trinsicon TID added 8/17.  On ASA 81 mg, amiodarone 400 mg BID.  Plavix added today.  Goal of Therapy:  INR 2-3 for the first 3 months, then 1.5-2.0 Monitor platelets by anticoagulation protocol: Yes   Plan:   Warfarin 3 mg x 1 today.  Conservative dosing with concurrent amiodarone.  Daily PT/INR.  Arty Baumgartner. [place] Pager: [phone] or phone: [phone] 12/21/2018,2:55 PM

## 2018-12-21 NOTE — Discharge Summary (Addendum)
Physician Discharge Summary  Patient ID: Amarii Bordas MRN: [id number] DOB/AGE: 59/11/1960 59 y.o.  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 Vickey Sages in cardiothoracic surgical consultation for evaluation of a sending aortic aneurysm and aortic stenosis with bicuspid aortic valve.  He underwent complete cardiology evaluation including echocardiogram and left and right heart catheterization.  The studies were evaluated by Dr. Vickey Sages who felt the patient was a suitable candidate to proceed with aortic valve replacement and repair of the aneurysm.  He was admitted this hospitalization electively for the procedure.See cath and echo report listed below.   Result status: Final result     ECHOCARDIOGRAM REPORT       Patient Name:   ESAU FRIDMAN Date of Exam: 11/02/2018 Medical Rec #:  [id number]          Height:       72.0 in Accession #:    [id number]         Weight:       182.8 lb Date of Birth:  1960/09/28           BSA:          2.05 m Patient Age:    58 years           BP:           142/82 mmHg Patient Gender: M                  HR:           49 bpm. Exam Location:  [facility]    Procedure:  2D Echo  Indications:    CHF   History:        Patient has no prior history of Echocardiogram examinations. CHF                 Aortic Valve Disease Risk Factors: Hypertension.   Sonographer:    Sinda Du RDCS (AE) Referring Phys: [id number] BRIAN J MUNLEY  IMPRESSIONS    1. The left ventricle has normal systolic function with an ejection fraction of 60-65%. The cavity size was normal. There is severe concentric left ventricular hypertrophy. Left ventricular diastolic Doppler parameters are consistent with restrictive  filling. Elevated left atrial and left ventricular end-diastolic pressures.  2. The right ventricle has normal systolic function. The cavity was normal. There is no increase in right ventricular wall thickness.  3. Left atrial size was moderately dilated.  4. No evidence of mitral valve stenosis.  5. The aortic valveis bicuspid. Moderate calcification of the aortic valve. Aortic valve regurgitation is mild to moderate by color flow Doppler. The jet is eccentric posteriorly directed. Moderate-severe stenosis of the aortic valve.  6. There is severe dilatation of the ascending aorta measuring 45  mm.  FINDINGS  Left Ventricle: The left ventricle has normal systolic function, with an ejection fraction of 60-65%. The cavity size was normal. There is severe concentric left ventricular hypertrophy. Left ventricular diastolic Doppler parameters are consistent with  restrictive filling. Elevated left atrial and left ventricular end-diastolic pressures  LV Wall Scoring: All segments are normal.   Right Ventricle: The right ventricle has normal systolic function. The cavity was normal. There is no increase in right ventricular wall thickness.  Left Atrium: Left atrial size was moderately dilated.  Right Atrium: Right atrial size was normal in size. Right atrial pressure is estimated at 3 mmHg.  Interatrial Septum: No atrial level shunt detected by color flow  Doppler.  Pericardium: There is no evidence of pericardial effusion.  Mitral Valve: The mitral valve is normal in structure. Mitral valve regurgitation is not visualized by color flow Doppler. No evidence of mitral valve stenosis. Pulmonary venous flow is blunted (decreased).  Tricuspid Valve: The tricuspid valve is normal in structure. Tricuspid valve regurgitation was not visualized by color flow Doppler.  Aortic Valve: The aortic valveis bicuspid Moderate calcification of the aortic valve, with severely decreased cusp excursion. Aortic valve regurgitation is mild to moderate by color flow Doppler. The jet is eccentric posteriorly directed. There is  Moderate-severe stenosis of the aortic valve, with a calculated valve area of 0.99 cm.  Pulmonic Valve: The pulmonic valve was normal in structure. Pulmonic valve regurgitation is not visualized by color flow Doppler. No evidence of pulmonic stenosis.  Aorta: There is severe dilatation of the ascending aorta measuring 45 mm.  Venous: The inferior vena cava measures 1.21 cm, is normal in size with greater than 50% respiratory variability.    +--------------+--------++  LEFT VENTRICLE            +----------------+----------++ +--------------+--------++  Diastology                     PLAX 2D                   +----------------+----------++ +--------------+--------++  LV e' lateral:   13.90 cm/s    LVIDd:         4.84 cm    +----------------+----------++ +--------------+--------++  LV E/e' lateral: 6.1           LVIDs:         2.99 cm    +----------------+----------++ +--------------+--------++  LV e' medial:    5.63 cm/s     LV PW:         1.85 cm    +----------------+----------++ +--------------+--------++  LV E/e' medial:  15.0          LV IVS:        1.85 cm    +----------------+----------++ +--------------+--------++  LVOT diam:     1.90 cm    +--------------+--------++  LV SV:         75 ml       +--------------+--------++  LV SV Index:   36.39      +--------------+--------++  LVOT Area:     2.84 cm   +--------------+--------++                            +--------------+--------++  +---------------+---------++  RIGHT VENTRICLE             +---------------+---------++  RV S prime:     8.83 cm/s   +---------------+---------++  TAPSE (M-mode): 1.9 cm      +---------------+---------++  +---------------+-------++-----------++  LEFT ATRIUM              Index         +---------------+-------++-----------++  LA diam:        4.20 cm  2.05 cm/m    +---------------+-------++-----------++  LA Vol (A2C):   86.3 ml  42.08 ml/m   +---------------+-------++-----------++  LA Vol (A4C):   76.9 ml  37.50 ml/m   +---------------+-------++-----------++  LA Biplane Vol: 85.4 ml  41.64 ml/m   +---------------+-------++-----------++ +------------+---------++-----------++  RIGHT ATRIUM            Index         +------------+---------++-----------++  RA Area:     17.20 cm                +------------+---------++-----------++  RA Volume:   50.80 ml   24.77 ml/m   +------------+---------++-----------++  +------------------+------------++  AORTIC VALVE                      +------------------+------------++  AV Area (Vmax):    1.13 cm       +------------------+------------++  AV Area (Vmean):   0.99 cm       +------------------+------------++  AV Area (VTI):     0.99 cm       +------------------+------------++  AV Vmax:           394.00 cm/s    +------------------+------------++  AV Vmean:          275.000 cm/s   +------------------+------------++  AV VTI:            0.968 m        +------------------+------------++  AV Peak Grad:      62.1 mmHg      +------------------+------------++  AV Mean Grad:      34.4 mmHg      +------------------+------------++  LVOT Vmax:         157.00 cm/s    +------------------+------------++  LVOT Vmean:        95.900 cm/s     +------------------+------------++  LVOT VTI:          0.338 m        +------------------+------------++  LVOT/AV VTI ratio: 0.35           +------------------+------------++  AR PHT:            659 msec       +------------------+------------++   +-------------+-------++  AORTA                   +-------------+-------++  Ao Root diam: 3.10 cm   +-------------+-------++  Ao Asc diam:  4.50 cm   +-------------+-------++  +--------------+----------++ +---------------+-----------++  MITRAL VALVE                 TRICUSPID VALVE               +--------------+----------++ +---------------+-----------++  MV Area (PHT): 2.54 cm      TR Peak grad:   16.2 mmHg     +--------------+----------++ +---------------+-----------++  MV PHT:        86.71 msec    TR Vmax:        201.00 cm/s   +--------------+----------++ +---------------+-----------++  MV Decel Time: 299 msec     +--------------+----------++ +--------------+-------+ +--------------+----------++  SHUNTS                   MV E velocity: 84.40 cm/s   +--------------+-------+ +--------------+----------++  Systemic VTI:  0.34 m  MV A velocity: 41.20 cm/s   +--------------+-------+ +--------------+----------++  Systemic Diam: 1.90 cm   MV E/A ratio:  2.05         +--------------+-------+ +--------------+----------++  +---------+-------+  IVC                +---------+-------+  IVC diam: 1.21 cm  +---------+-------+    Norman Herrlich MD Electronically signed by Norman Herrlich MD Signature Date/Time: 11/02/2018/5:22:55 PM   RIGHT/LEFT HEART CATH AND CORONARY ANGIOGRAPHY  Conclusion    LV end diastolic pressure is normal.  There is mild aortic stenosis by gradient.  Mild, diffuse nonobstructive CAD.  Ao sat 99%, PA sat 78%, CO 6.9 L/min; CI 3.3, mean PA pressure 18 mm Hg; mean PCWP 11 mm Hg  Transient atrial fibrillation noted during the cath, which affected obtaining waveform and gradient during pullback from LV to  Ao. Marland Kitchen   Plan for surgical evaluation of aneurysm and aortic valve.    Continue preventive therapy.    Recommendations  Discharge Date In the absence of any other complications or medical issues, we expect the patient to be ready for discharge from a cath perspective on 11/18/2018.  Indications  Nonrheumatic aortic valve stenosis [I35.0 (ICD-10-CM)]  Thoracic aortic aneurysm without rupture (HCC) [I71.2 (ICD-10-CM)]  Procedural Details  Technical Details The risks, benefits, and details of the procedure were explained to the patient.  The patient verbalized understanding and wanted to proceed.  Informed written consent was obtained.  PROCEDURE TECHNIQUE:  After Xylocaine anesthesia, a 5 French sheath was placed in the right antecubital area in exchange for a peripheral IV. A 5 French balloontipped Swan-Ganz catheter was advanced to the pulmonary artery under fluoroscopic guidance. Hemodynamic pressures were obtained. Oxygen saturations were obtained. After Xylocaine anesthesia, a 58F sheath was placed in the right femoral artery with a single anterior needle wall stick.   Left coronary angiography was done using a Judkins L4 guide catheter.  Right coronary angiography was done using a Judkins R4 guide catheter.  Left heart cath was done using an AL1 catheter.      Contrast: 40 cc Estimated blood loss <50 mL.   During this procedure medications were administered to achieve and maintain moderate conscious sedation while the patient's heart rate, blood pressure, and oxygen saturation were continuously monitored and I was present face-to-face 100% of this time.  Medications (Filter: Administrations occurring from 11/18/18 1021 to 11/18/18 1133) (important)  Continuous medications are totaled by the amount administered until 11/18/18 1133.  Medication Rate/Dose/Volume Action  Date Time   fentaNYL (SUBLIMAZE) injection (mcg) 25 mcg Given 11/18/18 1047   Total dose as of 11/18/18 1133         25 mcg        midazolam (VERSED) injection (mg) 2 mg Given 11/18/18 1047   Total dose as of 11/18/18 1133        2 mg        lidocaine (PF) (XYLOCAINE) 1 % injection (mL) 1 mL Given 11/18/18 1051   Total dose as of 11/18/18 1133 2 mL Given 1052   3 mL        Heparin (Porcine) in NaCl 1000-0.9 UT/500ML-% SOLN (mL) 500 mL Given 11/18/18 1052   Total dose as of 11/18/18 1133 500 mL Given 1052   1,000 mL        Radial Cocktail/Verapamil only (mL) 10 mL Given 11/18/18 1053   Total dose as of 11/18/18 1133        10  mL        metoprolol tartrate (LOPRESSOR) injection (mg) 5 mg Given 11/18/18 1101   Total dose as of 11/18/18 1133 5 mg Given 1110   15 mg 5 mg Given 1119   heparin injection (Units) 4,000 Units Given 11/18/18 1102   Total dose as of 11/18/18 1133        4,000 Units        iohexol (OMNIPAQUE) 350 MG/ML injection (mL) 40 mL Given 11/18/18 1109   Total dose as of 11/18/18 1133        40 mL        0.9 % sodium chloride infusion (mL/hr) 100 mL/hr Rate/Dose Change 11/18/18 1100   Dosing weight:  81.6 kg        Total dose as of 11/18/18 1133        55.64 mL        Sedation Time  Sedation Time Physician-1: 23 minutes 19 seconds  Coronary Findings  Diagnostic Dominance: Right Left Anterior Descending  The vessel exhibits minimal luminal irregularities.  Second Diagonal Branch  The vessel exhibits minimal luminal irregularities.  Left Circumflex  The vessel exhibits minimal luminal irregularities.  Right Coronary Artery  The vessel exhibits minimal luminal irregularities.  Intervention  No interventions have been documented. Right Heart  Right Heart Pressures Ao sat 99%, PA sat 78%, CO 6.9 L/min; CI 3.3, mean PA pressure 18 mm Hg; mean PCWP 11 mm Hg  Left Heart  Left Ventricle LV end diastolic pressure is normal.  Aortic Valve There is moderate aortic valve stenosis. The aortic valve is congenitally bicuspid. Crossed with AL1 and J-wire.  Coronary  Diagrams  Diagnostic Dominance: Right  Intervention    Discharged Condition: good  Hospital Course: The patient was admitted electively and on 12/17/2018 he was taken to the operating room where he underwent the below described procedure.  He tolerated it well and was taken to the surgical intensive care unit in stable condition.  Postoperative hospital course:  The patient has done well postoperatively.  He was extubated using standard protocols the evening of surgery.  On postoperative day #1 he was noted to have a clinically significant pneumothorax on the right side requiring placement of a chest tube by Dr. Vickey Sages.  This tube was removed following routine observation without difficulty.  Chest x-ray showed a clinically insignificant pneumothorax following that.  Patient did have postoperative atrial fibrillation and is currently rate controlled on beta-blocker and amiodarone.  He is on Coumadin for his mechanical valve as well as atrial fibrillation and as he gets therapeutic he was placed on Plavix and Lovenox.  Coumadin dosing was adjusted per the pharmacy.  He has shown excellent ongoing clinical progress.  Oxygen was weaned without difficulty and he maintains good oxygen saturations on room air.  Incisions are noted to be healing well without evidence of infection.  He is tolerating routine cardiac rehab postoperative modalities.  He is tolerating diet.  His volume status shows a good improvement.  He does have some renal insufficiency and most recent BUN and creatinine dated 12/24/2018 are 39/1.97 .  He did have some postoperative hypertension but his ACE inhibitor has been resumed and values have improved.  At the time of discharge the patient is felt to be quite stable. INR today was 1.7. Plan for follow-up tomorrow for INR at 2:30pm with coumadin clinic on [address]. Patient can then set up INR draws closer to home since he will be on it  lifelong. Goal INR for the first 3 months with an  ON-X valve is 2.0-3.0, then is 1.5-2.0 after the first three months. Information shares with the patient and he verbalized understanding.    Consults: None  Significant Diagnostic Studies: routine serial post-op labs and CXR's  Treatments: surgery:  CARDIOTHORACIC SURGERY OPERATIVE NOTE  Date of Procedure:    12/17/2018  Preoperative Diagnosis:      Bicuspid aortic valve disease with moderate stenosis, ascending aortic aneurysm, paroxysmal atrial fibrillation  Postoperative Diagnosis:    Same  Procedure:       Replacement of ascending aorta with super coronary graft implantation (28 mm Hemashield platinum dacryon) proximally and a distal hemi-arch reconstruction under hypothermic circulatory arrest (17 minutes with 15 minutes of antegrade cerebral perfusion), aortic valve replacement (23 mm On-X mechanical valve), bilateral pulmonary vein isolation and left atrial appendage clipping  Surgeon:        Orvan Seen, B. Zane MD  Assistant:       Jadene Pierini PA-C  Anesthesia:    GET  Operative Findings: ? Hypertrophied and dilated left ventricule ? Well-seated aortic prosthesis ?  Discharge Exam: Blood pressure (!) 144/87, pulse 73, temperature 98.6 F (37 C), temperature source Oral, resp. rate (!) 21, height 6' (1.829 m), weight 76.9 kg, SpO2 100 %.   General appearance: alert, cooperative and no distress Heart: regular rate and rhythm, S1, S2 normal, no murmur, click, rub or gallop Lungs: clear to auscultation bilaterally Abdomen: soft, non-tender; bowel sounds normal; no masses,  no organomegaly Extremities: extremities normal, atraumatic, no cyanosis or edema Wound: clean and dry  Disposition:   Discharge Instructions    Amb Referral to Cardiac Rehabilitation   Complete by: As directed    To [place]   Diagnosis: Valve Replacement   Valve: Aortic   After initial evaluation and assessments completed: Virtual Based Care may be provided alone or in conjunction with  Phase 2 Cardiac Rehab based on patient barriers.: Yes     Allergies as of 12/25/2018   No Known Allergies     Medication List    STOP taking these medications   Eliquis 5 MG Tabs tablet Generic drug: apixaban   hydrocortisone 25 MG suppository Commonly known as: ANUSOL-HC   labetalol 200 MG tablet Commonly known as: NORMODYNE     TAKE these medications   acetaminophen 325 MG tablet Commonly known as: TYLENOL Take 2 tablets (650 mg total) by mouth every 6 (six) hours as needed for mild pain.   amiodarone 200 MG tablet Commonly known as: Pacerone Take 1 tablet (200 mg total) by mouth 2 (two) times daily.   aspirin 81 MG EC tablet Take 81 mg by mouth daily.   atorvastatin 40 MG tablet Commonly known as: LIPITOR Take 40 mg by mouth daily.   furosemide 20 MG tablet Commonly known as: LASIX Take 20 mg by mouth daily.   lisinopril 20 MG tablet Commonly known as: ZESTRIL Take 20 mg by mouth daily.   metoprolol tartrate 50 MG tablet Commonly known as: LOPRESSOR Take 1 tablet (50 mg total) by mouth 2 (two) times daily.   oxyCODONE 5 MG immediate release tablet Commonly known as: Oxy IR/ROXICODONE Take 1 tablet (5 mg total) by mouth every 6 (six) hours as needed for severe pain.   potassium chloride 10 MEQ tablet Commonly known as: K-DUR Take 10 mEq by mouth daily.   warfarin 5 MG tablet Commonly known as: Coumadin Take 1 tablet (5 mg total) by mouth daily.  Follow-up Information    Linden DolinAtkins, Broadus Z, MD Follow up.   Specialty: Cardiothoracic Surgery Why: Please see discharge paperwork for follow-up appointment with Dr. Vickey SagesAtkins.  [facility] to this appointment.  It is located in the same office complex on the first floor. Contact information: [address] [place] [address] [phone]        Baldo DaubMunley, Brian J, MD Follow up.   Specialty: Cardiology Why: Sept 4th, 2020 at 3:35 pm to  see Dr Dulce SellarMunley. Contact information: [address] [place] [address] [phone]        [facility] [address] Office Follow up.   Specialty: Cardiology Why: coumadin clinic appt on Monday 8/24 at 2:30 pm Contact information: [address], [address] [place] [address] [phone]       Crist FatVan Eyk, Jason, MD. Call in 1 day(s).   Specialty: Internal Medicine Contact information: [address] [place] [address] [phone]          The patient has been discharged on:   1.Beta Blocker:  Yes [  y ]                              No   [   ]                              If No, reason:  2.Ace Inhibitor/ARB: Yes [ y  ]                                     No  [    ]                                     If No, reason:  3.Statin:   Yes [   ]                  No  [  n ]                  If No, reason:no hypercholesterolemia/CAD  4.Marlowe KaysEcasaValentino Hue:  Yes  [place]ienne[y   ]                  No   [   ]                  If No, reason:  Signed: Sharlene Doryessa N Hollister Wessler 12/25/2018, 10:37 AM

## 2018-12-21 NOTE — Progress Notes (Signed)
CARDIAC REHAB PHASE I   PRE:  Rate/Rhythm: 70 SR    BP: sitting 151/73    SaO2: 100 RA  MODE:  Ambulation: 790 ft   POST:  Rate/Rhythm: 87 SR    BP: sitting 154/82     SaO2: 99 RA  Tolerated very well. Steady, slow pace. Talked entire walk. VSS, to recliner. Encouraged more IS, doing 1250 mL. Marianne, ACSM 12/21/2018 2:37 PM

## 2018-12-22 LAB — BASIC METABOLIC PANEL
Anion gap: 9 (ref 5–15)
BUN: 33 mg/dL — ABNORMAL HIGH (ref 6–20)
CO2: 25 mmol/L (ref 22–32)
Calcium: 8.7 mg/dL — ABNORMAL LOW (ref 8.9–10.3)
Chloride: 104 mmol/L (ref 98–111)
Creatinine, Ser: 1.85 mg/dL — ABNORMAL HIGH (ref 0.61–1.24)
GFR calc Af Amer: 45 mL/min — ABNORMAL LOW (ref >60)
GFR calc non Af Amer: 39 mL/min — ABNORMAL LOW (ref >60)
Glucose, Bld: 101 mg/dL — ABNORMAL HIGH (ref 70–99)
Potassium: 4.3 mmol/L (ref 3.5–5.1)
Sodium: 138 mmol/L (ref 135–145)

## 2018-12-22 LAB — PROTIME-INR
INR: 1.1 (ref 0.8–1.2)
Prothrombin Time: 14 seconds (ref 11.4–15.2)

## 2018-12-22 MED ORDER — ENOXAPARIN SODIUM 40 MG/0.4ML ~~LOC~~ SOLN
40.0000 mg | SUBCUTANEOUS | Status: DC
  Administered 2018-12-22 – 2018-12-25 (×4): 40 mg via SUBCUTANEOUS
  Filled 2018-12-22 (×4): qty 0.4

## 2018-12-22 MED ORDER — METOPROLOL TARTRATE 50 MG PO TABS
50.0000 mg | ORAL_TABLET | Freq: Two times a day (BID) | ORAL | Status: DC
  Administered 2018-12-22 – 2018-12-25 (×7): 50 mg via ORAL
  Filled 2018-12-22 (×7): qty 1

## 2018-12-22 MED ORDER — WARFARIN SODIUM 5 MG PO TABS
5.0000 mg | ORAL_TABLET | Freq: Once | ORAL | Status: AC
  Administered 2018-12-22: 5 mg via ORAL
  Filled 2018-12-22: qty 1

## 2018-12-22 NOTE — Discharge Instructions (Signed)
Surgical Aortic Valve Replacement, Care After This sheet gives you information about how to care for yourself after your procedure. Your health care provider may also give you more specific instructions. If you have problems or questions, contact your health care provider. What can I expect after the procedure? After the procedure, it is common to have pain around your incision area. Follow these instructions at home: Medicines  Take over-the-counter and prescription medicines only as told by your health care provider.  If you were prescribed an antibiotic medicine, take it as told by your health care provider. Do not stop taking the antibiotic even if you start to feel better.  If you have a mechanical prosthesis, you may be given a blood thinner called warfarin. Follow instructions carefully on how to take this medicine.  Ask your health care provider if the medicine prescribed to you: ? Requires you to avoid driving or using heavy machinery. ? Can cause constipation. You may need to take actions to prevent or treat constipation, such as:  Take over-the-counter or prescription medicines.  Eat foods that are high in fiber, such as beans, whole grains, and fresh fruits and vegetables.  Limit foods that are high in fat and processed sugars, such as fried or sweet foods. Eating and drinking      Limit how much caffeine you drink. Caffeine can affect your heart's rate and rhythm.  Do not drink alcohol if: ? Your health care provider tells you not to drink. ? You are pregnant, may be pregnant, or are planning to become pregnant.  Drink enough fluid to keep your urine pale yellow.  Eat a heart-healthy diet that includes fruits, vegetables, whole grains, low-fat dairy products, and lean proteins like poultry and eggs. Incision care   Follow instructions from your health care provider about how to take care of your incision. Make sure you: ? Wash your hands with soap and water before  and after you change your bandage (dressing). If soap and water are not available, use hand sanitizer. ? Change your dressing as told by your health care provider. ? Leave stitches (sutures), skin glue, or adhesive strips in place. These skin closures may need to stay in place for 2 weeks or longer. If adhesive strip edges start to loosen and curl up, you may trim the loose edges. Do not remove adhesive strips completely unless your health care provider tells you to do that.  Check your incision area every day for signs of infection. Check for: ? Redness, swelling, or increasing pain. ? Fluid or blood. ? Warmth. ? Pus or a bad smell. Activity  Return to your normal activities as told by your health care provider. Most patients will need to limit any lifting or strenuous activity for 4-6 weeks.  Avoid sitting for a long time without moving. Get up to take short walks every 1-2 hours.  Do exercises as told by your health care provider.  Do not lift anything that is heavier than 10 lb (4.5 kg), or the limit that you are told, until your health care provider says that it is safe.  Avoid pushing or pulling things with your arms until your health care provider approves. This includes pulling on handrails to help you climb stairs. Lifestyle  Do not use any products that contain nicotine or tobacco, such as cigarettes, e-cigarettes, and chewing tobacco. These can delay incision healing after surgery. If you need help quitting, ask your health care provider.  Resume sexual activity as told  by your health care provider. If you have erectile dysfunction, do not use medicines to treat this condition unless your health care provider approves.  Work with your health care provider to: ? Keep your blood pressure and cholesterol under control. ? Manage any other heart conditions that you have. ? Maintain a healthy weight. Driving and travel  Do not drive until your health care provider approves. Ask  your health care provider when it is safe for you to drive.  Avoid airplane travel for as long as told by your health care provider.  When you travel, bring a list of your medicines and a record of your medical history with you. Carry your medicines with you. General instructions  Do not take baths, swim, or use a hot tub until your health care provider approves. You may take a shower.  Do not strain to have a bowel movement.  Avoid crossing your legs while sitting down.  Check your temperature every day for a fever. A fever may be a sign of infection.  If you are a woman and you plan to become pregnant, talk with your health care provider before you become pregnant.  Wear compression stockings as told by your health care provider. These stockings help to prevent blood clots and reduce swelling in your legs.  Tell all health care providers who care for you that you have an artificial (prosthetic) aortic valve. Also, tell them if you have or have had heart disease or endocarditis.  You will be given a card at discharge. The card indicates the type of prosthetic valve that you have. Keep this card in your wallet or purse for quick reference in case of an emergency.  Keep all follow-up visits as told by your health care provider. This is important. Contact a health care provider if:  You develop a skin rash.  You experience sudden, unexplained changes in your weight.  You have redness, swelling, or increasing pain around your incision.  You have fluid or blood coming from your incision.  Your incision feels warm to the touch.  You have pus or a bad smell coming from your incision.  You have a fever. Get help right away if you:  Develop chest pain that is different from the pain coming from your incision.  Develop shortness of breath or difficulty breathing.  Start to feel light-headed. These symptoms may represent a serious problem that is an emergency. Do not wait to see  if the symptoms will go away. Get medical help right away. Call your local emergency services (911 in the [place] Do not drive yourself to the hospital. Summary  After this procedure, it is common to have pain in the incision area.  Eat a heart-healthy diet. Follow instructions about alcohol use.  Get up to walk often. Avoid pushing and pulling with your arms. Ask what activities are safe for you.  Care for your incision as told by your health care provider. Check it daily for signs of infection.  Get help right away if you have chest pain that is different from your incisions, develop shortness of breath or difficulty breathing, or start to feel light-headed. This information is not intended to replace advice given to you by your health care provider. Make sure you discuss any questions you have with your health care provider. Document Released: 11/06/2004 Document Revised: 01/13/2018 Document Reviewed: 01/13/2018 Elsevier Patient Education  2020 [employer]  ==========================================================================================  Information on my medicine - Coumadin   (Warfarin)  This medication education was reviewed with me or my healthcare representative as part of my discharge preparation.   Why was Coumadin prescribed for you? Coumadin was prescribed for you because you have a blood clot or a medical condition that can cause an increased risk of forming blood clots. Blood clots can cause serious health problems by blocking the flow of blood to the heart, lung, or brain. Coumadin can prevent harmful blood clots from forming. As a reminder your indication for Coumadin is:   Blood Clot Prevention After Heart Valve Surgery  What test will check on my response to Coumadin? While on Coumadin (warfarin) you will need to have an INR test regularly to ensure that your dose is keeping you in the desired range. The INR (international normalized ratio) number is calculated  from the result of the laboratory test called prothrombin time (PT).  If an INR APPOINTMENT HAS NOT ALREADY BEEN MADE FOR YOU please schedule an appointment to have this lab work done by your health care provider within 7 days. Your INR goal is usually a number between:  2 to 3 or your provider may give you a more narrow range like 2-2.5. The goal will likely change to 1.5-2.0 after 3 months, with the type of valve that you have.  Ask your health care provider during an office visit what your goal INR is.  What  do you need to  know  About  COUMADIN? Take Coumadin (warfarin) exactly as prescribed by your healthcare provider about the same time each day.  DO NOT stop taking without talking to the doctor who prescribed the medication.  Stopping without other blood clot prevention medication to take the place of Coumadin may increase your risk of developing a new clot or stroke.  Get refills before you run out.  What do you do if you miss a dose? If you miss a dose, take it as soon as you remember on the same day then continue your regularly scheduled regimen the next day.  Do not take two doses of Coumadin at the same time.  Important Safety Information A possible side effect of Coumadin (Warfarin) is an increased risk of bleeding. You should call your healthcare provider right away if you experience any of the following: ? Bleeding from an injury or your nose that does not stop. ? Unusual colored urine (red or dark brown) or unusual colored stools (red or black). ? Unusual bruising for unknown reasons. ? A serious fall or if you hit your head (even if there is no bleeding).  Some foods or medicines interact with Coumadin (warfarin) and might alter your response to warfarin. To help avoid this: ? Eat a balanced diet, maintaining a consistent amount of Vitamin K. ? Notify your provider about major diet changes you plan to make. ? Avoid alcohol or limit your intake to 1 drink for women and 2 drinks  for men per day. (1 drink is 5 oz. wine, 12 oz. beer, or 1.5 oz. liquor.)  Make sure that ANY health care provider who prescribes medication for you knows that you are taking Coumadin (warfarin).  Also make sure the healthcare provider who is monitoring your Coumadin knows when you have started a new medication including herbals and non-prescription products.  Coumadin (Warfarin)  Major Drug Interactions  Increased Warfarin Effect Decreased Warfarin Effect  Alcohol (large quantities) Antibiotics (esp. Septra/Bactrim, Flagyl, Cipro) Amiodarone (Cordarone) Aspirin (ASA) Cimetidine (Tagamet) Megestrol (Megace) NSAIDs (ibuprofen, naproxen, etc.) Piroxicam (Feldene) Propafenone (Rythmol  SR) Propranolol (Inderal) Isoniazid (INH) Posaconazole (Noxafil) Barbiturates (Phenobarbital) Carbamazepine (Tegretol) Chlordiazepoxide (Librium) Cholestyramine (Questran) Griseofulvin Oral Contraceptives Rifampin Sucralfate (Carafate) Vitamin K   Coumadin (Warfarin) Major Herbal Interactions  Increased Warfarin Effect Decreased Warfarin Effect  Garlic Ginseng Ginkgo biloba Coenzyme Q10 Green tea St. Johns wort    Coumadin (Warfarin) FOOD Interactions  Eat a consistent number of servings per week of foods HIGH in Vitamin K (1 serving =  cup)  Collards (cooked, or boiled & drained) Kale (cooked, or boiled & drained) Mustard greens (cooked, or boiled & drained) Parsley *serving size only =  cup Spinach (cooked, or boiled & drained) Swiss chard (cooked, or boiled & drained) Turnip greens (cooked, or boiled & drained)  Eat a consistent number of servings per week of foods MEDIUM-HIGH in Vitamin K (1 serving = 1 cup)  Asparagus (cooked, or boiled & drained) Broccoli (cooked, boiled & drained, or raw & chopped) Brussel sprouts (cooked, or boiled & drained) *serving size only =  cup Lettuce, raw (green leaf, endive, romaine) Spinach, raw Turnip greens, raw & chopped   These websites  have more information on Coumadin (warfarin):  FailFactory.se; VeganReport.com.au;

## 2018-12-22 NOTE — Progress Notes (Signed)
[address]       [place] [address]             [phone]      6 Days Post-Op Procedure(s) (LRB): AORTIC VALVE REPLACEMENT (AVR) (N/A) THORACIC ASCENDING ANEURYSM REPAIR (N/A) MAZE (N/A) TRANSESOPHAGEAL ECHOCARDIOGRAM (TEE) (N/A) CLIPPING OF ATRIAL APPENDAGE (Left) Subjective: Now in afib, rapid to 120's at times  Objective: Vital signs in last 24 hours: Temp:  [98.2 F (36.8 C)-98.6 F (37 C)] 98.5 F (36.9 C) (08/20 0415) Pulse Rate:  [76-82] 77 (08/20 0415) Cardiac Rhythm: Normal sinus rhythm (08/19 1901) Resp:  [16-23] 16 (08/20 0415) BP: (137-194)/(67-85) 137/76 (08/20 0415) SpO2:  [98 %-100 %] 98 % (08/20 0415) Weight:  [79.4 kg] 79.4 kg (08/20 0415)  Hemodynamic parameters for last 24 hours:    Intake/Output from previous day: 08/19 0701 - 08/20 0700 In: 480 [P.O.:480] Out: -  Intake/Output this shift: No intake/output data recorded.  General appearance: alert, cooperative and no distress Heart: irregularly irregular rhythm and tachy Lungs: slightly dim in left base Abdomen: benign Extremities: no edema Wound: incis healing well  Lab Results: Recent Labs    12/20/18 1422 12/21/18 0244  WBC  --  9.8  HGB 9.2* 8.3*  HCT 27.9* 25.1*  PLT  --  189   BMET:  Recent Labs    12/21/18 0244 12/22/18 0319  NA 138 138  K 4.0 4.3  CL 103 104  CO2 26 25  GLUCOSE 103* 101*  BUN 36* 33*  CREATININE 1.90* 1.85*  CALCIUM 8.5* 8.7*    PT/INR:  Recent Labs    12/22/18 0319  LABPROT 14.0  INR 1.1   ABG    Component Value Date/Time   PHART 7.395 12/17/2018 0528   HCO3 20.0 12/17/2018 0528   TCO2 21 (L) 12/17/2018 0528   ACIDBASEDEF 4.0 (H) 12/17/2018 0528   O2SAT 98.0 12/17/2018 0528   CBG (last 3)  Recent Labs    12/20/18 0822 12/20/18 1110 12/20/18 1550  GLUCAP 143* 90 102*    Meds Scheduled Meds: . amiodarone  400 mg Oral BID  . aspirin EC  81 mg Oral Daily  . atorvastatin  40 mg Oral Daily  .  clopidogrel  75 mg Oral Daily  . docusate sodium  200 mg Oral Daily  . ferrous [id number] C-folic acid  1 capsule Oral TID PC  . furosemide  40 mg Oral Daily  . lisinopril  20 mg Oral Daily  . mouth rinse  15 mL Mouth Rinse BID  . metoprolol tartrate  25 mg Oral BID  . pantoprazole  40 mg Oral QAC breakfast  . sodium chloride flush  3 mL Intravenous Q12H  . Warfarin - Pharmacist Dosing Inpatient   Does not apply q1800   Continuous Infusions: . sodium chloride     PRN Meds:.sodium chloride, acetaminophen, bisacodyl **OR** bisacodyl, magnesium hydroxide, metoprolol tartrate, ondansetron **OR** ondansetron (ZOFRAN) IV, oxyCODONE, sodium chloride flush, traMADol  Xrays Dg Chest 2 View  Result Date: 12/21/2018 CLINICAL DATA:  Reason for exam: surgery follow-up examination Hx of Aortic Valve replacement 12/16/2018 Hx of HTN and CHF EXAM: CHEST - 2 VIEW COMPARISON:  Chest radiograph 12/19/2018, 12/18/2018 FINDINGS: Stable cardiomediastinal contours with enlarged heart size status post median sternotomy and valve replacement. Interval removal of a right IJ sheath. Decreased opacities in the left lung base likely reflecting atelectasis. The right lung is clear. Persistent tiny right apical pneumothorax. Trace left pleural fluid.  Upper abdomen is unremarkable. Degenerative changes are seen in the thoracic spine. IMPRESSION: 1. Persistent tiny right apical pneumothorax. 2. Decreased left basilar opacities likely reflecting atelectasis. Trace left pleural fluid. Electronically Signed   By: Emmaline KluverNancy  Ballantyne M.D.   On: 12/21/2018 08:07    Assessment/Plan: S/P Procedure(s) (LRB): AORTIC VALVE REPLACEMENT (AVR) (N/A) THORACIC ASCENDING ANEURYSM REPAIR (N/A) MAZE (N/A) TRANSESOPHAGEAL ECHOCARDIOGRAM (TEE) (N/A) CLIPPING OF ATRIAL APPENDAGE (Left)  1 doing well 2 afib with RVR, on amiodarone, will increase metoprolol 3 HTN, improving with lisinopril 4 cont coumadin load, pharmacy dosing 5  creat pretty stable, cont gentle diuresis 6 hold discharge for now, cont pulm toilet and routine rehab  LOS: 6 days    Rowe ClackWayne E Mirah Nevins PA-C 12/22/2018 Pager [phone]

## 2018-12-22 NOTE — Progress Notes (Signed)
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  Assessment:  58yom s/p on-X mechanical AVR/ thoracic aneurysm repair 8/14. Pharmacy consulted to dose warfarin.      Warfarin 2 mg given on 8/16 and 8/17;  Warfarin dosing missed on 8/18.  INR remains 1.1 after 3 mg x 1 yesterday. Still at baseline after initial conservative dosing,      On ASA 81 mg, amiodarone 400 mg BID, ASA 81 mg. Plavix added 8/19 and Lovenox 40 mg SQ q24hrs added today. Post-op thrombocytopenia resolved. Hgb low, Trinsicon TID added 8/17.   Goal of Therapy:  INR 2-3 for the first 3 months, then 1.5-2.0 Monitor platelets by anticoagulation protocol: Yes   Plan:   Increase Warfarin to 5 mg x 1 today.   Monitor for sensitivity with concurrent Amiodarone.  Lovenox 40 mg SQ q24hrs added today.  Daily PT/INR.  CBC in am.  Arty Baumgartner. [place] Pager: [phone] or phone: [phone] 12/22/2018,12:30 PM

## 2018-12-22 NOTE — Progress Notes (Signed)
During bedside report pt HR noticed to be in 110-120's. EKG obtained and showed a.fib. BP 136/84 (99). Pt asymptomatic. PA notified.  Clyde Canterbury, RN

## 2018-12-22 NOTE — Progress Notes (Signed)
CARDIAC REHAB PHASE I   PRE:  Rate/Rhythm: 90 afib     BP: sitting 114/74    SaO2: 97 RA  MODE:  Ambulation: 790 ft   POST:  Rate/Rhythm: 82 SR    BP: sitting 128/72     SaO2: 97 RA  Pt still in afib on my arrival but lower rate. However I checked his tele after 50 ft in hall and now in NSR. Rhythm maintained 82 NSR entire walk. Pt feels well. Return to recliner. Gave [employer] the [employer] book and videos to watch. Encouraged another walk. Will f/u tomorrow. [place], ACSM 12/22/2018 1:55 PM

## 2018-12-23 LAB — PROTIME-INR
INR: 1.1 (ref 0.8–1.2)
Prothrombin Time: 14.4 seconds (ref 11.4–15.2)

## 2018-12-23 MED ORDER — WARFARIN SODIUM 5 MG PO TABS
5.0000 mg | ORAL_TABLET | Freq: Once | ORAL | Status: AC
  Administered 2018-12-23: 17:00:00 5 mg via ORAL
  Filled 2018-12-23: qty 1

## 2018-12-23 NOTE — Progress Notes (Signed)
[address]       [place] [address]             [phone]      7 Days Post-Op Procedure(s) (LRB): AORTIC VALVE REPLACEMENT (AVR) (N/A) THORACIC ASCENDING ANEURYSM REPAIR (N/A) MAZE (N/A) TRANSESOPHAGEAL ECHOCARDIOGRAM (TEE) (N/A) CLIPPING OF ATRIAL APPENDAGE (Left) Subjective: Feels ok conts with afib, mostly rate controlled but fast at times INR remains sub herapeudic- on lovenox and plavix for now  Objective: Vital signs in last 24 hours: Temp:  [98.2 F (36.8 C)-98.6 F (37 C)] 98.6 F (37 C) (08/21 0839) Pulse Rate:  [67-109] 109 (08/21 0841) Cardiac Rhythm: Atrial fibrillation (08/21 0700) Resp:  [13-24] 19 (08/21 0839) BP: (114-144)/(72-81) 121/81 (08/21 0839) SpO2:  [98 %-99 %] 98 % (08/21 0839) Weight:  [80.3 kg] 80.3 kg (08/21 0418)  Hemodynamic parameters for last 24 hours:    Intake/Output from previous day: 08/20 0701 - 08/21 0700 In: 560 [P.O.:560] Out: 1 [Urine:1] Intake/Output this shift: No intake/output data recorded.  General appearance: alert, cooperative and no distress Heart: irregularly irregular rhythm Lungs: clear to auscultation bilaterally Abdomen: benign Extremities: no edema Wound: incis healing well  Lab Results: Recent Labs    12/20/18 1422 12/21/18 0244  WBC  --  9.8  HGB 9.2* 8.3*  HCT 27.9* 25.1*  PLT  --  189   BMET:  Recent Labs    12/21/18 0244 12/22/18 0319  NA 138 138  K 4.0 4.3  CL 103 104  CO2 26 25  GLUCOSE 103* 101*  BUN 36* 33*  CREATININE 1.90* 1.85*  CALCIUM 8.5* 8.7*    PT/INR:  Recent Labs    12/23/18 0238  LABPROT 14.4  INR 1.1   ABG    Component Value Date/Time   PHART 7.395 12/17/2018 0528   HCO3 20.0 12/17/2018 0528   TCO2 21 (L) 12/17/2018 0528   ACIDBASEDEF 4.0 (H) 12/17/2018 0528   O2SAT 98.0 12/17/2018 0528   CBG (last 3)  Recent Labs    12/20/18 1110 12/20/18 1550  GLUCAP 90 102*    Meds Scheduled Meds: . amiodarone  400 mg Oral BID  .  aspirin EC  81 mg Oral Daily  . atorvastatin  40 mg Oral Daily  . clopidogrel  75 mg Oral Daily  . docusate sodium  200 mg Oral Daily  . enoxaparin (LOVENOX) injection  40 mg Subcutaneous Q24H  . ferrous [id number] C-folic acid  1 capsule Oral TID PC  . furosemide  40 mg Oral Daily  . lisinopril  20 mg Oral Daily  . mouth rinse  15 mL Mouth Rinse BID  . metoprolol tartrate  50 mg Oral BID  . pantoprazole  40 mg Oral QAC breakfast  . sodium chloride flush  3 mL Intravenous Q12H  . warfarin  5 mg Oral ONCE-1800  . Warfarin - Pharmacist Dosing Inpatient   Does not apply q1800   Continuous Infusions: . sodium chloride     PRN Meds:.sodium chloride, acetaminophen, bisacodyl **OR** bisacodyl, magnesium hydroxide, metoprolol tartrate, ondansetron **OR** ondansetron (ZOFRAN) IV, oxyCODONE, sodium chloride flush, traMADol  Xrays No results found.  Assessment/Plan: S/P Procedure(s) (LRB): AORTIC VALVE REPLACEMENT (AVR) (N/A) THORACIC ASCENDING ANEURYSM REPAIR (N/A) MAZE (N/A) TRANSESOPHAGEAL ECHOCARDIOGRAM (TEE) (N/A) CLIPPING OF ATRIAL APPENDAGE (Left)  1 doing well, conts with afib mostly rate controlled- cont amio ,metoprolol( increased to 50 bid yesterday),  coumadin, plavix(while in hospital) and lovenox with INR only 1.1 2 HTN  control is better , on lisinopril, does not appear volume overloaded, will d/c lasix at discharge. 3 sats good on RA 4 repeat labs in am, hopefully will be stable for discharge at that times  LOS: 7 days    Rowe ClackWayne E Gold 12/23/2018

## 2018-12-23 NOTE — Progress Notes (Signed)
Discussed sternal precautions, IS, exercise, diet, and CRPII. Will refer to [place]. Encouraged more walking. Will sign off.  [id number] Yves Dill CES, ACSM 3:28 PM 12/23/2018

## 2018-12-23 NOTE — Progress Notes (Signed)
ANTICOAGULATION CONSULT NOTE  Pharmacy Consult:  Coumadin Indication: mechanical mitral valve - new 8/14  No Known Allergies  Patient Measurements: Height: 6' (182.9 cm) Weight: 177 lb 0.5 oz (80.3 kg) IBW/kg (Calculated) : 77.6  Vital Signs: Temp: 98.6 F (37 C) (08/21 0839) Temp Source: Oral (08/21 0839) BP: 121/81 (08/21 0839) Pulse Rate: 109 (08/21 0841)  Labs: Recent Labs    12/20/18 1422 12/21/18 0244 12/22/18 0319 12/23/18 0238  HGB 9.2* 8.3*  --   --   HCT 27.9* 25.1*  --   --   PLT  --  189  --   --   LABPROT  --  14.0 14.0 14.4  INR  --  1.1 1.1 1.1  CREATININE  --  1.90* 1.85*  --     Estimated Creatinine Clearance: 47.2 mL/min (A) (by C-G formula based on SCr of 1.85 mg/dL (H)).  Assessment: 52 YOM s/p mechanical AVR/thoracic aneurysm repair on 8/14 to continue on Coumadin.  INR is sub-therapeutic; no bleeding reported.  Goal of Therapy:  INR 2-3 for the first 3 months, then 1.5-2.0 Monitor platelets by anticoagulation protocol: Yes   Plan:  Repeat Coumadin 5mg  PO today Lovenox 40mg  SQ Q24H per MD, D/C when INR >/= 2 Daily PT / INR  Arieon Scalzo D. Mina Marble, PharmD, BCPS, [place] 12/23/2018, 8:49 AM

## 2018-12-24 LAB — PROTIME-INR
INR: 1.2 (ref 0.8–1.2)
Prothrombin Time: 15.5 seconds — ABNORMAL HIGH (ref 11.4–15.2)

## 2018-12-24 LAB — BASIC METABOLIC PANEL
Anion gap: 12 (ref 5–15)
BUN: 39 mg/dL — ABNORMAL HIGH (ref 6–20)
CO2: 24 mmol/L (ref 22–32)
Calcium: 8.9 mg/dL (ref 8.9–10.3)
Chloride: 103 mmol/L (ref 98–111)
Creatinine, Ser: 1.97 mg/dL — ABNORMAL HIGH (ref 0.61–1.24)
GFR calc Af Amer: 42 mL/min — ABNORMAL LOW (ref >60)
GFR calc non Af Amer: 36 mL/min — ABNORMAL LOW (ref >60)
Glucose, Bld: 102 mg/dL — ABNORMAL HIGH (ref 70–99)
Potassium: 4.3 mmol/L (ref 3.5–5.1)
Sodium: 139 mmol/L (ref 135–145)

## 2018-12-24 LAB — CBC
HCT: 29.5 % — ABNORMAL LOW (ref 39.0–52.0)
Hemoglobin: 9.5 g/dL — ABNORMAL LOW (ref 13.0–17.0)
MCH: 28.8 pg (ref 26.0–34.0)
MCHC: 32.2 g/dL (ref 30.0–36.0)
MCV: 89.4 fL (ref 80.0–100.0)
Platelets: 358 10*3/uL (ref 150–400)
RBC: 3.3 MIL/uL — ABNORMAL LOW (ref 4.22–5.81)
RDW: 15.8 % — ABNORMAL HIGH (ref 11.5–15.5)
WBC: 13.3 10*3/uL — ABNORMAL HIGH (ref 4.0–10.5)
nRBC: 0 % (ref 0.0–0.2)

## 2018-12-24 MED ORDER — WARFARIN SODIUM 7.5 MG PO TABS
7.5000 mg | ORAL_TABLET | Freq: Once | ORAL | Status: AC
  Administered 2018-12-24: 7.5 mg via ORAL
  Filled 2018-12-24: qty 1

## 2018-12-24 NOTE — Progress Notes (Signed)
CARDIAC REHAB PHASE I   Pt seen ambulating in hallway independently with steady gait. Pts HR 86. No questions regarding d/c instructions. Staying for INR.  Rufina Falco, RN BSN 12/24/2018 9:29 AM

## 2018-12-24 NOTE — Progress Notes (Signed)
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  Assessment: 49 YOM s/p mechanical AVR/thoracic aneurysm repair on 8/14 to continue on Coumadin. Pt started on Amiodarone on 8/15. INR is sub-therapeutic at 1.2 today; no bleeding reported.   Goal of Therapy:  INR 2-3 for the first 3 months, then 1.5-2.0 Monitor platelets by anticoagulation protocol: Yes   Plan:  Give Coumadin 7.5 mg PO tonight Lovenox 40mg  SQ Q24H per MD, D/C when INR >/= 2 Daily PT / INR Monitor for signs/symptoms of bleeding  Sherren Kerns, PharmD PGY1 [place] Resident [phone] 12/24/2018, 3:11 PM

## 2018-12-24 NOTE — Progress Notes (Addendum)
      [address]       [place] [address]             [phone]      8 Days Post-Op Procedure(s) (LRB): AORTIC VALVE REPLACEMENT (AVR) (N/A) THORACIC ASCENDING ANEURYSM REPAIR (N/A) MAZE (N/A) TRANSESOPHAGEAL ECHOCARDIOGRAM (TEE) (N/A) CLIPPING OF ATRIAL APPENDAGE (Left) Subjective: Feels good this morning. No pain. No SOB.  Objective: Vital signs in last 24 hours: Temp:  [98.3 F (36.8 C)-99.1 F (37.3 C)] 98.3 F (36.8 C) (08/21 1936) Pulse Rate:  [79-109] 79 (08/21 2141) Cardiac Rhythm: Normal sinus rhythm (08/21 1900) Resp:  [19-23] 22 (08/21 1936) BP: (121-149)/(65-87) 149/65 (08/21 2141) SpO2:  [98 %-100 %] 100 % (08/21 1936) Weight:  [78.1 kg] 78.1 kg (08/22 0500)     Intake/Output from previous day: 08/21 0701 - 08/22 0700 In: 480 [P.O.:480] Out: -  Intake/Output this shift: No intake/output data recorded.  General appearance: alert, cooperative and no distress Heart: regular rate and rhythm, S1, S2 normal, no murmur, click, rub or gallop Lungs: clear to auscultation bilaterally Abdomen: soft, non-tender; bowel sounds normal; no masses,  no organomegaly Extremities: extremities normal, atraumatic, no cyanosis or edema Wound: clean and dry  Lab Results: Recent Labs    12/24/18 0236  WBC 13.3*  HGB 9.5*  HCT 29.5*  PLT 358   BMET:  Recent Labs    12/22/18 0319 12/24/18 0236  NA 138 139  K 4.3 4.3  CL 104 103  CO2 25 24  GLUCOSE 101* 102*  BUN 33* 39*  CREATININE 1.85* 1.97*  CALCIUM 8.7* 8.9    PT/INR:  Recent Labs    12/24/18 0236  LABPROT 15.5*  INR 1.2   ABG    Component Value Date/Time   PHART 7.395 12/17/2018 0528   HCO3 20.0 12/17/2018 0528   TCO2 21 (L) 12/17/2018 0528   ACIDBASEDEF 4.0 (H) 12/17/2018 0528   O2SAT 98.0 12/17/2018 0528   CBG (last 3)  No results for input(s): GLUCAP in the last 72 hours.  Assessment/Plan: S/P Procedure(s) (LRB): AORTIC VALVE REPLACEMENT (AVR) (N/A) THORACIC  ASCENDING ANEURYSM REPAIR (N/A) MAZE (N/A) TRANSESOPHAGEAL ECHOCARDIOGRAM (TEE) (N/A) CLIPPING OF ATRIAL APPENDAGE (Left)  1. NSR this morning in the 80s, BP well controlled. Hx of atrial fibrillation. On Coumadin for On-X mechanical valve with an INR of 1.2. Pharmacy dosing. He is on 5mg  of coumadin (on plavix and lovenox until INR therapeutic). INR should be 1.5-2.0 with the On-X valves.  2. HTN well controlled on current regimen-continue 3. Pulm- On room air with good oxygen saturation. Continue incentive spirometer.  4. Renal-creatinine 1.97 which is close to baseline. Electrolytes okay. On 20mg  of Lasix at home for CHF so will continue at discharge. 5. H and H much improved at 9.5/29.5  Plan: Would like to see coumadin therapeutic before discharge so we can discontinue to plavix and lovenox. Goal 1.5-2.0. Pharmacy dosing. Encouraged ambulation today.    LOS: 8 days    Joe Klein 12/24/2018  Lab Results  Component Value Date   INR 1.2 12/24/2018   INR 1.1 12/23/2018   INR 1.1 12/22/2018   Continue coumadin  I have seen and examined Joe Klein and agree with the above assessment  and plan.  Grace Isaac MD Beeper [phone] Office [phone] 12/24/2018 1:38 PM

## 2018-12-24 NOTE — Plan of Care (Signed)

## 2018-12-25 LAB — PROTIME-INR
INR: 1.7 — ABNORMAL HIGH (ref 0.8–1.2)
Prothrombin Time: 19.4 seconds — ABNORMAL HIGH (ref 11.4–15.2)

## 2018-12-25 MED ORDER — OXYCODONE HCL 5 MG PO TABS: 5.0000 mg | ORAL_TABLET | Freq: Four times a day (QID) | ORAL | 0 refills | Status: DC | PRN

## 2018-12-25 MED ORDER — METOPROLOL TARTRATE 50 MG PO TABS: 50.0000 mg | ORAL_TABLET | Freq: Two times a day (BID) | ORAL | 1 refills | Status: DC

## 2018-12-25 MED ORDER — WARFARIN SODIUM 5 MG PO TABS: 5.0000 mg | ORAL_TABLET | Freq: Every day | ORAL | 1 refills | Status: DC

## 2018-12-25 MED ORDER — AMIODARONE HCL 200 MG PO TABS: 200.0000 mg | ORAL_TABLET | Freq: Two times a day (BID) | ORAL | 1 refills | Status: DC

## 2018-12-25 MED ORDER — ACETAMINOPHEN 325 MG PO TABS: 650.0000 mg | ORAL_TABLET | Freq: Four times a day (QID) | ORAL | Status: DC | PRN

## 2018-12-25 NOTE — Progress Notes (Addendum)
      [address]       [place], [address]             [phone]      9 Days Post-Op Procedure(s) (LRB): AORTIC VALVE REPLACEMENT (AVR) (N/A) THORACIC ASCENDING ANEURYSM REPAIR (N/A) MAZE (N/A) TRANSESOPHAGEAL ECHOCARDIOGRAM (TEE) (N/A) CLIPPING OF ATRIAL APPENDAGE (Left) Subjective: Feels good this morning.   Objective: Vital signs in last 24 hours: Temp:  [97.8 F (36.6 C)-98.6 F (37 C)] 98.6 F (37 C) (08/23 0529) Pulse Rate:  [62-73] 73 (08/23 0529) Cardiac Rhythm: Normal sinus rhythm (08/22 1950) Resp:  [19-21] 21 (08/23 0900) BP: (131-144)/(72-87) 144/87 (08/23 0529) SpO2:  [99 %-100 %] 100 % (08/23 0529) Weight:  [76.9 kg] 76.9 kg (08/23 0529)     Intake/Output from previous day: 08/22 0701 - 08/23 0700 In: 890 [P.O.:890] Out: -  Intake/Output this shift: No intake/output data recorded.  General appearance: alert, cooperative and no distress Heart: regular rate and rhythm, S1, S2 normal, no murmur, click, rub or gallop Lungs: clear to auscultation bilaterally Abdomen: soft, non-tender; bowel sounds normal; no masses,  no organomegaly Extremities: extremities normal, atraumatic, no cyanosis or edema Wound: clean and dry  Lab Results: Recent Labs    12/24/18 0236  WBC 13.3*  HGB 9.5*  HCT 29.5*  PLT 358   BMET:  Recent Labs    12/24/18 0236  NA 139  K 4.3  CL 103  CO2 24  GLUCOSE 102*  BUN 39*  CREATININE 1.97*  CALCIUM 8.9    PT/INR:  Recent Labs    12/25/18 0731  LABPROT 19.4*  INR 1.7*   ABG    Component Value Date/Time   PHART 7.395 12/17/2018 0528   HCO3 20.0 12/17/2018 0528   TCO2 21 (L) 12/17/2018 0528   ACIDBASEDEF 4.0 (H) 12/17/2018 0528   O2SAT 98.0 12/17/2018 0528   CBG (last 3)  No results for input(s): GLUCAP in the last 72 hours.  Assessment/Plan: S/P Procedure(s) (LRB): AORTIC VALVE REPLACEMENT (AVR) (N/A) THORACIC ASCENDING ANEURYSM REPAIR (N/A) MAZE (N/A) TRANSESOPHAGEAL  ECHOCARDIOGRAM (TEE) (N/A) CLIPPING OF ATRIAL APPENDAGE (Left)  1. NSR this morning in the 80s, BP well controlled. Hx of atrial fibrillation. On Coumadin for On-X mechanical valve with an INR of 1.7. Pharmacy dosing. He is on 7.5mg  of coumadin (on plavix and lovenox until INR therapeutic).  2. HTN well controlled on current regimen-continue 3. Pulm- On room air with good oxygen saturation. Continue incentive spirometer.  4. Renal-creatinine 1.97 which is close to baseline. Electrolytes okay. On 20mg  of Lasix at home for CHF so will continue at discharge. 5. H and H much improved at 9.5/29.5  Plan: Discussion with pharmacy-patient's INR should be 2.0-3.0 for the first 3 months then 1.5-2.0 the following months per the On-X valve recommendations. Will discuss with Dr. Servando Snare. Patient's INR is 1.7 today. Should still be okay for discharge since trending up.    LOS: 9 days    Elgie Collard 12/25/2018  Patient feels well, wants to go home, will d/c home on coumadin 5 mg today /daily and has coumadin clinic appointment tomorrow  Wounds inatct I have seen and examined Nunzio Cobbs and agree with the above assessment  and plan.  Grace Isaac MD Beeper [phone] Office [phone] 12/25/2018 1:00 PM

## 2018-12-25 NOTE — Plan of Care (Signed)

## 2018-12-25 NOTE — Progress Notes (Signed)
Mr. Pheasant given discharge instructions.  Discussed follow up appointments and locations.  Discussed new medications, medications changes and side effects; schedule of medications given. Discussed signs and symptoms to watch for and when to contact the physician.  Verbalized understanding.

## 2018-12-26 ENCOUNTER — Other Ambulatory Visit: Payer: Self-pay

## 2018-12-26 ENCOUNTER — Ambulatory Visit (INDEPENDENT_AMBULATORY_CARE_PROVIDER_SITE_OTHER): Payer: Self-pay | Admitting: *Deleted

## 2018-12-26 DIAGNOSIS — Z7901 Long term (current) use of anticoagulants: Secondary | ICD-10-CM

## 2018-12-26 DIAGNOSIS — I4891 Unspecified atrial fibrillation: Secondary | ICD-10-CM

## 2018-12-26 DIAGNOSIS — Z5181 Encounter for therapeutic drug level monitoring: Secondary | ICD-10-CM

## 2018-12-26 DIAGNOSIS — Q231 Congenital insufficiency of aortic valve: Secondary | ICD-10-CM

## 2018-12-26 HISTORY — DX: Unspecified atrial fibrillation: I48.91

## 2018-12-26 HISTORY — DX: Encounter for therapeutic drug level monitoring: Z51.81

## 2018-12-26 HISTORY — DX: Long term (current) use of anticoagulants: Z79.01

## 2018-12-26 LAB — POCT INR: INR: 2.4 (ref 2.0–3.0)

## 2018-12-26 NOTE — Patient Instructions (Addendum)
A full discussion of the nature of anticoagulants has been carried out.  A benefit risk analysis has been presented to the patient, so that they understand the justification for choosing anticoagulation at this time. The need for frequent and regular monitoring, precise dosage adjustment and compliance is stressed.  Side effects of potential bleeding are discussed.  The patient should avoid any OTC items containing aspirin or ibuprofen, and should avoid great swings in general diet.  Avoid alcohol consumption.  Call if any signs of abnormal bleeding.   Description   Start taking 1/2 tablet daily except for 1 tablet on Sunday, Tuesday and Thursday. Call Coumadin Clinic with any medicaiton changes or up coming procedures. Coumadin Clinic # [phone]

## 2019-01-02 ENCOUNTER — Ambulatory Visit (INDEPENDENT_AMBULATORY_CARE_PROVIDER_SITE_OTHER): Payer: Self-pay

## 2019-01-02 ENCOUNTER — Other Ambulatory Visit: Payer: Self-pay

## 2019-01-02 DIAGNOSIS — Q2381 Bicuspid aortic valve: Secondary | ICD-10-CM

## 2019-01-02 DIAGNOSIS — I4891 Unspecified atrial fibrillation: Secondary | ICD-10-CM

## 2019-01-02 DIAGNOSIS — Z7901 Long term (current) use of anticoagulants: Secondary | ICD-10-CM

## 2019-01-02 DIAGNOSIS — Q231 Congenital insufficiency of aortic valve: Secondary | ICD-10-CM

## 2019-01-02 DIAGNOSIS — Z5181 Encounter for therapeutic drug level monitoring: Secondary | ICD-10-CM

## 2019-01-02 LAB — POCT INR: INR: 5.6 — AB (ref 2.0–3.0)

## 2019-01-02 NOTE — Patient Instructions (Signed)
Description   Skip Coumadin today and tomorrow, then start taking 1/2 tablet daily. Call Coumadin Clinic with any medicaiton changes or up coming procedures. Coumadin Clinic # [phone]

## 2019-01-05 NOTE — Progress Notes (Signed)
Cardiology Office Note:    Date:  01/06/2019   ID:  Joe Klein, DOB 03/21/1961, MRN [id number]  PCP:  Crist FatVan Eyk, Jason, MD  Cardiologist:  Norman HerrlichBrian Munley, MD    Referring MD: Leonia ReaderVan Eyk, Barbara CowerJason, MD    ASSESSMENT:    1. S/P AVR (aortic valve replacement)   2. Paroxysmal atrial fibrillation (HCC)   3. Long term (current) use of anticoagulants   4. 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 Chronic combined systolic and diastolic heart failure (HCC)   6. CKD (chronic kidney disease) stage 3, GFR 30-59 ml/min (HCC)    PLAN:    In order of problems listed above:  1. He has made a quick and market improvement following AVR and thoracic aortic surgery exhibits no findings of heart failure blood pressure is relatively low diuretic stopped ACE inhibitor will be held renal function rechecked continue anticoagulation through our warfarin clinic and referral for cardiac rehabilitation.  Next visit amiodarone be discontinued.  With his valve long-term he can have INRs to 1.5 as he has had left atrial appendage occlusion.  I warned him hypertension can recur and will need to follow closely he will purchase device for home and has arrangements for follow-up in the office in 1 month.   Next appointment: 1 month   Medication Adjustments/Labs and Tests Ordered: Current medicines are reviewed at length with the patient today.  Concerns regarding medicines are outlined above.  No orders of the defined types were placed in this encounter.  No orders of the defined types were placed in this encounter.   Chief Complaint  Patient presents with  . Follow-up    after AVR  . Atrial Fibrillation  . Anticoagulation  . Congestive Heart Failure  . Hypertension  . Chronic Kidney Disease    History of Present Illness:    Joe MorrowChristopher Klein is a 58 y.o. male with a hx of heart failure,  bicuspid aortic valve aneurysm ascending aorta, hypertension with chronic kidney disease stage III and combined systolic and diastolic heart failure and recent CT surgery who was last seen  11/30/2018 . Compliance with diet, lifestyle and medications: Yes  He is in good spirits very appreciative of his care feels markedly improved and is motivated to make a complete full recovery.  He was seen by CT surgery in the last few days.  He has arrangements for follow-up care in the anticoagulation clinic.  He has had no bleeding complications anticoagulant and has good healthcare literacy and knowledge of the anticoagulation requirements for his AVR.  He has had episodes of lightheadedness but no syncope no edema shortness of breath chest pain palpitation or TIA.  He has had no wound drainage.  After discussion [facility]  I reviewed the recommendations from CT surgery will stop his diuretic and hold his ACE inhibitor and reduce dose for systolic blood pressures less than 105.  I subsequently received lab work his creatinine is significantly elevated and should respond quickly to these endeavors and will recheck him next week his hemoglobin is normalized.  I reduced his amiodarone to once daily and will discontinue at the next visit.  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  Discussion with pharmacy-patient's INR should be 2.0-3.0 for the first 3 months then 1.5-2.0 the following months per the On-X valve recommendations  I reviewed his operative  note with him  [name], MD      Procedure Laterality Anesthesia  AORTIC VALVE REPLACEMENT (AVR) 23 mm On-X mechanical valve N/A General  THORACIC ASCENDING ANEURYSM REPAIR 28 mm Hemashield platinum dacryon proximally and a distal hemi-arch reconstruction  N/A General  MAZE N/A General  TRANSESOPHAGEAL ECHOCARDIOGRAM (TEE) N/A General  CLIPPING OF ATRIAL APPENDAGE Left General  Appendage clipping with  AtriCure AtriClip 50.        Past Medical History:  Diagnosis Date  . AAA (abdominal aortic aneurysm) ([facility])   . Aortic stenosis   . CHF (congestive heart failure) ([facility])   . Chronic kidney disease    stage 3  . Dysrhythmia    A-fib  . Hypertension     Past Surgical History:  Procedure Laterality Date  . AORTIC VALVE REPLACEMENT N/A 12/16/2018   Procedure: AORTIC VALVE REPLACEMENT (AVR);  Surgeon: [name], MD;  Location: [facility];  Service: Open Heart Surgery;  Laterality: N/A;  . CARDIOVERSION N/A 11/21/2018   Procedure: CARDIOVERSION (CATH LAB);  Surgeon: Evans Lance, MD;  Location: [facility];  Service: Cardiovascular;  Laterality: N/A;  . CLIPPING OF ATRIAL APPENDAGE Left 12/16/2018   Procedure: CLIPPING OF ATRIAL APPENDAGE;  Surgeon: [name], MD;  Location: [facility];  Service: Open Heart Surgery;  Laterality: Left;  Appendage clipping with AtriCure AtriClip 50.  Marland Kitchen MANDIBLE FRACTURE SURGERY    . MAZE N/A 12/16/2018   Procedure: MAZE;  Surgeon: [name], MD;  Location: [facility];  Service: Open Heart Surgery;  Laterality: N/A;  . RIGHT/LEFT HEART CATH AND CORONARY ANGIOGRAPHY N/A 11/18/2018   Procedure: RIGHT/LEFT HEART CATH AND CORONARY ANGIOGRAPHY;  Surgeon: Jettie Booze, MD;  Location: [facility];  Service: Cardiovascular;  Laterality: N/A;  . TEE WITHOUT CARDIOVERSION N/A 12/16/2018   Procedure: TRANSESOPHAGEAL ECHOCARDIOGRAM (TEE);  Surgeon: [name], MD;  Location: [facility];  Service: Open Heart Surgery;  Laterality: N/A;  . THORACIC AORTIC ANEURYSM REPAIR N/A 12/16/2018   Procedure: THORACIC ASCENDING ANEURYSM REPAIR;  Surgeon: [name], MD;  Location: [facility];  Service: Open Heart Surgery;  Laterality: N/A;  . TONSILLECTOMY      Current Medications: Current Meds  Medication Sig  . amiodarone (PACERONE) 200 MG tablet Take 1 tablet (200 mg total) by mouth 2 (two) times daily.  Marland Kitchen aspirin 81 MG EC tablet Take 81 mg by  mouth daily.   Marland Kitchen atorvastatin (LIPITOR) 40 MG tablet Take 40 mg by mouth daily.  . furosemide (LASIX) 20 MG tablet Take 20 mg by mouth daily.  Marland Kitchen lisinopril (ZESTRIL) 20 MG tablet Take 20 mg by mouth daily.   . metoprolol tartrate (LOPRESSOR) 50 MG tablet Take 1 tablet (50 mg total) by mouth 2 (two) times daily.  . potassium chloride (K-DUR) 10 MEQ tablet Take 10 mEq by mouth daily.  Marland Kitchen warfarin (COUMADIN) 5 MG tablet Take 1 tablet (5 mg total) by mouth daily.     Allergies:   Patient has no known allergies.   Social History   Socioeconomic History  . Marital status: Single    Spouse name: Not on file  . Number of children: Not on file  . Years of education: Not on file  . Highest education level: Not on file  Occupational History  . Not on file  Social Needs  . Financial resource strain: Not on file  . Food insecurity    Worry: Not on file  Inability: Not on file  . Transportation needs    Medical: Not on file    Non-medical: Not on file  Tobacco Use  . Smoking status: Never Smoker  . Smokeless tobacco: Never Used  Substance and Sexual Activity  . Alcohol use: Never    Frequency: Never  . Drug use: Never  . Sexual activity: Not on file  Lifestyle  . Physical activity    Days per week: Not on file    Minutes per session: Not on file  . Stress: Not on file  Relationships  . Social Musician on phone: Not on file    Gets together: Not on file    Attends religious service: Not on file    Active member of club or organization: Not on file    Attends meetings of clubs or organizations: Not on file    Relationship status: Not on file  Other Topics Concern  . Not on file  Social History Narrative  . Not on file     Family History: The patient's family history includes Cancer in his father; Heart disease in his mother; Hypertension in his brother and mother. ROS:   Please see the history of present illness.    All other systems reviewed and are negative.   EKGs/Labs/Other Studies Reviewed:    The following studies were reviewed today:  EKG:  EKG personally reviewed demonstrates sinus rhythm left bundle branch block handcarried by the patient performed outside  Recent Labs: 12/12/2018: ALT 32 12/17/2018: Magnesium 2.6 12/24/2018: BUN 39; Creatinine, Ser 1.97; Hemoglobin 9.5; Platelets 358; Potassium 4.3; Sodium 139  Recent Lipid Panel No results found for: CHOL, TRIG, HDL, CHOLHDL, VLDL, LDLCALC, LDLDIRECT  Physical Exam:    VS:  BP 100/72 (BP Location: Right Arm, Patient Position: Sitting, Cuff Size: Normal)   Pulse (!) 58   Ht 6' (1.829 m)   Wt 170 lb 12.8 oz (77.5 kg)   SpO2 98%   BMI 23.16 kg/m     Wt Readings from Last 3 Encounters:  01/06/19 170 lb 12.8 oz (77.5 kg)  01/06/19 166 lb (75.3 kg)  12/25/18 169 lb 8 oz (76.9 kg)     GEN: Appears markedly improved no pallor the skin or membranes well nourished, well developed in no acute distress HEENT: Normal NECK: No JVD; No carotid bruits LYMPHATICS: No lymphadenopathy CARDIAC: Sharp closing sound particular no aortic regurgitation RRR, no murmurs, rubs, gallops RESPIRATORY:  Clear to auscultation without rales, wheezing or rhonchi  ABDOMEN: Soft, non-tender, non-distended MUSCULOSKELETAL:  No edema; No deformity  SKIN: Warm and dry NEUROLOGIC:  Alert and oriented x 3 PSYCHIATRIC:  Normal affect    Signed, Norman Herrlich, MD  01/06/2019 3:59 PM    [facility]

## 2019-01-06 ENCOUNTER — Other Ambulatory Visit: Payer: Self-pay | Admitting: Cardiothoracic Surgery

## 2019-01-06 ENCOUNTER — Ambulatory Visit
Admission: RE | Admit: 2019-01-06 | Discharge: 2019-01-06 | Disposition: A | Discharge status: Home / Self Care | Payer: Self-pay | Source: Ambulatory Visit | Attending: Cardiothoracic Surgery | Admitting: Cardiothoracic Surgery

## 2019-01-06 ENCOUNTER — Encounter: Payer: Self-pay | Admitting: Cardiothoracic Surgery

## 2019-01-06 ENCOUNTER — Ambulatory Visit (INDEPENDENT_AMBULATORY_CARE_PROVIDER_SITE_OTHER): Payer: Self-pay | Admitting: Cardiology

## 2019-01-06 ENCOUNTER — Other Ambulatory Visit: Payer: Self-pay

## 2019-01-06 ENCOUNTER — Ambulatory Visit (INDEPENDENT_AMBULATORY_CARE_PROVIDER_SITE_OTHER): Payer: Self-pay | Admitting: Cardiothoracic Surgery

## 2019-01-06 VITALS — BP 100/72 | HR 58 | Ht 72.0 in | Wt 170.8 lb

## 2019-01-06 VITALS — BP 114/75 | HR 56 | Temp 97.3°F | Resp 16 | Ht 72.0 in | Wt 166.0 lb

## 2019-01-06 DIAGNOSIS — Z7901 Long term (current) use of anticoagulants: Secondary | ICD-10-CM

## 2019-01-06 DIAGNOSIS — Z952 Presence of prosthetic heart valve: Secondary | ICD-10-CM

## 2019-01-06 DIAGNOSIS — I480 Paroxysmal atrial fibrillation: Secondary | ICD-10-CM

## 2019-01-06 DIAGNOSIS — Z8679 Personal history of other diseases of the circulatory system: Secondary | ICD-10-CM

## 2019-01-06 DIAGNOSIS — Z09 Encounter for follow-up examination after completed treatment for conditions other than malignant neoplasm: Secondary | ICD-10-CM

## 2019-01-06 DIAGNOSIS — N183 Chronic kidney disease, stage 3 unspecified: Secondary | ICD-10-CM

## 2019-01-06 DIAGNOSIS — I5042 Chronic combined systolic (congestive) and diastolic (congestive) heart failure: Secondary | ICD-10-CM

## 2019-01-06 DIAGNOSIS — I130 Hypertensive heart and chronic kidney disease with heart failure and stage 1 through stage 4 chronic kidney disease, or unspecified chronic kidney disease: Secondary | ICD-10-CM

## 2019-01-06 DIAGNOSIS — Z95828 Presence of other vascular implants and grafts: Secondary | ICD-10-CM

## 2019-01-06 MED ORDER — LISINOPRIL 20 MG PO TABS: 10.0000 mg | ORAL_TABLET | Freq: Every day | ORAL | Status: DC

## 2019-01-06 MED ORDER — AMIODARONE HCL 200 MG PO TABS: 200.0000 mg | ORAL_TABLET | Freq: Every day | ORAL | 1 refills | Status: DC

## 2019-01-06 MED ORDER — FUROSEMIDE 20 MG PO TABS: ORAL_TABLET | ORAL | Status: DC

## 2019-01-06 NOTE — Patient Instructions (Signed)
Medication Instructions:  Your physician has recommended you make the following change in your medication:  DECREASE furosemide (lasix) 20 mg: Take 1 tablet daily as needed if weight is 172 pounds or greater.   DECREASE lisinopril (zestril) 20 mg: Take 0.5 tablet daily. Do NOT take this medication if your systolic BP (top number) is less than 105.   DECREASE amiodarone (pacerone) 200 mg: Take 1 tablet daily.   If you need a refill on your cardiac medications before your next appointment, please call your pharmacy.   Lab work: Your physician recommends that you return for lab work today: CBC, CMP, ProBNP, TSH.   If you have labs (blood work) drawn today and your tests are completely normal, you will receive your results only by: Marland Kitchen MyChart Message (if you have MyChart) OR . A paper copy in the mail If you have any lab test that is abnormal or we need to change your treatment, we will call you to review the results.  Testing/Procedures: None  Follow-Up: At [facility], you and your health needs are our priority.  As part of our continuing mission to provide you with exceptional heart care, we have created designated Provider Care Teams.  These Care Teams include your primary Cardiologist (physician) and Advanced Practice Providers (APPs -  Physician Assistants and Nurse Practitioners) who all work together to provide you with the care you need, when you need it. You will need a follow up appointment in 6 weeks.

## 2019-01-06 NOTE — Progress Notes (Signed)
[address]       [place] [address]             [phone]     CARDIOTHORACIC SURGERY OFFICE NOTE  Referring Provider is Richardo Priest, MD Primary Cardiologist is No primary care provider on file. PCP is Nona Dell, Corene Cornea, MD   HPI:  Mr. Cicero is a 58 year old gentleman status post replacement of ascending aorta, aortic valve replacement with an On-x mechanical valve, and maze procedure 3 weeks ago.  He has been doing well.  He notes postural dizziness.  Denies palpitations.  Current Outpatient Medications  Medication Sig Dispense Refill  . acetaminophen (TYLENOL) 325 MG tablet Take 2 tablets (650 mg total) by mouth every 6 (six) hours as needed for mild pain.    Marland Kitchen amiodarone (PACERONE) 200 MG tablet Take 1 tablet (200 mg total) by mouth 2 (two) times daily. 60 tablet 1  . aspirin 81 MG EC tablet Take 81 mg by mouth daily.     Marland Kitchen atorvastatin (LIPITOR) 40 MG tablet Take 40 mg by mouth daily.    . furosemide (LASIX) 20 MG tablet Take 20 mg by mouth daily.    Marland Kitchen lisinopril (ZESTRIL) 20 MG tablet Take 20 mg by mouth daily.     . metoprolol tartrate (LOPRESSOR) 50 MG tablet Take 1 tablet (50 mg total) by mouth 2 (two) times daily. 60 tablet 1  . potassium chloride (K-DUR) 10 MEQ tablet Take 10 mEq by mouth daily.    Marland Kitchen warfarin (COUMADIN) 5 MG tablet Take 1 tablet (5 mg total) by mouth daily. 30 tablet 1  . oxyCODONE (OXY IR/ROXICODONE) 5 MG immediate release tablet Take 1 tablet (5 mg total) by mouth every 6 (six) hours as needed for severe pain. (Patient not taking: Reported on 01/06/2019) 30 tablet 0   No current facility-administered medications for this visit.       Physical Exam:   BP 114/75 (BP Location: Left Arm, Patient Position: Sitting, Cuff Size: Normal)   Pulse (!) 56   Temp (!) 97.3 F (36.3 C)   Resp 16   Ht 6' (1.829 m)   Wt 75.3 kg   SpO2 98% Comment: RA  BMI 22.51 kg/m   General:  Well-appearing  Chest:   Well-healed incisions  CV:    Regular rate and rhythm, mechanical click    Extremities:  No edema  Diagnostic Tests:  Chest x-ray with clear lung fields   Impression:  Doing well after replacement of a sending aorta and aortic valve replacement with a mechanical valve  Plan:  Recommend stopping amiodarone, Lasix and potassium.  Would consider decreasing metoprolol dose; follow up with CT surgery as needed Following up with Dr. Bettina Gavia later today  I spent in excess of 15 minutes during the conduct of this office consultation and >50% of this time involved direct face-to-face encounter with the patient for counseling and/or coordination of their care.  Level 2                 10 minutes Level 3                 15 minutes Level 4                 25 minutes Level 5                 40 minutes  Bond Grieshop Z. Orvan Seen, [place] 01/06/2019 11:49 AM

## 2019-01-07 LAB — COMPREHENSIVE METABOLIC PANEL
ALT: 35 IU/L (ref 0–44)
AST: 20 IU/L (ref 0–40)
Albumin/Globulin Ratio: 0.9 — ABNORMAL LOW (ref 1.2–2.2)
Albumin: 3.9 g/dL (ref 3.8–4.9)
Alkaline Phosphatase: 122 IU/L — ABNORMAL HIGH (ref 39–117)
BUN/Creatinine Ratio: 19 (ref 9–20)
BUN: 65 mg/dL — ABNORMAL HIGH (ref 6–24)
Bilirubin Total: 0.4 mg/dL (ref 0.0–1.2)
CO2: 23 mmol/L (ref 20–29)
Calcium: 9.5 mg/dL (ref 8.7–10.2)
Chloride: 98 mmol/L (ref 96–106)
Creatinine, Ser: 3.36 mg/dL — ABNORMAL HIGH (ref 0.76–1.27)
GFR calc Af Amer: 22 mL/min/{1.73_m2} — ABNORMAL LOW (ref >59)
GFR calc non Af Amer: 19 mL/min/{1.73_m2} — ABNORMAL LOW (ref >59)
Globulin, Total: 4.4 g/dL (ref 1.5–4.5)
Glucose: 88 mg/dL (ref 65–99)
Potassium: 6 mmol/L — ABNORMAL HIGH (ref 3.5–5.2)
Sodium: 136 mmol/L (ref 134–144)
Total Protein: 8.3 g/dL (ref 6.0–8.5)

## 2019-01-07 LAB — PRO B NATRIURETIC PEPTIDE: NT-Pro BNP: 1316 pg/mL — ABNORMAL HIGH (ref 0–210)

## 2019-01-07 LAB — CBC
Hematocrit: 36.5 % — ABNORMAL LOW (ref 37.5–51.0)
Hemoglobin: 12.4 g/dL — ABNORMAL LOW (ref 13.0–17.7)
MCH: 29.7 pg (ref 26.6–33.0)
MCHC: 34 g/dL (ref 31.5–35.7)
MCV: 88 fL (ref 79–97)
Platelets: 605 10*3/uL — ABNORMAL HIGH (ref 150–450)
RBC: 4.17 x10E6/uL (ref 4.14–5.80)
RDW: 14.3 % (ref 11.6–15.4)
WBC: 12 10*3/uL — ABNORMAL HIGH (ref 3.4–10.8)

## 2019-01-07 LAB — TSH: TSH: 2.33 u[IU]/mL (ref 0.450–4.500)

## 2019-01-10 ENCOUNTER — Ambulatory Visit (INDEPENDENT_AMBULATORY_CARE_PROVIDER_SITE_OTHER): Payer: Self-pay | Admitting: *Deleted

## 2019-01-10 ENCOUNTER — Telehealth: Payer: Self-pay

## 2019-01-10 ENCOUNTER — Telehealth: Payer: Self-pay | Admitting: *Deleted

## 2019-01-10 ENCOUNTER — Other Ambulatory Visit: Payer: Self-pay

## 2019-01-10 DIAGNOSIS — Z7901 Long term (current) use of anticoagulants: Secondary | ICD-10-CM

## 2019-01-10 DIAGNOSIS — I4891 Unspecified atrial fibrillation: Secondary | ICD-10-CM

## 2019-01-10 DIAGNOSIS — Q231 Congenital insufficiency of aortic valve: Secondary | ICD-10-CM

## 2019-01-10 DIAGNOSIS — Z5181 Encounter for therapeutic drug level monitoring: Secondary | ICD-10-CM

## 2019-01-10 DIAGNOSIS — I5042 Chronic combined systolic (congestive) and diastolic (congestive) heart failure: Secondary | ICD-10-CM

## 2019-01-10 LAB — POCT INR: INR: 5 — AB (ref 2.0–3.0)

## 2019-01-10 MED ORDER — WARFARIN SODIUM 2.5 MG PO TABS: 2.5000 mg | ORAL_TABLET | ORAL | 1 refills | Status: DC

## 2019-01-10 NOTE — Patient Instructions (Signed)
Skip Coumadin today and tomorrow, then start taking 2.5mg  daily except 1.25mg  on Sundays and Thursdays. Recheck in on week.  Call Coumadin Clinic with any medicaiton changes or up coming procedures. Coumadin Clinic # [phone]

## 2019-01-10 NOTE — Telephone Encounter (Signed)
-----   Message from Richardo Priest, MD sent at 01/07/2019 11:07 AM EDT ----- His hemoglobin is now normal but his renal function is markedly worsened.  Fortunately I stopped his diuretic the other day and reduced his lisinopril and I think he quickly recovered but 7 do a repeat BMP this week Tuesday or Wednesday and he can do to either the [place] or the med Center office what ever is most convenient.  In the interim but should stop his lisinopril completely

## 2019-01-10 NOTE — Addendum Note (Signed)
Addended by: Ashok Norris on: 01/10/2019 02:51 PM   Modules accepted: Orders

## 2019-01-10 NOTE — Telephone Encounter (Signed)
Left message to return call to discuss lab results

## 2019-01-10 NOTE — Telephone Encounter (Signed)
Patient advised to hold lisinopril per Dr Bettina Gavia.   Patient will recheck BMP today.  He states that he stopped potassium over the weekend when he stopped furosemide.  Patient agreed to plan and verbalized understanding.  No further questions.

## 2019-01-11 ENCOUNTER — Telehealth: Payer: Self-pay

## 2019-01-11 DIAGNOSIS — N183 Chronic kidney disease, stage 3 unspecified: Secondary | ICD-10-CM

## 2019-01-11 LAB — BASIC METABOLIC PANEL
BUN/Creatinine Ratio: 22 — ABNORMAL HIGH (ref 9–20)
BUN: 63 mg/dL — ABNORMAL HIGH (ref 6–24)
CO2: 22 mmol/L (ref 20–29)
Calcium: 9.4 mg/dL (ref 8.7–10.2)
Chloride: 101 mmol/L (ref 96–106)
Creatinine, Ser: 2.92 mg/dL — ABNORMAL HIGH (ref 0.76–1.27)
GFR calc Af Amer: 26 mL/min/{1.73_m2} — ABNORMAL LOW (ref >59)
GFR calc non Af Amer: 22 mL/min/{1.73_m2} — ABNORMAL LOW (ref >59)
Glucose: 102 mg/dL — ABNORMAL HIGH (ref 65–99)
Potassium: 5.8 mmol/L — ABNORMAL HIGH (ref 3.5–5.2)
Sodium: 138 mmol/L (ref 134–144)

## 2019-01-11 NOTE — Telephone Encounter (Signed)
Patient informed of lab results and advised to have repeat lab work (BMP) on Tuesday, 01/17/2019, when he goes to the [place] office for his coumadin check. Patient will remain off lisinopril at this time. Patient is agreeable to plan and verbalized understanding. No further questions.

## 2019-01-11 NOTE — Telephone Encounter (Signed)
Left message for patient to return call for lab results.  Will continue efforts.  

## 2019-01-17 ENCOUNTER — Ambulatory Visit (INDEPENDENT_AMBULATORY_CARE_PROVIDER_SITE_OTHER): Payer: Self-pay | Admitting: Pharmacist

## 2019-01-17 ENCOUNTER — Other Ambulatory Visit: Payer: Self-pay

## 2019-01-17 DIAGNOSIS — I4891 Unspecified atrial fibrillation: Secondary | ICD-10-CM

## 2019-01-17 DIAGNOSIS — Z7901 Long term (current) use of anticoagulants: Secondary | ICD-10-CM

## 2019-01-17 DIAGNOSIS — Z5181 Encounter for therapeutic drug level monitoring: Secondary | ICD-10-CM

## 2019-01-17 DIAGNOSIS — Q231 Congenital insufficiency of aortic valve: Secondary | ICD-10-CM

## 2019-01-17 LAB — POCT INR: INR: 3.3 — AB (ref 2.0–3.0)

## 2019-01-17 NOTE — Patient Instructions (Signed)
Description   Skip Coumadin today, then start taking 2.5mg  daily except 1.25mg  on Sundays, Tuesdays, and Thursdays. Recheck in 1 week.  Call Coumadin Clinic with any medicaiton changes or up coming procedures. Coumadin Clinic # [phone]

## 2019-01-18 LAB — BASIC METABOLIC PANEL
BUN/Creatinine Ratio: 16 (ref 9–20)
BUN: 34 mg/dL — ABNORMAL HIGH (ref 6–24)
CO2: 18 mmol/L — ABNORMAL LOW (ref 20–29)
Calcium: 9 mg/dL (ref 8.7–10.2)
Chloride: 106 mmol/L (ref 96–106)
Creatinine, Ser: 2.08 mg/dL — ABNORMAL HIGH (ref 0.76–1.27)
GFR calc Af Amer: 39 mL/min/{1.73_m2} — ABNORMAL LOW (ref >59)
GFR calc non Af Amer: 34 mL/min/{1.73_m2} — ABNORMAL LOW (ref >59)
Glucose: 108 mg/dL — ABNORMAL HIGH (ref 65–99)
Potassium: 4.6 mmol/L (ref 3.5–5.2)
Sodium: 138 mmol/L (ref 134–144)

## 2019-01-20 ENCOUNTER — Telehealth: Payer: Self-pay | Admitting: Cardiology

## 2019-01-20 NOTE — Telephone Encounter (Signed)
Pt came by office asking to speak to Elmyra Ricks because his BP was greater than 200.  Elmyra Ricks was in with patient so I asked Mickel Baas, RN and she advised for pt to go to ER.  Pt verbalized understanding.

## 2019-01-23 ENCOUNTER — Other Ambulatory Visit: Payer: Self-pay | Admitting: *Deleted

## 2019-01-23 ENCOUNTER — Encounter: Payer: Self-pay | Admitting: *Deleted

## 2019-01-23 ENCOUNTER — Telehealth: Payer: Self-pay | Admitting: Cardiology

## 2019-01-23 DIAGNOSIS — I10 Essential (primary) hypertension: Secondary | ICD-10-CM

## 2019-01-23 NOTE — Telephone Encounter (Signed)
Patient called and went to the ER over weekend and had issues with BP and he wanted to follow up with [place] from the visit. He called and wanted to speak to you Elmyra Ricks.

## 2019-01-23 NOTE — Telephone Encounter (Signed)
Thank you!   Reviewed note/lab from [facility]. Will plan to see 01/25/19.

## 2019-01-23 NOTE — Telephone Encounter (Signed)
Patient states that he was instructed by our office to report to [facility] ED with elevated blood pressure.  He was treated and released from ED, and was started on on amlodipine 5mg  daily for systolic BP's over 818.   Patient was advised today to continue monitoring BP at home and to follow up with Laurann Montana, NP on 01-25-2019 to further discuss treatment of elevated blood pressures.  He has not been checking his BP since being released from ED.  He will start checking his BP at home today and bring those readings to his appointment.  Patient agreed to plan and verbalized understanding. No further questions.

## 2019-01-23 NOTE — Telephone Encounter (Signed)
They put hm on Amlodipine also 5mg  takes 1 daily.

## 2019-01-23 NOTE — Telephone Encounter (Signed)
Left message to return call 

## 2019-01-23 NOTE — Progress Notes (Signed)
Mr. Jiles called to relay that he went to [facility] ED Friday per his cardiologist's office due to elevated blood pressures.  He was seen and Amlodipine 5 mg daily was added to his medications. I have updated his med list in Epic.

## 2019-01-24 ENCOUNTER — Ambulatory Visit (INDEPENDENT_AMBULATORY_CARE_PROVIDER_SITE_OTHER): Payer: Self-pay | Admitting: Pharmacist Clinician (PhC)/ Clinical Pharmacy Specialist

## 2019-01-24 ENCOUNTER — Other Ambulatory Visit: Payer: Self-pay

## 2019-01-24 DIAGNOSIS — Z7901 Long term (current) use of anticoagulants: Secondary | ICD-10-CM

## 2019-01-24 DIAGNOSIS — Q231 Congenital insufficiency of aortic valve: Secondary | ICD-10-CM

## 2019-01-24 DIAGNOSIS — Q2381 Bicuspid aortic valve: Secondary | ICD-10-CM

## 2019-01-24 DIAGNOSIS — Z5181 Encounter for therapeutic drug level monitoring: Secondary | ICD-10-CM

## 2019-01-24 DIAGNOSIS — I4891 Unspecified atrial fibrillation: Secondary | ICD-10-CM

## 2019-01-24 LAB — POCT INR: INR: 2.2 (ref 2.0–3.0)

## 2019-01-24 NOTE — Progress Notes (Addendum)
Office Visit    Patient Name: Joe Klein Date of Encounter: 01/25/2019  Primary Care Provider:  Townsend Roger, MD Primary Cardiologist:  Shirlee More, MD Electrophysiologist:  None   Chief Complaint    Joe Klein is a 58 y.o. male with a hx of combined systolic and diastolic heart failure, bicuspid aortic valve s/p AVR 11/30/18, HTN with CKD III presents today for blood pressure and medication management after recent ED visit.   Past Medical History    Past Medical History:  Diagnosis Date  . AAA (abdominal aortic aneurysm) ([place])   . Aortic stenosis   . CHF (congestive heart failure) ([place])   . Chronic kidney disease    stage 3  . Dysrhythmia    A-fib  . Hypertension    Past Surgical History:  Procedure Laterality Date  . AORTIC VALVE REPLACEMENT N/A 12/16/2018   Procedure: AORTIC VALVE REPLACEMENT (AVR);  Surgeon: [name], MD;  Location: [place];  Service: Open Heart Surgery;  Laterality: N/A;  . CARDIOVERSION N/A 11/21/2018   Procedure: CARDIOVERSION (CATH LAB);  Surgeon: Evans Lance, MD;  Location: [place] CV LAB;  Service: Cardiovascular;  Laterality: N/A;  . CLIPPING OF ATRIAL APPENDAGE Left 12/16/2018   Procedure: CLIPPING OF ATRIAL APPENDAGE;  Surgeon: [name], MD;  Location: [place];  Service: Open Heart Surgery;  Laterality: Left;  Appendage clipping with AtriCure AtriClip 50.  Marland Kitchen MANDIBLE FRACTURE SURGERY    . MAZE N/A 12/16/2018   Procedure: MAZE;  Surgeon: [name], MD;  Location: [place];  Service: Open Heart Surgery;  Laterality: N/A;  . RIGHT/LEFT HEART CATH AND CORONARY ANGIOGRAPHY N/A 11/18/2018   Procedure: RIGHT/LEFT HEART CATH AND CORONARY ANGIOGRAPHY;  Surgeon: Jettie Booze, MD;  Location: [place] CV LAB;  Service: Cardiovascular;  Laterality: N/A;  . TEE WITHOUT CARDIOVERSION N/A 12/16/2018   Procedure: TRANSESOPHAGEAL ECHOCARDIOGRAM (TEE);  Surgeon: [name], MD;  Location: [place];  Service:  Open Heart Surgery;  Laterality: N/A;  . THORACIC AORTIC ANEURYSM REPAIR N/A 12/16/2018   Procedure: THORACIC ASCENDING ANEURYSM REPAIR;  Surgeon: [name], MD;  Location: [place];  Service: Open Heart Surgery;  Laterality: N/A;  . TONSILLECTOMY     Allergies  No Known Allergies  History of Present Illness    Joe Klein is a 58 y.o. 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 Bettina Gavia 01/06/19.  His On-X valve will require INR 2.0-3.0 for the first 3 months then 1.5-2.0 in following months.   His diuretic, potassium supplement, lisinopril have been stopped due to elevated creatinine. He presents today for evaluation of HTN after visit to [facility] ED 01/20/19. Per report BP at home had been 140s. In ED BP was 236/101 and reduced to 852 systolic with no intervention. Amlodipine 5mg  given and BP improved to 778 systolic. Dischared with Amlodipine 5mg  daily. Notable labs include K 4.7, creatinine 2.30, GFR 29, troponin 0.02, proBNP 1420, AST 29, ALT 29, Hb 11.4.   Brings his arm blood pressure cuff for review. BP since ED visit 2/42/35 have been systolic 361-443. Reports compliance with Amlodipine. Has not had recurrence of SBP >200. Tells me he is not sure what cause his BP to  jump so high - no extreme work outdoors, stress, etc. tells me he did not feel any differently when his blood pressure was high-denies chest pressure, chest pain, headache.  Tells me he checked his blood pressure because he was asked to check it at least once per day by our office.  When discussing medications we discussed transition from metoprolol to carvedilol.  He tells me he was taken off carvedilol at one point but was not sure why.   Does not remember having a negative reaction to it.  Tells me he takes his carvedilol but switch to labetalol-we discussed that this change was likely previously made due to elevated blood pressure.  After long discussion he prefers to use labetalol and we will trial this to see if it offers better control of his blood pressure.  Antihypertensive therapy is limited by his CKD-no room for ACE/ARB/diuretic.   EKGs/Labs/Other Studies Reviewed:   The following studies were reviewed today:   Cardiothoracic surgery 11/30/18 Linden Dolin, MD         Procedure Laterality Anesthesia  AORTIC VALVE REPLACEMENT (AVR) 23 mm On-X mechanical valve N/A General  THORACIC ASCENDING ANEURYSM REPAIR 28 mm Hemashield platinum dacryon proximally and a distal hemi-arch reconstruction  N/A General  MAZE N/A General  TRANSESOPHAGEAL ECHOCARDIOGRAM (TEE) N/A General  CLIPPING OF ATRIAL APPENDAGE Left General  Appendage clipping with AtriCure AtriClip 50.         EKG: No EKG today.   Recent Labs: 12/17/2018: Magnesium 2.6 01/06/2019: ALT 35; Hemoglobin 12.4; NT-Pro BNP 1,316; Platelets 605; TSH 2.330 01/17/2019: BUN 34; Creatinine, Ser 2.08; Potassium 4.6; Sodium 138  Recent Lipid Panel No results found for: CHOL, TRIG, HDL, CHOLHDL, VLDL, LDLCALC, LDLDIRECT  Home Medications   Current Meds  Medication Sig  . amiodarone (PACERONE) 200 MG tablet Take 1 tablet (200 mg total) by mouth daily.  Marland Kitchen amLODipine (NORVASC) 5 MG tablet Take 5 mg by mouth daily.  Marland Kitchen aspirin 81 MG EC tablet Take 81 mg by mouth daily.   Marland Kitchen atorvastatin (LIPITOR) 40 MG tablet Take 40 mg by mouth daily.  . furosemide (LASIX) 20 MG tablet Take 1 tablet daily as needed if weight is 172 pounds or greater.  . warfarin (COUMADIN) 2.5 MG tablet Take 1 tablet (2.5 mg total) by mouth as directed.  . [DISCONTINUED] amiodarone (PACERONE) 200 MG tablet Take 200 mg by mouth.  . [DISCONTINUED] metoprolol tartrate (LOPRESSOR) 50 MG tablet Take 1  tablet (50 mg total) by mouth 2 (two) times daily.     Review of Systems    Review of Systems  Constitution: Negative for chills, fever and malaise/fatigue.  Cardiovascular: Negative for chest pain, dyspnea on exertion, irregular heartbeat, leg swelling, near-syncope and palpitations.  Respiratory: Negative for cough, shortness of breath and wheezing.   Gastrointestinal: Negative for melena, nausea and vomiting.  Genitourinary: Negative for hematuria.  Neurological: Negative for dizziness, light-headedness and weakness.   All other systems reviewed and are otherwise negative except as noted above.  Physical Exam    VS:  BP 132/62 (BP Location: Left Arm, Patient Position: Sitting, Cuff Size: Normal)   Pulse (!) 48   Ht 6' (1.829 m)   Wt 173 lb (78.5 kg)   SpO2 100%   BMI 23.46 kg/m  , BMI Body mass index is 23.46 kg/m. GEN: Well nourished, well developed, in no acute distress. HEENT: normal. Neck: Supple, no JVD, carotid bruits, or masses. Cardiac: RRR, bradycardic, no murmurs, rubs, or gallops. No  clubbing, cyanosis, edema.  Radials/DP/PT 2+ and equal bilaterally.  Respiratory:  Respirations regular and unlabored, clear to auscultation bilaterally. GI: Soft, nontender, nondistended, BS + x 4. MS: No deformity or atrophy. Skin: Warm and dry, no rash. Neuro:  Strength and sensation are intact. Psych: Normal affect.  Assessment & Plan    1. HTN -ED visit 01/20/2019 with SBP 200s, self reduced to 160, given amlodipine 5 mg with reduction to 140.  Continues to check blood pressure daily at home with arm cuff-readings SBP 140-170.  Room for further up titration of antihypertensive therapy.  Limited by CKD and will avoid ACE/ARB/diuretic (Lisinopril previously discontinued).  We will plan to stop Metoprolol and start Labetolol 100mg  BID.   2. S/p AVR with mechanical On-X valve- Severe AS with bicuspid valve s/p AVR 11/30/18. Surgical sites healing appropriately.  Walking regularly for  exercise.   3. Chronic anticoagulation -second to AVR with On-X valve and hx of atrial fibrillation.  Follows with Coumadin clinic.  INR 2.0-3.0 for the first 3 months then 1.5-2.0 in following months.   4. Atrial fibrillation - Noted during hospitalization in setting of severe AS. Subsequent MAZE procedure 11/30/18. Anticoagulated on warfarin. Denies palpitations, irregular heart beats. Per previous note by Dr. 12/02/18 his amiodarone will likely be stopped at next office visit if he remains in NSR.  5. Bradycardia - Reports normal resting HR in 50s. HR has intermittently been in the high 40s over last few weeks. Denies dizziness, lightheadedness, syncope, pre-syncope. He exercises regularly. As he is asymptomatic, continue to monitor with careful monitoring of beta blockers.   Disposition: Follow up in 3 week(s) with Dr. Dulce Sellar.    Dulce Sellar, NP 01/25/2019, 5:19 PM

## 2019-01-25 ENCOUNTER — Ambulatory Visit (INDEPENDENT_AMBULATORY_CARE_PROVIDER_SITE_OTHER): Payer: Self-pay | Admitting: Family

## 2019-01-25 ENCOUNTER — Encounter: Payer: Self-pay | Admitting: Family

## 2019-01-25 ENCOUNTER — Telehealth: Payer: Self-pay | Admitting: Family

## 2019-01-25 VITALS — BP 132/62 | HR 48 | Ht 72.0 in | Wt 173.0 lb

## 2019-01-25 DIAGNOSIS — I4891 Unspecified atrial fibrillation: Secondary | ICD-10-CM

## 2019-01-25 DIAGNOSIS — I10 Essential (primary) hypertension: Secondary | ICD-10-CM

## 2019-01-25 DIAGNOSIS — Z7901 Long term (current) use of anticoagulants: Secondary | ICD-10-CM

## 2019-01-25 DIAGNOSIS — Z952 Presence of prosthetic heart valve: Secondary | ICD-10-CM

## 2019-01-25 MED ORDER — LABETALOL HCL 100 MG PO TABS: 100.0000 mg | ORAL_TABLET | Freq: Two times a day (BID) | ORAL | Status: DC

## 2019-01-25 MED ORDER — LABETALOL HCL 100 MG PO TABS: 100.0000 mg | ORAL_TABLET | Freq: Two times a day (BID) | ORAL | 0 refills | Status: DC

## 2019-01-25 NOTE — Patient Instructions (Signed)
Medication Instructions:  Your physician has recommended you make the following change in your medication:   STOP Metoprolol  START Labetolol 100mg  twice daily.   If you need a refill on your cardiac medications before your next appointment, please call your pharmacy.   Lab work: None today.  If you have labs (blood work) drawn today and your tests are completely normal, you will receive your results only by: Marland Kitchen MyChart Message (if you have MyChart) OR . A paper copy in the mail If you have any lab test that is abnormal or we need to change your treatment, we will call you to review the results.  Testing/Procedures: No testing ordered today.   Follow-Up: At [facility], you and your health needs are our priority.  As part of our continuing mission to provide you with exceptional heart care, we have created designated Provider Care Teams.  These Care Teams include your primary Cardiologist (physician) and Advanced Practice Providers (APPs -  Physician Assistants and Nurse Practitioners) who all work together to provide you with the care you need, when you need it. You will need a follow up appointment in 1 months.  You may see Shirlee More, MD or another member of our [place] Provider Team in [place]: Jenne Campus, MD . Jyl Heinz, MD  Any Other Special Instructions Will Be Listed Below (If Applicable).  Check your blood pressure daily at the same time. Bring your blood pressure cuff to your next office visit.   Look up "sheet pan meals" for some easy baked recipes.

## 2019-01-25 NOTE — Telephone Encounter (Signed)
Labetolol 100mg  (one tablet) BID #[id number] sent to [facility].   Left voicemail per DPR to let him know prescription was sent.   Loel Dubonnet, NP

## 2019-01-25 NOTE — Telephone Encounter (Signed)
Patient is asking for you to call his script into [facility] on [address] in [place] for 100mg  Labetalol

## 2019-02-07 ENCOUNTER — Other Ambulatory Visit: Payer: Self-pay

## 2019-02-07 ENCOUNTER — Ambulatory Visit (INDEPENDENT_AMBULATORY_CARE_PROVIDER_SITE_OTHER): Payer: Self-pay | Admitting: Cardiology

## 2019-02-07 DIAGNOSIS — Q231 Congenital insufficiency of aortic valve: Secondary | ICD-10-CM

## 2019-02-07 DIAGNOSIS — Z5181 Encounter for therapeutic drug level monitoring: Secondary | ICD-10-CM

## 2019-02-07 DIAGNOSIS — I4891 Unspecified atrial fibrillation: Secondary | ICD-10-CM

## 2019-02-07 DIAGNOSIS — Z7901 Long term (current) use of anticoagulants: Secondary | ICD-10-CM

## 2019-02-07 LAB — POCT INR: INR: 2 (ref 2.0–3.0)

## 2019-02-07 NOTE — Patient Instructions (Signed)
Continue 2.5mg  daily except 1.25mg  on Sundays, Tuesdays, and Thursdays. Recheck in 3 weeks.  Call Coumadin Clinic with any medicaiton changes or up coming procedures. Coumadin Clinic # [phone] 3 months from 12/26/2018: Clarify if pt needs INR of 1.5-2.0 or 2.0-3.0, hx of onyx valve and afib

## 2019-02-17 ENCOUNTER — Ambulatory Visit (INDEPENDENT_AMBULATORY_CARE_PROVIDER_SITE_OTHER): Payer: Self-pay | Admitting: Cardiology

## 2019-02-17 ENCOUNTER — Encounter: Payer: Self-pay | Admitting: Cardiology

## 2019-02-17 ENCOUNTER — Other Ambulatory Visit: Payer: Self-pay

## 2019-02-17 VITALS — BP 118/60 | HR 49 | Ht 72.0 in | Wt 176.0 lb

## 2019-02-17 DIAGNOSIS — N189 Chronic kidney disease, unspecified: Secondary | ICD-10-CM

## 2019-02-17 DIAGNOSIS — Z952 Presence of prosthetic heart valve: Secondary | ICD-10-CM

## 2019-02-17 DIAGNOSIS — Q2381 Bicuspid aortic valve: Secondary | ICD-10-CM

## 2019-02-17 DIAGNOSIS — N183 Chronic kidney disease, stage 3 unspecified: Secondary | ICD-10-CM

## 2019-02-17 DIAGNOSIS — I4891 Unspecified atrial fibrillation: Secondary | ICD-10-CM

## 2019-02-17 DIAGNOSIS — I10 Essential (primary) hypertension: Secondary | ICD-10-CM

## 2019-02-17 DIAGNOSIS — Q231 Congenital insufficiency of aortic valve: Secondary | ICD-10-CM

## 2019-02-17 MED ORDER — AMLODIPINE BESYLATE 5 MG PO TABS: 5.0000 mg | ORAL_TABLET | Freq: Every day | ORAL | 0 refills | Status: DC

## 2019-02-17 MED ORDER — HYDRALAZINE HCL 25 MG PO TABS: 25.0000 mg | ORAL_TABLET | Freq: Two times a day (BID) | ORAL | 0 refills | Status: DC

## 2019-02-17 NOTE — Progress Notes (Signed)
Cardiology Office Note:    Date:  02/17/2019   ID:  Joe Klein, DOB 1960-09-12, MRN [id number]  PCP:  Townsend Roger, MD  Cardiologist:  Shirlee More, MD  Electrophysiologist:  None   Referring MD: Nona Dell, Corene Cornea, MD   Chief Complaint  Patient presents with   Follow-up    History of Present Illness:    Joe Klein is a 58 y.o. 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 Bettina Gavia 01/06/19 and then in the interim did see Laurann Montana, NP on January 25, 2019.  During his last visit has metoprolol.  He was started on labetalol 100 mg twice daily.  In interim his amlodipine was increased to 10 mg by his PCP.  But this has given him tremendous amount of bilateral leg edema.  As a result of bilateral leg edema he has increased his weight needing to take his Lasix 40 mg daily.  No other complaints at this time.  Past Medical History:  Diagnosis Date   AAA (abdominal aortic aneurysm) (HCC)    Aortic stenosis    CHF (congestive heart failure) (HCC)    Chronic kidney disease    stage 3   Dysrhythmia    A-fib   Hypertension     Past Surgical History:  Procedure Laterality Date   AORTIC VALVE REPLACEMENT N/A 12/16/2018   Procedure: AORTIC VALVE REPLACEMENT (AVR);  Surgeon: [name], MD;  Location: [facility];  Service: Open Heart Surgery;  Laterality: N/A;   CARDIOVERSION N/A 11/21/2018   Procedure: CARDIOVERSION (CATH LAB);  Surgeon: Evans Lance, MD;  Location: [facility];  Service: Cardiovascular;  Laterality: N/A;   CLIPPING OF ATRIAL APPENDAGE Left 12/16/2018   Procedure: CLIPPING OF ATRIAL APPENDAGE;  Surgeon: [name], MD;  Location: [place];  Service: Open Heart  Surgery;  Laterality: Left;  Appendage clipping with AtriCure AtriClip 50.   MANDIBLE FRACTURE SURGERY     MAZE N/A 12/16/2018   Procedure: MAZE;  Surgeon: [name], MD;  Location: [facility];  Service: Open Heart Surgery;  Laterality: N/A;   RIGHT/LEFT HEART CATH AND CORONARY ANGIOGRAPHY N/A 11/18/2018   Procedure: RIGHT/LEFT HEART CATH AND CORONARY ANGIOGRAPHY;  Surgeon: Jettie Booze, MD;  Location: [facility];  Service: Cardiovascular;  Laterality: N/A;   TEE WITHOUT CARDIOVERSION N/A 12/16/2018   Procedure: TRANSESOPHAGEAL ECHOCARDIOGRAM (TEE);  Surgeon: [name], MD;  Location: [facility];  Service: Open Heart Surgery;  Laterality: N/A;   THORACIC AORTIC ANEURYSM REPAIR N/A 12/16/2018   Procedure: THORACIC ASCENDING ANEURYSM REPAIR;  Surgeon: [name], MD;  Location: [place];  Service: Open Heart Surgery;  Laterality: N/A;   TONSILLECTOMY      Current Medications: Current Meds  Medication Sig   amiodarone (PACERONE) 200 MG tablet Take 1 tablet (200 mg total) by mouth daily.   amLODipine (NORVASC) 5 MG tablet Take 1 tablet (5 mg total) by mouth daily.   aspirin 81 MG EC tablet Take 81 mg by mouth daily.    atorvastatin (LIPITOR) 40 MG tablet Take 40 mg by mouth daily.   furosemide (LASIX) 20 MG tablet Take 1 tablet daily as needed if weight is 172 pounds or greater.   labetalol (  NORMODYNE) 100 MG tablet Take 1 tablet (100 mg total) by mouth 2 (two) times daily.   warfarin (COUMADIN) 2.5 MG tablet Take 1 tablet (2.5 mg total) by mouth as directed.   [DISCONTINUED] amLODipine (NORVASC) 10 MG tablet Take 10 mg by mouth daily.     Allergies:   Patient has no known allergies.   Social History   Socioeconomic History   Marital status: Single    Spouse name: Not on file   Number of children: Not on file   Years of education: Not on file   Highest education level: Not on file  Occupational History   Not on file  Social Needs    Financial resource strain: Not on file   Food insecurity    Worry: Not on file    Inability: Not on file   Transportation needs    Medical: Not on file    Non-medical: Not on file  Tobacco Use   Smoking status: Never Smoker   Smokeless tobacco: Never Used  Substance and Sexual Activity   Alcohol use: Never    Frequency: Never   Drug use: Never   Sexual activity: Not on file  Lifestyle   Physical activity    Days per week: Not on file    Minutes per session: Not on file   Stress: Not on file  Relationships   Social connections    Talks on phone: Not on file    Gets together: Not on file    Attends religious service: Not on file    Active member of club or organization: Not on file    Attends meetings of clubs or organizations: Not on file    Relationship status: Not on file  Other Topics Concern   Not on file  Social History Narrative   Not on file     Family History: The patient's family history includes Cancer in his father; Heart disease in his mother; Hypertension in his brother and mother.  ROS:   Review of Systems  Constitution: Negative for decreased appetite, fever and weight gain.  HENT: Negative for congestion, ear discharge, hoarse voice and sore throat.   Eyes: Negative for discharge, redness, vision loss in right eye and visual halos.  Cardiovascular: Reports leg swelling.  Negative for chest pain, dyspnea on exertion, orthopnea and palpitations.   Respiratory: Negative for cough, hemoptysis, shortness of breath and snoring.   Endocrine: Negative for heat intolerance and polyphagia.  Hematologic/Lymphatic: Negative for bleeding problem. Does not bruise/bleed easily.  Skin: Negative for flushing, nail changes, rash and suspicious lesions.  Musculoskeletal: Negative for arthritis, joint pain, muscle cramps, myalgias, neck pain and stiffness.  Gastrointestinal: Negative for abdominal pain, bowel incontinence, diarrhea and excessive appetite.    Genitourinary: Negative for decreased libido, genital sores and incomplete emptying.  Neurological: Negative for brief paralysis, focal weakness, headaches and loss of balance.  Psychiatric/Behavioral: Negative for altered mental status, depression and suicidal ideas.  Allergic/Immunologic: Negative for HIV exposure and persistent infections.    EKGs/Labs/Other Studies Reviewed:    The following studies were reviewed today:   EKG: None today  Recent Labs: 12/17/2018: Magnesium 2.6 01/06/2019: ALT 35; Hemoglobin 12.4; NT-Pro BNP 1,316; Platelets 605; TSH 2.330 01/17/2019: BUN 34; Creatinine, Ser 2.08; Potassium 4.6; Sodium 138  Recent Lipid Panel No results found for: CHOL, TRIG, HDL, CHOLHDL, VLDL, LDLCALC, LDLDIRECT  Physical Exam:    VS:  BP 118/60 (BP Location: Right Arm, Patient Position: Sitting, Cuff Size: Normal)  Pulse (!) 49    Ht 6' (1.829 m)    Wt 176 lb (79.8 kg)    SpO2 97%    BMI 23.87 kg/m     Wt Readings from Last 3 Encounters:  02/17/19 176 lb (79.8 kg)  01/25/19 173 lb (78.5 kg)  01/06/19 170 lb 12.8 oz (77.5 kg)     GEN: Well nourished, well developed in no acute distress HEENT: Normal NECK: No JVD; No carotid bruits LYMPHATICS: No lymphadenopathy CARDIAC: S1S2 noted,RRR, mechanical click with systolic murmurs, rubs, gallops RESPIRATORY:  Clear to auscultation without rales, wheezing or rhonchi  ABDOMEN: Soft, non-tender, non-distended, +bowel sounds, no guarding. EXTREMITIES: Bilateral pitting edema +1., No cyanosis, no clubbing MUSCULOSKELETAL: No deformity  SKIN: Warm and dry NEUROLOGIC:  Alert and oriented x 3, non-focal PSYCHIATRIC:  Normal affect, good insight  ASSESSMENT:    1. Stage 3 chronic kidney disease, unspecified whether stage 3a or 3b CKD   2. Chronic kidney disease, unspecified CKD stage   3. Atrial fibrillation, unspecified type (HCC)   4. Bicuspid aortic valve   5. S/P aortic valve replacement    PLAN:    In order of problems  listed above:   I have decreased his amlodipine from 10 mg to 5 mg daily due to significant leg edema on increased dosing.  He will also be started on hydralazine 25 mg every 12 hours.  Of educated patient about this new medication.  He will continue to take his amiodarone, aspirin 81 mg daily, labetalol 100 mg twice daily.  Continue to be maintained on the warfarin, which now his INR should be 1.5-2.  Of note he previously had his left atrial appendage occlusion.   Patient had a lot of questions days from information sent to him about the On-X valve.  All of these were answered to his satisfaction.  Blood work will be done today which include BMP to assess his kidney function.    The patient is in agreement with the above plan. The patient left the office in stable condition.  The patient will follow up in 1 month with Dr. Dulce Sellar.   Medication Adjustments/Labs and Tests Ordered: Current medicines are reviewed at length with the patient today.  Concerns regarding medicines are outlined above.  Orders Placed This Encounter  Procedures   Basic Metabolic Panel (BMET)   Magnesium   Meds ordered this encounter  Medications   DISCONTD: hydrALAZINE (APRESOLINE) 25 MG tablet    Sig: Take 1 tablet (25 mg total) by mouth 2 (two) times daily.    Dispense:  60 tablet    Refill:  0   hydrALAZINE (APRESOLINE) 25 MG tablet    Sig: Take 1 tablet (25 mg total) by mouth 2 (two) times daily.    Dispense:  180 tablet    Refill:  0   amLODipine (NORVASC) 5 MG tablet    Sig: Take 1 tablet (5 mg total) by mouth daily.    Dispense:  90 tablet    Refill:  0    Patient Instructions  Medication Instructions:  Your physician has recommended you make the following change in your medication:  DECREASE amlodipine (norvasc) 5 mg: Take 1 tablet daily  START hydralazine (apresoline) 25 mg: Take 1 tablet twice daily  *If you need a refill on your cardiac medications before your next appointment, please  call your pharmacy*  Lab Work: Your physician recommends that you return for lab work today: BMP, Magnesium level.   If you  have labs (blood work) drawn today and your tests are completely normal, you will receive your results only by:  MyChart Message (if you have MyChart) OR  A paper copy in the mail If you have any lab test that is abnormal or we need to change your treatment, we will call you to review the results.  Testing/Procedures: None  Follow-Up: At [facility], you and your health needs are our priority.  As part of our continuing mission to provide you with exceptional heart care, we have created designated Provider Care Teams.  These Care Teams include your primary Cardiologist (physician) and Advanced Practice Providers (APPs -  Physician Assistants and Nurse Practitioners) who all work together to provide you with the care you need, when you need it.  Your next appointment:   1 month  The format for your next appointment:   In Person  Provider:   Thomasene Ripple, DO   Hydralazine tablets What is this medicine? HYDRALAZINE (hye DRAL a zeen) is a type of vasodilator. It relaxes blood vessels, increasing the blood and oxygen supply to your heart. This medicine is used to treat high blood pressure. This medicine may be used for other purposes; ask your health care provider or pharmacist if you have questions. COMMON BRAND NAME(S): Apresoline What should I tell my health care provider before I take this medicine? They need to know if you have any of these conditions:  blood vessel disease  heart disease including angina or history of heart attack  kidney or liver disease  systemic lupus erythematosus (SLE)  an unusual or allergic reaction to hydralazine, tartrazine dye, other medicines, foods, dyes, or preservatives  pregnant or trying to get pregnant  breast-feeding How should I use this medicine? Take this medicine by mouth with a glass of water. Follow  the directions on the prescription label. Take your doses at regular intervals. Do not take your medicine more often than directed. Do not stop taking except on the advice of your doctor or health care professional. Talk to your pediatrician regarding the use of this medicine in children. Special care may be needed. While this drug may be prescribed for children for selected conditions, precautions do apply. Overdosage: If you think you have taken too much of this medicine contact a poison control center or emergency room at once. NOTE: This medicine is only for you. Do not share this medicine with others. What if I miss a dose? If you miss a dose, take it as soon as you can. If it is almost time for your next dose, take only that dose. Do not take double or extra doses. What may interact with this medicine?  medicines for high blood pressure  medicines for mental depression This list may not describe all possible interactions. Give your health care provider a list of all the medicines, herbs, non-prescription drugs, or dietary supplements you use. Also tell them if you smoke, drink alcohol, or use illegal drugs. Some items may interact with your medicine. What should I watch for while using this medicine? Visit your doctor or health care professional for regular checks on your progress. Check your blood pressure and pulse rate regularly. Ask your doctor or health care professional what your blood pressure and pulse rate should be and when you should contact him or her. You may get drowsy or dizzy. Do not drive, use machinery, or do anything that needs mental alertness until you know how this medicine affects you. Do not stand or  sit up quickly, especially if you are an older patient. This reduces the risk of dizzy or fainting spells. Alcohol may interfere with the effect of this medicine. Avoid alcoholic drinks. Do not treat yourself for coughs, colds, or pain while you are taking this medicine  without asking your doctor or health care professional for advice. Some ingredients may increase your blood pressure. What side effects may I notice from receiving this medicine? Side effects that you should report to your doctor or health care professional as soon as possible:  chest pain, or fast or irregular heartbeat  fever, chills, or sore throat  numbness or tingling in the hands or feet  shortness of breath  skin rash, redness, blisters or itching  stiff or swollen joints  sudden weight gain  swelling of the feet or legs  swollen lymph glands  unusual weakness Side effects that usually do not require medical attention (report to your doctor or health care professional if they continue or are bothersome):  diarrhea, or constipation  headache  loss of appetite  nausea, vomiting This list may not describe all possible side effects. Call your doctor for medical advice about side effects. You may report side effects to FDA at 1-800-FDA-1088. Where should I keep my medicine? Keep out of the reach of children. Store at room temperature between 15 and 30 degrees C (59 and 86 degrees F). Throw away any unused medicine after the expiration date. NOTE: This sheet is a summary. It may not cover all possible information. If you have questions about this medicine, talk to your doctor, pharmacist, or health care provider.  2020 Elsevier/Gold Standard (2007-09-02 15:44:58)      Adopting a Healthy Lifestyle.  Know what a healthy weight is for you (roughly BMI <25) and aim to maintain this   Aim for 7+ servings of fruits and vegetables daily   65-80+ fluid ounces of water or unsweet tea for healthy kidneys   Limit to max 1 drink of alcohol per day; avoid smoking/tobacco   Limit animal fats in diet for cholesterol and heart health - choose grass fed whenever available   Avoid highly processed foods, and foods high in saturated/trans fats   Aim for low stress - take time  to unwind and care for your mental health   Aim for 150 min of moderate intensity exercise weekly for heart health, and weights twice weekly for bone health   Aim for 7-9 hours of sleep daily   When it comes to diets, agreement about the perfect plan isnt easy to find, even among the experts. Experts at the [facility] [employer] developed an idea known as the Healthy Eating Plate. Just imagine a plate divided into logical, healthy portions.   The emphasis is on diet quality:   Load up on vegetables and fruits - one-half of your plate: Aim for color and variety, and remember that potatoes dont count.   Go for whole grains - one-quarter of your plate: Whole wheat, barley, wheat berries, quinoa, oats, brown rice, and foods made with them. If you want pasta, go with whole wheat pasta.   Protein power - one-quarter of your plate: Fish, chicken, beans, and nuts are all healthy, versatile protein sources. Limit red meat.   The diet, however, does go beyond the plate, offering a few other suggestions.   Use healthy plant oils, such as olive, canola, soy, corn, sunflower and peanut. Check the labels, and avoid partially hydrogenated oil, which have unhealthy  trans fats.   If youre thirsty, drink water. Coffee and tea are good in moderation, but skip sugary drinks and limit milk and dairy products to one or two daily servings.   The type of carbohydrate in the diet is more important than the amount. Some sources of carbohydrates, such as vegetables, fruits, whole grains, and beans-are healthier than others.   Finally, stay active  Signed, Thomasene RippleKardie Shanel Prazak, DO  02/17/2019 1:30 PM    [facility]

## 2019-02-17 NOTE — Patient Instructions (Signed)
Medication Instructions:  Your physician has recommended you make the following change in your medication:  DECREASE amlodipine (norvasc) 5 mg: Take 1 tablet daily  START hydralazine (apresoline) 25 mg: Take 1 tablet twice daily  *If you need a refill on your cardiac medications before your next appointment, please call your pharmacy*  Lab Work: Your physician recommends that you return for lab work today: BMP, Magnesium level.   If you have labs (blood work) drawn today and your tests are completely normal, you will receive your results only by: Marland Kitchen MyChart Message (if you have MyChart) OR . A paper copy in the mail If you have any lab test that is abnormal or we need to change your treatment, we will call you to review the results.  Testing/Procedures: None  Follow-Up: At [facility], you and your health needs are our priority.  As part of our continuing mission to provide you with exceptional heart care, we have created designated Provider Care Teams.  These Care Teams include your primary Cardiologist (physician) and Advanced Practice Providers (APPs -  Physician Assistants and Nurse Practitioners) who all work together to provide you with the care you need, when you need it.  Your next appointment:   1 month  The format for your next appointment:   In Person  Provider:   Berniece Salines, DO   Hydralazine tablets What is this medicine? HYDRALAZINE (hye DRAL a zeen) is a type of vasodilator. It relaxes blood vessels, increasing the blood and oxygen supply to your heart. This medicine is used to treat high blood pressure. This medicine may be used for other purposes; ask your health care provider or pharmacist if you have questions. COMMON BRAND NAME(S): Apresoline What should I tell my health care provider before I take this medicine? They need to know if you have any of these conditions:  blood vessel disease  heart disease including angina or history of heart  attack  kidney or liver disease  systemic lupus erythematosus (SLE)  an unusual or allergic reaction to hydralazine, tartrazine dye, other medicines, foods, dyes, or preservatives  pregnant or trying to get pregnant  breast-feeding How should I use this medicine? Take this medicine by mouth with a glass of water. Follow the directions on the prescription label. Take your doses at regular intervals. Do not take your medicine more often than directed. Do not stop taking except on the advice of your doctor or health care professional. Talk to your pediatrician regarding the use of this medicine in children. Special care may be needed. While this drug may be prescribed for children for selected conditions, precautions do apply. Overdosage: If you think you have taken too much of this medicine contact a poison control center or emergency room at once. NOTE: This medicine is only for you. Do not share this medicine with others. What if I miss a dose? If you miss a dose, take it as soon as you can. If it is almost time for your next dose, take only that dose. Do not take double or extra doses. What may interact with this medicine?  medicines for high blood pressure  medicines for mental depression This list may not describe all possible interactions. Give your health care provider a list of all the medicines, herbs, non-prescription drugs, or dietary supplements you use. Also tell them if you smoke, drink alcohol, or use illegal drugs. Some items may interact with your medicine. What should I watch for while using this medicine? Visit  your doctor or health care professional for regular checks on your progress. Check your blood pressure and pulse rate regularly. Ask your doctor or health care professional what your blood pressure and pulse rate should be and when you should contact him or her. You may get drowsy or dizzy. Do not drive, use machinery, or do anything that needs mental alertness until  you know how this medicine affects you. Do not stand or sit up quickly, especially if you are an older patient. This reduces the risk of dizzy or fainting spells. Alcohol may interfere with the effect of this medicine. Avoid alcoholic drinks. Do not treat yourself for coughs, colds, or pain while you are taking this medicine without asking your doctor or health care professional for advice. Some ingredients may increase your blood pressure. What side effects may I notice from receiving this medicine? Side effects that you should report to your doctor or health care professional as soon as possible:  chest pain, or fast or irregular heartbeat  fever, chills, or sore throat  numbness or tingling in the hands or feet  shortness of breath  skin rash, redness, blisters or itching  stiff or swollen joints  sudden weight gain  swelling of the feet or legs  swollen lymph glands  unusual weakness Side effects that usually do not require medical attention (report to your doctor or health care professional if they continue or are bothersome):  diarrhea, or constipation  headache  loss of appetite  nausea, vomiting This list may not describe all possible side effects. Call your doctor for medical advice about side effects. You may report side effects to FDA at 1-800-FDA-1088. Where should I keep my medicine? Keep out of the reach of children. Store at room temperature between 15 and 30 degrees C (59 and 86 degrees F). Throw away any unused medicine after the expiration date. NOTE: This sheet is a summary. It may not cover all possible information. If you have questions about this medicine, talk to your doctor, pharmacist, or health care provider.  2020 Elsevier/Gold Standard (2007-09-02 15:44:58)

## 2019-02-18 LAB — BASIC METABOLIC PANEL
BUN/Creatinine Ratio: 15 (ref 9–20)
BUN: 34 mg/dL — ABNORMAL HIGH (ref 6–24)
CO2: 20 mmol/L (ref 20–29)
Calcium: 9.7 mg/dL (ref 8.7–10.2)
Chloride: 103 mmol/L (ref 96–106)
Creatinine, Ser: 2.34 mg/dL — ABNORMAL HIGH (ref 0.76–1.27)
GFR calc Af Amer: 34 mL/min/{1.73_m2} — ABNORMAL LOW (ref >59)
GFR calc non Af Amer: 29 mL/min/{1.73_m2} — ABNORMAL LOW (ref >59)
Glucose: 88 mg/dL (ref 65–99)
Potassium: 4.8 mmol/L (ref 3.5–5.2)
Sodium: 136 mmol/L (ref 134–144)

## 2019-02-18 LAB — MAGNESIUM: Magnesium: 2.2 mg/dL (ref 1.6–2.3)

## 2019-02-20 ENCOUNTER — Telehealth: Payer: Self-pay | Admitting: *Deleted

## 2019-02-20 NOTE — Telephone Encounter (Signed)
Telephone call to patient. Left message that creatinine was slightly elevated from last month. No changes at this moment and to call with any questions.

## 2019-02-20 NOTE — Telephone Encounter (Signed)
-----   Message from Berniece Salines, DO sent at 02/18/2019  9:52 AM EDT ----- Joe Klein, please let the patient know that his cr has slightly increased from a month ago.

## 2019-02-24 ENCOUNTER — Other Ambulatory Visit: Payer: Self-pay | Admitting: Cardiothoracic Surgery

## 2019-02-28 ENCOUNTER — Other Ambulatory Visit: Payer: Self-pay

## 2019-02-28 ENCOUNTER — Ambulatory Visit (INDEPENDENT_AMBULATORY_CARE_PROVIDER_SITE_OTHER): Payer: Self-pay | Admitting: *Deleted

## 2019-02-28 DIAGNOSIS — Z7901 Long term (current) use of anticoagulants: Secondary | ICD-10-CM

## 2019-02-28 DIAGNOSIS — Z5181 Encounter for therapeutic drug level monitoring: Secondary | ICD-10-CM

## 2019-02-28 DIAGNOSIS — I4891 Unspecified atrial fibrillation: Secondary | ICD-10-CM

## 2019-02-28 DIAGNOSIS — Q231 Congenital insufficiency of aortic valve: Secondary | ICD-10-CM

## 2019-02-28 LAB — POCT INR: INR: 1.9 — AB (ref 2.0–3.0)

## 2019-02-28 MED ORDER — WARFARIN SODIUM 2.5 MG PO TABS: 2.5000 mg | ORAL_TABLET | ORAL | 0 refills | Status: DC

## 2019-02-28 NOTE — Patient Instructions (Signed)
Take warfarin 2.5mg  tonight then resume 2.5mg  daily except 1.25mg  on Sundays, Tuesdays, and Thursdays. Recheck in 4 weeks.  Call [facility] with any medicaiton changes or up coming procedures. [facility] # [phone] 3 months from 12/26/2018: Clarify if pt needs INR of 1.5-2.0 or 2.0-3.0, hx of onyx valve and afib

## 2019-03-20 ENCOUNTER — Other Ambulatory Visit: Payer: Self-pay

## 2019-03-20 ENCOUNTER — Ambulatory Visit (INDEPENDENT_AMBULATORY_CARE_PROVIDER_SITE_OTHER): Payer: Medicaid Other | Admitting: Cardiology

## 2019-03-20 ENCOUNTER — Encounter: Payer: Self-pay | Admitting: Cardiology

## 2019-03-20 VITALS — BP 140/80 | HR 95 | Ht 72.0 in | Wt 178.0 lb

## 2019-03-20 DIAGNOSIS — I5042 Chronic combined systolic (congestive) and diastolic (congestive) heart failure: Secondary | ICD-10-CM

## 2019-03-20 DIAGNOSIS — I130 Hypertensive heart and chronic kidney disease with heart failure and stage 1 through stage 4 chronic kidney disease, or unspecified chronic kidney disease: Secondary | ICD-10-CM | POA: Diagnosis not present

## 2019-03-20 DIAGNOSIS — Z952 Presence of prosthetic heart valve: Secondary | ICD-10-CM

## 2019-03-20 DIAGNOSIS — N183 Chronic kidney disease, stage 3 unspecified: Secondary | ICD-10-CM | POA: Diagnosis not present

## 2019-03-20 MED ORDER — HYDRALAZINE HCL 50 MG PO TABS: 50.0000 mg | ORAL_TABLET | Freq: Two times a day (BID) | ORAL | 1 refills | Status: DC

## 2019-03-20 NOTE — Patient Instructions (Signed)
Medication Instructions:  Your physician has recommended you make the following change in your medication:   STOP: Amlodipine  INCREASE: Hydralazine to 50 mg take twice daily  *If you need a refill on your cardiac medications before your next appointment, please call your pharmacy*  Lab Work: None If you have labs (blood work) drawn today and your tests are completely normal, you will receive your results only by: Marland Kitchen MyChart Message (if you have MyChart) OR . A paper copy in the mail If you have any lab test that is abnormal or we need to change your treatment, we will call you to review the results.  Testing/Procedures: NOne  Follow-Up: At [facility], you and your health needs are our priority.  As part of our continuing mission to provide you with exceptional heart care, we have created designated Provider Care Teams.  These Care Teams include your primary Cardiologist (physician) and Advanced Practice Providers (APPs -  Physician Assistants and Nurse Practitioners) who all work together to provide you with the care you need, when you need it.  Your next appointment:   3 months  The format for your next appointment:   In Person  Provider:   Shirlee More, MD  Other Instructions

## 2019-03-20 NOTE — Progress Notes (Signed)
Cardiology Office Note:    Date:  03/20/2019   ID:  Joe Klein, DOB 07/20/1960, MRN [id number]  PCP:  Crist FatVan Eyk, Jason, MD  Cardiologist:  Norman HerrlichBrian Munley, MD  Electrophysiologist:  None   Referring MD: Crist FatVan Eyk, Jason, MD   Chief Complaint  Patient presents with  . Follow-up    History of Present Illness:    Joe Klein is a 58 y.o. 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  The patient usually follows with Dr. Dulce SellarMunley.  I did see the patient on 02/17/2019 a that time he was experiencing significant leg edema which he reported was worse since starting Amlodipine 10mg  daily. At the conclusion of the visit, I decreased his Amlodipine from 10 mg daily to 5 mg daily. He was then started on hydralazine 25 mg twice daily.   Today he is here for a follow up visit. He tells me that the leg edema is still persistent. He did not try taking any Lasix dose because his daily weight has not been 172 lbs or more.   No other complaints at this time.  Past Medical History:  Diagnosis Date  . AAA (abdominal aortic aneurysm) (HCC)   . Aortic stenosis   . CHF (congestive heart failure) (HCC)   . Chronic kidney disease    stage 3  . Dysrhythmia    A-fib  . Hypertension     Past Surgical History:  Procedure Laterality Date  . AORTIC VALVE REPLACEMENT N/A 12/16/2018   Procedure: AORTIC VALVE REPLACEMENT (AVR);  Surgeon: Linden DolinAtkins, Broadus Z, MD;  [facility];  Service: Open Heart Surgery;  Laterality: N/A;  . CARDIOVERSION N/A 11/21/2018   Procedure: CARDIOVERSION (CATH LAB);  Surgeon: Marinus Mawaylor, Gregg W, MD;  [facility];  Service: Cardiovascular;  Laterality: N/A;  . CLIPPING OF ATRIAL APPENDAGE Left 12/16/2018   Procedure:  CLIPPING OF ATRIAL APPENDAGE;  Surgeon: Linden DolinAtkins, Broadus Z, MD;  Location: [facility] OR;  Service: Open Heart Surgery;  Laterality: Left;  Appendage clipping with AtriCure AtriClip 50.  Marland Kitchen. MANDIBLE FRACTURE SURGERY    . MAZE N/A 12/16/2018   Procedure: MAZE;  Surgeon: Linden DolinAtkins, Broadus Z, MD;  [facility];  Service: Open Heart Surgery;  Laterality: N/A;  . RIGHT/LEFT HEART CATH AND CORONARY ANGIOGRAPHY N/A 11/18/2018   Procedure: RIGHT/LEFT HEART CATH AND CORONARY ANGIOGRAPHY;  Surgeon: Corky CraftsVaranasi, Jayadeep S, MD;  [facility];  Service: Cardiovascular;  Laterality: N/A;  . TEE WITHOUT CARDIOVERSION N/A 12/16/2018   Procedure: TRANSESOPHAGEAL ECHOCARDIOGRAM (TEE);  Surgeon: Linden DolinAtkins, Broadus Z, MD;  [facility];  Service: Open Heart Surgery;  Laterality: N/A;  . THORACIC AORTIC ANEURYSM REPAIR N/A 12/16/2018   Procedure: THORACIC ASCENDING ANEURYSM REPAIR;  Surgeon: Linden DolinAtkins, Broadus Z, MD;  Location: [facility] OR;  Service: Open Heart Surgery;  Laterality: N/A;  . TONSILLECTOMY      Current Medications: Current Meds  Medication Sig  . amiodarone (PACERONE) 200 MG tablet Take 1 tablet (200 mg total) by mouth daily.  Marland Kitchen. aspirin 81 MG EC tablet Take 81 mg by mouth daily.   Marland Kitchen. atorvastatin (LIPITOR) 40 MG tablet Take 40 mg by mouth daily.  . furosemide (LASIX) 20 MG tablet Take 1 tablet daily as needed if weight is 172 pounds or greater.  .Marland Kitchen  labetalol (NORMODYNE) 100 MG tablet Take 1 tablet (100 mg total) by mouth 2 (two) times daily.  Marland Kitchen warfarin (COUMADIN) 2.5 MG tablet Take 1 tablet (2.5 mg total) by mouth as directed.  . [DISCONTINUED] amLODipine (NORVASC) 5 MG tablet Take 1 tablet (5 mg total) by mouth daily.  . [DISCONTINUED] hydrALAZINE (APRESOLINE) 25 MG tablet Take 1 tablet (25 mg total) by mouth 2 (two) times daily.     Allergies:   Patient has no known allergies.   Social History   Socioeconomic History  . Marital status: Single    Spouse name: Not on file  . Number of children: Not  on file  . Years of education: Not on file  . Highest education level: Not on file  Occupational History  . Not on file  Social Needs  . Financial resource strain: Not on file  . Food insecurity    Worry: Not on file    Inability: Not on file  . Transportation needs    Medical: Not on file    Non-medical: Not on file  Tobacco Use  . Smoking status: Never Smoker  . Smokeless tobacco: Never Used  Substance and Sexual Activity  . Alcohol use: Never    Frequency: Never  . Drug use: Never  . Sexual activity: Not on file  Lifestyle  . Physical activity    Days per week: Not on file    Minutes per session: Not on file  . Stress: Not on file  Relationships  . Social Herbalist on phone: Not on file    Gets together: Not on file    Attends religious service: Not on file    Active member of club or organization: Not on file    Attends meetings of clubs or organizations: Not on file    Relationship status: Not on file  Other Topics Concern  . Not on file  Social History Narrative  . Not on file     Family History: The patient's family history includes Cancer in his father; Heart disease in his mother; Hypertension in his brother and mother.  ROS:   Review of Systems  Constitution: Negative for decreased appetite, fever and weight gain.  HENT: Negative for congestion, ear discharge, hoarse voice and sore throat.   Eyes: Negative for discharge, redness, vision loss in right eye and visual halos.  Cardiovascular:Reports leg swelling. Negative for chest pain, dyspnea on exertion, orthopnea and palpitations.  Respiratory: Negative for cough, hemoptysis, shortness of breath and snoring.   Endocrine: Negative for heat intolerance and polyphagia.  Hematologic/Lymphatic: Negative for bleeding problem. Does not bruise/bleed easily.  Skin: Negative for flushing, nail changes, rash and suspicious lesions.  Musculoskeletal: Negative for arthritis, joint pain, muscle cramps,  myalgias, neck pain and stiffness.  Gastrointestinal: Negative for abdominal pain, bowel incontinence, diarrhea and excessive appetite.  Genitourinary: Negative for decreased libido, genital sores and incomplete emptying.  Neurological: Negative for brief paralysis, focal weakness, headaches and loss of balance.  Psychiatric/Behavioral: Negative for altered mental status, depression and suicidal ideas.  Allergic/Immunologic: Negative for HIV exposure and persistent infections.    EKGs/Labs/Other Studies Reviewed:    The following studies were reviewed today:   EKG:  None today.  Recent Labs: 01/06/2019: ALT 35; Hemoglobin 12.4; NT-Pro BNP 1,316; Platelets 605; TSH 2.330 02/17/2019: BUN 34; Creatinine, Ser 2.34; Magnesium 2.2; Potassium 4.8; Sodium 136  Recent Lipid Panel No results found for: CHOL, TRIG, HDL, CHOLHDL, VLDL, LDLCALC, LDLDIRECT  Physical Exam:  VS:  BP 140/80 (BP Location: Left Arm, Patient Position: Sitting, Cuff Size: Normal)   Pulse 95   Ht 6' (1.829 m)   Wt 178 lb (80.7 kg)   SpO2 98%   BMI 24.14 kg/m     Wt Readings from Last 3 Encounters:  03/20/19 178 lb (80.7 kg)  02/17/19 176 lb (79.8 kg)  01/25/19 173 lb (78.5 kg)     GEN: Well nourished, well developed in no acute distress HEENT: Normal NECK: No JVD; No carotid bruits LYMPHATICS: No lymphadenopathy CARDIAC: S1S2 noted,RRR, mechanical click with systolic murmurs, rubs, gallops RESPIRATORY:  Clear to auscultation without rales, wheezing or rhonchi  ABDOMEN: Soft, non-tender, non-distended, +bowel sounds, no guarding. EXTREMITIES: bilateral leg edema +1, No cyanosis, no clubbing MUSCULOSKELETAL:  No deformity  SKIN: Warm and dry NEUROLOGIC:  Alert and oriented x 3, non-focal PSYCHIATRIC:  Normal affect, good insight  ASSESSMENT:    1. Hypertensive heart and kidney disease with chronic combined systolic and diastolic congestive heart failure and stage 3 chronic kidney disease, unspecified  whether stage 3a or 3b CKD (HCC)   2. S/P AVR (aortic valve replacement)    PLAN:     1. I will stop the Amlodipine today as this is worsening the patient's leg edema. I will like to change his hydralazine to 25 mg every 8 hours but the patient states that he will not be able to commit to a three times a day medication - with this information I will increase the hydralazine to 50 mg twice a day. In addition, his weight in the office today is 178 lbs however he notes that at home his weight was 171 lbs this morning. I have encourage his to take one dose of his Lasix 20 mg when he gets home. All of his questions were answered. He was encouraged to continue to take his blood pressure daily - his target bp is <130/30mmHG. So his new antihypertensive regimen will be hydralazine 50 mg bid and labetalol  BID.  The patient is in agreement with the above plan. The patient left the office in stable condition.  The patient will follow up in 3 months with Dr. Dulce Sellar or sooner if needed.   Medication Adjustments/Labs and Tests Ordered: Current medicines are reviewed at length with the patient today.  Concerns regarding medicines are outlined above.  No orders of the defined types were placed in this encounter.  Meds ordered this encounter  Medications  . hydrALAZINE (APRESOLINE) 50 MG tablet    Sig: Take 1 tablet (50 mg total) by mouth 2 (two) times daily.    Dispense:  180 tablet    Refill:  1    Patient Instructions  Medication Instructions:  Your physician has recommended you make the following change in your medication:   STOP: Amlodipine  INCREASE: Hydralazine to 50 mg take twice daily  *If you need a refill on your cardiac medications before your next appointment, please call your pharmacy*  Lab Work: None If you have labs (blood work) drawn today and your tests are completely normal, you will receive your results only by: Marland Kitchen MyChart Message (if you have MyChart) OR . A paper copy  in the mail If you have any lab test that is abnormal or we need to change your treatment, we will call you to review the results.  Testing/Procedures: NOne  Follow-Up: At [facility], you and your health needs are our priority.  As part of our continuing mission to provide  you with exceptional heart care, we have created designated Provider Care Teams.  These Care Teams include your primary Cardiologist (physician) and Advanced Practice Providers (APPs -  Physician Assistants and Nurse Practitioners) who all work together to provide you with the care you need, when you need it.  Your next appointment:   3 months  The format for your next appointment:   In Person  Provider:   Norman Herrlich, MD  Other Instructions      Adopting a Healthy Lifestyle.  Know what a healthy weight is for you (roughly BMI <25) and aim to maintain this   Aim for 7+ servings of fruits and vegetables daily   65-80+ fluid ounces of water or unsweet tea for healthy kidneys   Limit to max 1 drink of alcohol per day; avoid smoking/tobacco   Limit animal fats in diet for cholesterol and heart health - choose grass fed whenever available   Avoid highly processed foods, and foods high in saturated/trans fats   Aim for low stress - take time to unwind and care for your mental health   Aim for 150 min of moderate intensity exercise weekly for heart health, and weights twice weekly for bone health   Aim for 7-9 hours of sleep daily   When it comes to diets, agreement about the perfect plan isnt easy to find, even among the experts. Experts at the [facility] [employer] developed an idea known as the Healthy Eating Plate. Just imagine a plate divided into logical, healthy portions.   The emphasis is on diet quality:   Load up on vegetables and fruits - one-half of your plate: Aim for color and variety, and remember that potatoes dont count.   Go for whole grains - one-quarter of your plate:  Whole wheat, barley, wheat berries, quinoa, oats, brown rice, and foods made with them. If you want pasta, go with whole wheat pasta.   Protein power - one-quarter of your plate: Fish, chicken, beans, and nuts are all healthy, versatile protein sources. Limit red meat.   The diet, however, does go beyond the plate, offering a few other suggestions.   Use healthy plant oils, such as olive, canola, soy, corn, sunflower and peanut. Check the labels, and avoid partially hydrogenated oil, which have unhealthy trans fats.   If youre thirsty, drink water. Coffee and tea are good in moderation, but skip sugary drinks and limit milk and dairy products to one or two daily servings.   The type of carbohydrate in the diet is more important than the amount. Some sources of carbohydrates, such as vegetables, fruits, whole grains, and beans-are healthier than others.   Finally, stay active  Signed, Thomasene Ripple, DO  03/20/2019 8:54 PM    [facility]

## 2019-03-28 ENCOUNTER — Other Ambulatory Visit: Payer: Self-pay

## 2019-03-28 ENCOUNTER — Ambulatory Visit (INDEPENDENT_AMBULATORY_CARE_PROVIDER_SITE_OTHER): Payer: Medicaid Other | Admitting: *Deleted

## 2019-03-28 DIAGNOSIS — Z5181 Encounter for therapeutic drug level monitoring: Secondary | ICD-10-CM

## 2019-03-28 DIAGNOSIS — I4891 Unspecified atrial fibrillation: Secondary | ICD-10-CM

## 2019-03-28 DIAGNOSIS — Q231 Congenital insufficiency of aortic valve: Secondary | ICD-10-CM | POA: Diagnosis not present

## 2019-03-28 DIAGNOSIS — Z7901 Long term (current) use of anticoagulants: Secondary | ICD-10-CM

## 2019-03-28 LAB — POCT INR: INR: 2 (ref 2.0–3.0)

## 2019-03-28 MED ORDER — WARFARIN SODIUM 2.5 MG PO TABS: 2.5000 mg | ORAL_TABLET | ORAL | 0 refills | Status: DC

## 2019-03-28 NOTE — Patient Instructions (Signed)
Continue warfarin 2.5mg  daily except 1.25mg  on Sundays, Tuesdays and Thursdays. Recheck in 4 weeks.  Call Coumadin Clinic with any medicaiton changes or up coming procedures. [facility] # [phone] 3 months from 12/26/2018: Clarify if pt needs INR of 1.5-2.0 or 2.0-3.0, hx of onyx valve and afib

## 2019-04-11 ENCOUNTER — Telehealth: Payer: Self-pay

## 2019-04-11 NOTE — Telephone Encounter (Signed)
lmom to move appt as the [place] office will close coumadin 12/22 

## 2019-04-18 ENCOUNTER — Telehealth: Payer: Self-pay | Admitting: Cardiology

## 2019-04-18 DIAGNOSIS — I10 Essential (primary) hypertension: Secondary | ICD-10-CM

## 2019-04-18 DIAGNOSIS — I4891 Unspecified atrial fibrillation: Secondary | ICD-10-CM

## 2019-04-18 DIAGNOSIS — Z952 Presence of prosthetic heart valve: Secondary | ICD-10-CM

## 2019-04-18 MED ORDER — LABETALOL HCL 100 MG PO TABS: 100.0000 mg | ORAL_TABLET | Freq: Two times a day (BID) | ORAL | 0 refills | Status: DC

## 2019-04-18 MED ORDER — WARFARIN SODIUM 2.5 MG PO TABS: 2.5000 mg | ORAL_TABLET | ORAL | 0 refills | Status: DC

## 2019-04-18 NOTE — Telephone Encounter (Signed)
Labetalol 100 mg twice daily refilled. Coumadin refill routed to [facility].

## 2019-04-18 NOTE — Telephone Encounter (Signed)
Also wants warfrin called to [facility]??

## 2019-04-18 NOTE — Addendum Note (Signed)
Addended by: Marcelle Overlie D on: 04/18/2019 04:44 PM   Modules accepted: Orders

## 2019-04-18 NOTE — Telephone Encounter (Signed)
Call labetalol to [facility] [address]

## 2019-04-18 NOTE — Addendum Note (Signed)
Addended by: Ashok Norris on: 04/18/2019 04:26 PM   Modules accepted: Orders

## 2019-04-25 NOTE — Telephone Encounter (Signed)
This encounter was created in error - please disregard.

## 2019-04-26 ENCOUNTER — Other Ambulatory Visit: Payer: Self-pay | Admitting: Pharmacist Clinician (PhC)/ Clinical Pharmacy Specialist

## 2019-04-26 MED ORDER — WARFARIN SODIUM 1 MG PO TABS: ORAL_TABLET | ORAL | 0 refills | Status: DC

## 2019-04-26 NOTE — Telephone Encounter (Signed)
Patient complaint of warfarin tablets not breaking in half easily.  States prior generic (oval) snapped easily and evenly, but this generic (round) crumbles and is uneven.  Wanted to know options.    Will switch patient to 1 mg tablets with instructions of 2 tablets daily (currently at 13.75 mg per week).  Explained dosing and conversion to 1 mg tablets.  Patient agreeable and understands.  Will continue with his current 2.5 mg tablets until gone and then convert.  Understands that he will need to let [facility] know when he switches tablet strengths.

## 2019-05-02 ENCOUNTER — Encounter

## 2019-05-09 ENCOUNTER — Ambulatory Visit (INDEPENDENT_AMBULATORY_CARE_PROVIDER_SITE_OTHER): Payer: Medicaid Other | Admitting: *Deleted

## 2019-05-09 ENCOUNTER — Telehealth: Payer: Self-pay | Admitting: Cardiology

## 2019-05-09 ENCOUNTER — Other Ambulatory Visit: Payer: Self-pay

## 2019-05-09 DIAGNOSIS — Z5181 Encounter for therapeutic drug level monitoring: Secondary | ICD-10-CM

## 2019-05-09 DIAGNOSIS — Q231 Congenital insufficiency of aortic valve: Secondary | ICD-10-CM | POA: Diagnosis not present

## 2019-05-09 DIAGNOSIS — Z7901 Long term (current) use of anticoagulants: Secondary | ICD-10-CM | POA: Diagnosis not present

## 2019-05-09 DIAGNOSIS — I4891 Unspecified atrial fibrillation: Secondary | ICD-10-CM

## 2019-05-09 LAB — POCT INR: INR: 1.7 — AB (ref 2.0–3.0)

## 2019-05-09 MED ORDER — WARFARIN SODIUM 2.5 MG PO TABS: ORAL_TABLET | ORAL | 6 refills | Status: DC

## 2019-05-09 NOTE — Telephone Encounter (Signed)
Attempted to call patient he said he would have to call back and phone hung up. Will continue efforts.

## 2019-05-09 NOTE — Telephone Encounter (Signed)
Please can we call in his Amiodorone 100 mg to the [facility] his dose has changed  And he just needs 100mg  called in for 90 days supply.

## 2019-05-09 NOTE — Patient Instructions (Addendum)
Take coumadin 2 tablets tonight then start 1 tablet daily except none on Sundays. Having trouble with tablet crumbling when breaking in half.  Recheck in 3 weeks.  Call Coumadin Clinic with any medicaiton changes or up coming procedures. Coumadin Clinic # [phone]

## 2019-05-09 NOTE — Telephone Encounter (Signed)
Patient is in office requesting if he can take his amiodarone as 100 mg in the morning and 100 mg in the evening instead of 200 mg all at once. Will consult with Dr. Servando Salina.

## 2019-05-10 MED ORDER — AMIODARONE HCL 100 MG PO TABS: 100.0000 mg | ORAL_TABLET | Freq: Two times a day (BID) | ORAL | 1 refills | Status: DC

## 2019-05-10 NOTE — Telephone Encounter (Signed)
Called patient informed him Dr. Servando Salina approves him to take amiodarone 100 mg twice daily as long as he gets the full 200 mg dose per day. Patient verbally understood. No further questions. RX sent to patient's requested pharmacy.

## 2019-05-10 NOTE — Addendum Note (Signed)
Addended by: Lita Mains on: 05/10/2019 10:47 AM   Modules accepted: Orders

## 2019-05-30 ENCOUNTER — Ambulatory Visit (INDEPENDENT_AMBULATORY_CARE_PROVIDER_SITE_OTHER): Payer: Medicaid Other | Admitting: Pharmacist Clinician (PhC)/ Clinical Pharmacy Specialist

## 2019-05-30 ENCOUNTER — Other Ambulatory Visit: Payer: Self-pay

## 2019-05-30 DIAGNOSIS — Z7901 Long term (current) use of anticoagulants: Secondary | ICD-10-CM

## 2019-05-30 DIAGNOSIS — Z5181 Encounter for therapeutic drug level monitoring: Secondary | ICD-10-CM

## 2019-05-30 DIAGNOSIS — I4891 Unspecified atrial fibrillation: Secondary | ICD-10-CM | POA: Diagnosis not present

## 2019-05-30 DIAGNOSIS — Q231 Congenital insufficiency of aortic valve: Secondary | ICD-10-CM | POA: Diagnosis not present

## 2019-05-30 LAB — POCT INR: INR: 2.2 (ref 2.0–3.0)

## 2019-05-30 NOTE — Patient Instructions (Signed)
Continue with  1 tablet daily except none on Sundays. (Having trouble with tablet crumbling when breaking in half)  Recheck in 4 weeks.  Call Coumadin Clinic with any medicaiton changes or up coming procedures. Coumadin Clinic # [phone]

## 2019-06-22 ENCOUNTER — Other Ambulatory Visit: Payer: Self-pay

## 2019-06-22 ENCOUNTER — Telehealth (INDEPENDENT_AMBULATORY_CARE_PROVIDER_SITE_OTHER): Payer: Medicaid Other | Admitting: Cardiology

## 2019-06-22 NOTE — Progress Notes (Signed)
Appointment was canceled.

## 2019-06-27 ENCOUNTER — Ambulatory Visit (INDEPENDENT_AMBULATORY_CARE_PROVIDER_SITE_OTHER): Payer: Medicaid Other | Admitting: *Deleted

## 2019-06-27 ENCOUNTER — Other Ambulatory Visit: Payer: Self-pay

## 2019-06-27 DIAGNOSIS — Z7901 Long term (current) use of anticoagulants: Secondary | ICD-10-CM

## 2019-06-27 DIAGNOSIS — Q231 Congenital insufficiency of aortic valve: Secondary | ICD-10-CM

## 2019-06-27 DIAGNOSIS — I4891 Unspecified atrial fibrillation: Secondary | ICD-10-CM | POA: Diagnosis not present

## 2019-06-27 DIAGNOSIS — Z5181 Encounter for therapeutic drug level monitoring: Secondary | ICD-10-CM | POA: Diagnosis not present

## 2019-06-27 LAB — POCT INR: INR: 2.1 (ref 2.0–3.0)

## 2019-06-27 NOTE — Patient Instructions (Signed)
Continue warfarin 1 tablet daily except none on Sundays. (Having trouble with tablet crumbling when breaking in half)  Recheck in 4 weeks.  Call Coumadin Clinic with any medicaiton changes or up coming procedures. Coumadin Clinic # [phone]

## 2019-07-13 ENCOUNTER — Telehealth: Payer: Self-pay | Admitting: Cardiology

## 2019-07-13 NOTE — Telephone Encounter (Signed)
Probably best for him to use over-the-counter Claritin Zyrtec or the generic [facility] equivalent.  Suspect what he is showing me is Benadryl preparation best to be avoided

## 2019-07-13 NOTE — Telephone Encounter (Signed)
Please advise 

## 2019-07-13 NOTE — Telephone Encounter (Signed)
Pt c/o medication issue:  1. Name of Medication: Equate Allergy Relief 10 MG   2. How are you currently taking this medication (dosage and times per day)? Not currently taking medication   3. Are you having a reaction (difficulty breathing--STAT)? No   4. What is your medication issue? Joe Klein is calling wanting to know if it is okay for him to take the listed allergy medicine. He states he took it prior to his heart condition but wanted the okay from Dr. Dulce Sellar before taking. He states the active ingredients are Cetrizine, Hydrochloride, Antihistamine. Please advise.

## 2019-07-13 NOTE — Telephone Encounter (Signed)
Spoke with patient. INformed of Dr Hulen Shouts recommedations. Pt verbalized understanding

## 2019-07-25 ENCOUNTER — Ambulatory Visit (INDEPENDENT_AMBULATORY_CARE_PROVIDER_SITE_OTHER): Payer: Medicaid Other | Admitting: *Deleted

## 2019-07-25 ENCOUNTER — Other Ambulatory Visit: Payer: Self-pay

## 2019-07-25 DIAGNOSIS — Q231 Congenital insufficiency of aortic valve: Secondary | ICD-10-CM

## 2019-07-25 DIAGNOSIS — Z5181 Encounter for therapeutic drug level monitoring: Secondary | ICD-10-CM

## 2019-07-25 DIAGNOSIS — I4891 Unspecified atrial fibrillation: Secondary | ICD-10-CM | POA: Diagnosis not present

## 2019-07-25 DIAGNOSIS — Z7901 Long term (current) use of anticoagulants: Secondary | ICD-10-CM | POA: Diagnosis not present

## 2019-07-25 LAB — POCT INR: INR: 2.6 (ref 2.0–3.0)

## 2019-07-25 NOTE — Patient Instructions (Signed)
Continue warfarin 1 tablet daily except none on Sundays. (Having trouble with tablet crumbling when breaking in half)  Recheck in 6 weeks.  Call Coumadin Clinic with any medicaiton changes or up coming procedures. Coumadin Clinic # [phone]

## 2019-07-26 NOTE — Progress Notes (Signed)
Cardiology Office Note:    Date:  07/27/2019   ID:  Joe Klein, DOB [birth year], MRN [id number]  PCP:  Crist Fat, MD  Cardiologist:  Norman Herrlich, MD    Referring MD: Leonia Reader, Barbara Cower, MD    ASSESSMENT:    1. S/P AVR (aortic valve replacement)   2. Long term (current) use of anticoagulants   3. 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 Stage 3 chronic kidney disease, unspecified whether stage 3a or 3b CKD   5. Paroxysmal atrial fibrillation (HCC)    PLAN:    In order of problems listed above:  1. He made a remarkable recovery from his complex open heart surgery he needs a baseline echocardiogram continue warfarin managed in anticoagulant clinic. 2. No evidence of heart failure not taking a loop diuretic BP poorly controlled he will take his loop diuretic 2 days a week and start distal MRI for BP control 3. Recheck renal function today 4. Reduce amiodarone with a goal of discontinuing next visit any change in 3 months   Next appointment: 3 months   Medication Adjustments/Labs and Tests Ordered: Current medicines are reviewed at length with the patient today.  Concerns regarding medicines are outlined above.  No orders of the defined types were placed in this encounter.  No orders of the defined types were placed in this encounter.   Chief Complaint  Patient presents with  . Follow-up    After surgical aortic valve replacement and aortic root reconstruction.  . Hypertension  . Congestive Heart Failure  . Atrial Fibrillation    Single isolated episode prior to surgery    History of Present Illness:    Joe Klein is a 59 y.o. male with a hx of  heart failure, bicuspid aortic valve aneurysm ascending aorta, hypertension with chronic kidney disease stage III and combined systolic and diastolic heart  failure and  CT surgery for AS and thoracic aorta aneurysm 12/16/2018.  kins, Merri Brunette, MD      Procedure Laterality Anesthesia  AORTIC VALVE REPLACEMENT (AVR) 23 mm On-X mechanical valve N/A General  THORACIC ASCENDING ANEURYSM REPAIR 28 mm Hemashield platinum dacryon proximally and a distal hemi-arch reconstruction  N/A General  MAZE N/A General  TRANSESOPHAGEAL ECHOCARDIOGRAM (TEE) N/A General  CLIPPING OF ATRIAL APPENDAGE Left General  Appendage clipping with AtriCure AtriClip 50.       He was last seen 01/06/2019. Compliance with diet, lifestyle and medications: Yes   Ref Range & Units 6 mo ago  NT-Pro BNP 0 - 210 pg/mL 1,316High    He continues to be pleased with the quality of his life he made a complete recovery after surgery is very vigorous and active and hiking and has no exercise intolerance shortness of breath edema chest pain palpitation or syncope compliant with his anticoagulant managed in our anticoagulation clinic.  I told him to ask about home monitoring.  He is on low-dose amiodarone will decrease to 50% with the intent to stop next visit check liver function thyroid for toxicity.  Blood pressure is poorly controlled with home morning readings before Mansour[place] 1 60-1 80 I will put him on a small dose of his loop diuretic 2 days a week and start distal diuretic recheck labs including renal function potassium and have him check his blood pressure daily at rest after medications and I suspect  he will approach target.  If he does not centrally active clonidine prior minoxidil would be appropriate.  He has had no bleeding complications of his anticoagulant Past Medical History:  Diagnosis Date  . AAA (abdominal aortic aneurysm) ([place])   . Aortic stenosis   . CHF (congestive heart failure) ([place])   . Chronic kidney disease    stage 3  . Dysrhythmia    A-fib  . Hypertension     Past Surgical History:  Procedure Laterality Date  . AORTIC VALVE REPLACEMENT N/A  12/16/2018   Procedure: AORTIC VALVE REPLACEMENT (AVR);  Surgeon: [name], MD;  Location: [place];  Service: Open Heart Surgery;  Laterality: N/A;  . CARDIOVERSION N/A 11/21/2018   Procedure: CARDIOVERSION (CATH LAB);  Surgeon: Evans Lance, MD;  Location: [place] [facility];  Service: Cardiovascular;  Laterality: N/A;  . CLIPPING OF ATRIAL APPENDAGE Left 12/16/2018   Procedure: CLIPPING OF ATRIAL APPENDAGE;  Surgeon: [name], MD;  Location: [place];  Service: Open Heart Surgery;  Laterality: Left;  Appendage clipping with AtriCure AtriClip 50.  Marland Kitchen MANDIBLE FRACTURE SURGERY    . MAZE N/A 12/16/2018   Procedure: MAZE;  Surgeon: [name], MD;  Location: [place];  Service: Open Heart Surgery;  Laterality: N/A;  . RIGHT/LEFT HEART CATH AND CORONARY ANGIOGRAPHY N/A 11/18/2018   Procedure: RIGHT/LEFT HEART CATH AND CORONARY ANGIOGRAPHY;  Surgeon: Jettie Booze, MD;  Location: [place] [facility];  Service: Cardiovascular;  Laterality: N/A;  . TEE WITHOUT CARDIOVERSION N/A 12/16/2018   Procedure: TRANSESOPHAGEAL ECHOCARDIOGRAM (TEE);  Surgeon: [name], MD;  Location: [place];  Service: Open Heart Surgery;  Laterality: N/A;  . THORACIC AORTIC ANEURYSM REPAIR N/A 12/16/2018   Procedure: THORACIC ASCENDING ANEURYSM REPAIR;  Surgeon: [name], MD;  Location: [place];  Service: Open Heart Surgery;  Laterality: N/A;  . TONSILLECTOMY      Current Medications: Current Meds  Medication Sig  . amiodarone (PACERONE) 100 MG tablet Take 1 tablet (100 mg total) by mouth 2 (two) times daily.  Marland Kitchen aspirin 81 MG EC tablet Take 81 mg by mouth daily.   Marland Kitchen atorvastatin (LIPITOR) 40 MG tablet Take 40 mg by mouth daily.  . furosemide (LASIX) 20 MG tablet Take 1 tablet daily as needed if weight is 172 pounds or greater.  . hydrALAZINE (APRESOLINE) 50 MG tablet Take 1 tablet (50 mg total) by mouth 2 (two) times daily.  Marland Kitchen labetalol (NORMODYNE) 100 MG tablet Take 1 tablet (100 mg total)  by mouth 2 (two) times daily.  Marland Kitchen warfarin (COUMADIN) 2.5 MG tablet Take 1 tablet daily except none on Sundays     Allergies:   Patient has no known allergies.   Social History   Socioeconomic History  . Marital status: Single    Spouse name: Not on file  . Number of children: Not on file  . Years of education: Not on file  . Highest education level: Not on file  Occupational History  . Not on file  Tobacco Use  . Smoking status: Never Smoker  . Smokeless tobacco: Never Used  Substance and Sexual Activity  . Alcohol use: Never  . Drug use: Never  . Sexual activity: Not on file  Other Topics Concern  . Not on file  Social History Narrative  . Not on file   Social Determinants of Health   Financial Resource Strain:   . Difficulty of Paying Living Expenses:   Food Insecurity:   .  Worried About Programme researcher, broadcasting/film/video in the Last Year:   . Barista in the Last Year:   Transportation Needs:   . Freight forwarder (Medical):   Marland Kitchen Lack of Transportation (Non-Medical):   Physical Activity:   . Days of Exercise per Week:   . Minutes of Exercise per Session:   Stress:   . Feeling of Stress :   Social Connections:   . Frequency of Communication with Friends and Family:   . Frequency of Social Gatherings with Friends and Family:   . Attends Religious Services:   . Active Member of Clubs or Organizations:   . Attends Banker Meetings:   Marland Kitchen Marital Status:      Family History: The patient's family history includes Cancer in his father; Heart disease in his mother; Hypertension in his brother and mother. ROS:   Please see the history of present illness.    All other systems reviewed and are negative.  EKGs/Labs/Other Studies Reviewed:    The following studies were reviewed today:  EKG:  EKG ordered today and personally reviewed.  The ekg ordered today demonstrates sinus bradycardia 48 bpm left bundle branch block  Recent Labs: 01/06/2019: ALT 35;  Hemoglobin 12.4; NT-Pro BNP 1,316; Platelets 605; TSH 2.330 02/17/2019: BUN 34; Creatinine, Ser 2.34; Magnesium 2.2; Potassium 4.8; Sodium 136  Recent Lipid Panel No results found for: CHOL, TRIG, HDL, CHOLHDL, VLDL, LDLCALC, LDLDIRECT  Physical Exam:    VS:  BP (!) 158/70   Pulse (!) 48   Ht 6' (1.829 m)   Wt 179 lb (81.2 kg)   SpO2 97%   BMI 24.28 kg/m     Wt Readings from Last 3 Encounters:  07/27/19 179 lb (81.2 kg)  03/20/19 178 lb (80.7 kg)  02/17/19 176 lb (79.8 kg)  Repeat blood pressure by me 150/60  GEN:  Well nourished, well developed in no acute distress HEENT: Normal NECK: No JVD; No carotid bruits LYMPHATICS: No lymphadenopathy CARDIAC: Sharp closing sound 1 of 6 flow murmur no AR P2 is paradoxical left bundle branch block RRR, no , rubs, gallops RESPIRATORY:  Clear to auscultation without rales, wheezing or rhonchi  ABDOMEN: Soft, non-tender, non-distended MUSCULOSKELETAL:  No edema; No deformity  SKIN: Warm and dry NEUROLOGIC:  Alert and oriented x 3 PSYCHIATRIC:  Normal affect    Signed, Norman Herrlich, MD  07/27/2019 11:31 AM    [facility]

## 2019-07-27 ENCOUNTER — Other Ambulatory Visit: Payer: Self-pay

## 2019-07-27 ENCOUNTER — Encounter: Payer: Self-pay | Admitting: Cardiology

## 2019-07-27 ENCOUNTER — Ambulatory Visit (INDEPENDENT_AMBULATORY_CARE_PROVIDER_SITE_OTHER): Payer: Medicaid Other | Admitting: Cardiology

## 2019-07-27 VITALS — BP 158/70 | HR 48 | Ht 72.0 in | Wt 179.0 lb

## 2019-07-27 DIAGNOSIS — I480 Paroxysmal atrial fibrillation: Secondary | ICD-10-CM

## 2019-07-27 DIAGNOSIS — Z952 Presence of prosthetic heart valve: Secondary | ICD-10-CM | POA: Diagnosis not present

## 2019-07-27 DIAGNOSIS — I130 Hypertensive heart and chronic kidney disease with heart failure and stage 1 through stage 4 chronic kidney disease, or unspecified chronic kidney disease: Secondary | ICD-10-CM | POA: Diagnosis not present

## 2019-07-27 DIAGNOSIS — N183 Chronic kidney disease, stage 3 unspecified: Secondary | ICD-10-CM | POA: Diagnosis not present

## 2019-07-27 DIAGNOSIS — Z7901 Long term (current) use of anticoagulants: Secondary | ICD-10-CM

## 2019-07-27 DIAGNOSIS — I5042 Chronic combined systolic (congestive) and diastolic (congestive) heart failure: Secondary | ICD-10-CM

## 2019-07-27 DIAGNOSIS — Z79899 Other long term (current) drug therapy: Secondary | ICD-10-CM

## 2019-07-27 MED ORDER — FUROSEMIDE 20 MG PO TABS: ORAL_TABLET | ORAL | Status: DC

## 2019-07-27 MED ORDER — EPLERENONE 25 MG PO TABS: 25.0000 mg | ORAL_TABLET | Freq: Every day | ORAL | 3 refills | Status: DC

## 2019-07-27 MED ORDER — AMIODARONE HCL 100 MG PO TABS: 100.0000 mg | ORAL_TABLET | Freq: Every day | ORAL | 1 refills | Status: DC

## 2019-07-27 NOTE — Patient Instructions (Signed)
Medication Instructions:  Your physician has recommended you make the following change in your medication:  DECREASE Amiodarone to 100 mg take one tablet by mouth daily. DECREASE Furosemide take one tablet by mouth once or twice a week.  START Inspra 25 mg take one tablet by mouth daily *If you need a refill on your cardiac medications before your next appointment, please call your pharmacy*   Lab Work: Your physician recommends that you return for lab work in: TODAY CBC, CMP, TSH, T3, T4, Pro BNP If you have labs (blood work) drawn today and your tests are completely normal, you will receive your results only by: Marland Kitchen MyChart Message (if you have MyChart) OR . A paper copy in the mail If you have any lab test that is abnormal or we need to change your treatment, we will call you to review the results.   Testing/Procedures: Your physician has requested that you have an echocardiogram. Echocardiography is a painless test that uses sound waves to create images of your heart. It provides your doctor with information about the size and shape of your heart and how well your heart's chambers and valves are working. This procedure takes approximately one hour. There are no restrictions for this procedure.     Follow-Up: At [facility], you and your health needs are our priority.  As part of our continuing mission to provide you with exceptional heart care, we have created designated Provider Care Teams.  These Care Teams include your primary Cardiologist (physician) and Advanced Practice Providers (APPs -  Physician Assistants and Nurse Practitioners) who all work together to provide you with the care you need, when you need it.  We recommend signing up for the patient portal called "MyChart".  Sign up information is provided on this After Visit Summary.  MyChart is used to connect with patients for Virtual Visits (Telemedicine).  Patients are able to view lab/test results, encounter notes,  upcoming appointments, etc.  Non-urgent messages can be sent to your provider as well.   To learn more about what you can do with MyChart, go to [email or web address].    Your next appointment:   3 month(s)  The format for your next appointment:   In Person  Provider:   Norman Herrlich, MD   Other Instructions Please take your blood pressure once or twice daily.

## 2019-07-28 ENCOUNTER — Telehealth: Payer: Self-pay | Admitting: Cardiology

## 2019-07-28 LAB — COMPREHENSIVE METABOLIC PANEL
ALT: 36 IU/L (ref 0–44)
AST: 23 IU/L (ref 0–40)
Albumin/Globulin Ratio: 1.2 (ref 1.2–2.2)
Albumin: 4.1 g/dL (ref 3.8–4.9)
Alkaline Phosphatase: 57 IU/L (ref 39–117)
BUN/Creatinine Ratio: 14 (ref 10–24)
BUN: 33 mg/dL — ABNORMAL HIGH (ref 8–27)
Bilirubin Total: 0.6 mg/dL (ref 0.0–1.2)
CO2: 22 mmol/L (ref 20–29)
Calcium: 9.3 mg/dL (ref 8.6–10.2)
Chloride: 104 mmol/L (ref 96–106)
Creatinine, Ser: 2.38 mg/dL — ABNORMAL HIGH (ref 0.76–1.27)
GFR calc Af Amer: 33 mL/min/{1.73_m2} — ABNORMAL LOW (ref >59)
GFR calc non Af Amer: 29 mL/min/{1.73_m2} — ABNORMAL LOW (ref >59)
Globulin, Total: 3.4 g/dL (ref 1.5–4.5)
Glucose: 118 mg/dL — ABNORMAL HIGH (ref 65–99)
Potassium: 4.8 mmol/L (ref 3.5–5.2)
Sodium: 138 mmol/L (ref 134–144)
Total Protein: 7.5 g/dL (ref 6.0–8.5)

## 2019-07-28 LAB — CBC
Hematocrit: 38.4 % (ref 37.5–51.0)
Hemoglobin: 12.4 g/dL — ABNORMAL LOW (ref 13.0–17.7)
MCH: 28.8 pg (ref 26.6–33.0)
MCHC: 32.3 g/dL (ref 31.5–35.7)
MCV: 89 fL (ref 79–97)
Platelets: 218 10*3/uL (ref 150–450)
RBC: 4.31 x10E6/uL (ref 4.14–5.80)
RDW: 14 % (ref 11.6–15.4)
WBC: 7.7 10*3/uL (ref 3.4–10.8)

## 2019-07-28 LAB — TSH: TSH: 1.58 u[IU]/mL (ref 0.450–4.500)

## 2019-07-28 LAB — T3: T3, Total: 59 ng/dL — ABNORMAL LOW (ref 71–180)

## 2019-07-28 LAB — PRO B NATRIURETIC PEPTIDE: NT-Pro BNP: 1276 pg/mL — ABNORMAL HIGH (ref 0–210)

## 2019-07-28 LAB — T4: T4, Total: 7.9 ug/dL (ref 4.5–12.0)

## 2019-07-28 NOTE — Telephone Encounter (Signed)
Patient returning call about lab results. He states he got the message and does not need a call back.

## 2019-08-07 ENCOUNTER — Other Ambulatory Visit: Payer: Self-pay | Admitting: Cardiology

## 2019-08-07 NOTE — Telephone Encounter (Signed)
*  STAT* If patient is at the pharmacy, call can be transferred to refill team.   1. Which medications need to be refilled? (please list name of each medication and dose if known)  hydrALAZINE (APRESOLINE) 50 MG tablet   2. Which pharmacy/location (including street and city if local pharmacy) is medication to be sent to?  [facility] [place], [place] - [address], [place] [address]  3. Do they need a 30 day or 90 day supply? 90    Pt said Dr. Servando Salina increased the dosage to 75 mg, and the pt has been cutting the tablets in half to make the new dose. He has about 4 days left of medication before he will run out

## 2019-08-08 MED ORDER — HYDRALAZINE HCL 50 MG PO TABS: 50.0000 mg | ORAL_TABLET | Freq: Two times a day (BID) | ORAL | 1 refills | Status: DC

## 2019-08-08 NOTE — Telephone Encounter (Signed)
Rx refill sent to pharmacy. 

## 2019-08-12 ENCOUNTER — Other Ambulatory Visit: Payer: Self-pay | Admitting: Cardiology

## 2019-08-12 MED ORDER — HYDRALAZINE HCL 50 MG PO TABS: 75.0000 mg | ORAL_TABLET | Freq: Two times a day (BID) | ORAL | 1 refills | Status: DC

## 2019-08-31 ENCOUNTER — Other Ambulatory Visit: Payer: Self-pay

## 2019-08-31 ENCOUNTER — Telehealth: Payer: Self-pay

## 2019-08-31 ENCOUNTER — Ambulatory Visit (INDEPENDENT_AMBULATORY_CARE_PROVIDER_SITE_OTHER): Payer: Medicaid Other

## 2019-08-31 DIAGNOSIS — Z952 Presence of prosthetic heart valve: Secondary | ICD-10-CM

## 2019-08-31 NOTE — Progress Notes (Signed)
Complete echocardiogram has been performed.  Jimmy Sherman Lipuma RDCS, RVT 

## 2019-08-31 NOTE — Telephone Encounter (Signed)
Spoke with patient regarding results.  Patient verbalizes understanding and is agreeable to plan of care. Advised patient to call back with any issues or concerns.  

## 2019-08-31 NOTE — Telephone Encounter (Signed)
-----   Message from Baldo Daub, MD sent at 08/31/2019  1:52 PM EDT ----- Normal or stable result  Results are very good heart muscle function strong normal valve function normal.

## 2019-09-05 ENCOUNTER — Ambulatory Visit (INDEPENDENT_AMBULATORY_CARE_PROVIDER_SITE_OTHER): Payer: Medicaid Other | Admitting: *Deleted

## 2019-09-05 ENCOUNTER — Other Ambulatory Visit: Payer: Self-pay

## 2019-09-05 DIAGNOSIS — Q231 Congenital insufficiency of aortic valve: Secondary | ICD-10-CM | POA: Diagnosis not present

## 2019-09-05 DIAGNOSIS — I4891 Unspecified atrial fibrillation: Secondary | ICD-10-CM

## 2019-09-05 DIAGNOSIS — Z7901 Long term (current) use of anticoagulants: Secondary | ICD-10-CM

## 2019-09-05 DIAGNOSIS — Q2381 Bicuspid aortic valve: Secondary | ICD-10-CM

## 2019-09-05 DIAGNOSIS — Z5181 Encounter for therapeutic drug level monitoring: Secondary | ICD-10-CM

## 2019-09-05 LAB — POCT INR: INR: 2.3 (ref 2.0–3.0)

## 2019-09-05 NOTE — Patient Instructions (Signed)
Continue warfarin 1 tablet daily except none on Sundays. (Having trouble with tablet crumbling when breaking in half) Amiodarone was decreased to 100mg daily on 07/27/19  Recheck in 6 weeks.  Call Coumadin Clinic with any medicaiton changes or up coming procedures. Coumadin Clinic # [phone] 

## 2019-09-08 ENCOUNTER — Other Ambulatory Visit: Payer: Self-pay

## 2019-09-08 DIAGNOSIS — Z952 Presence of prosthetic heart valve: Secondary | ICD-10-CM

## 2019-09-08 DIAGNOSIS — I10 Essential (primary) hypertension: Secondary | ICD-10-CM

## 2019-09-11 NOTE — Telephone Encounter (Signed)
Not a Georgie Chard patient, this is a Corporate investment banker patient

## 2019-09-15 MED ORDER — LABETALOL HCL 100 MG PO TABS: 100.0000 mg | ORAL_TABLET | Freq: Two times a day (BID) | ORAL | 0 refills | Status: DC

## 2019-09-28 ENCOUNTER — Other Ambulatory Visit: Payer: Self-pay | Admitting: Cardiology

## 2019-09-28 MED ORDER — HYDRALAZINE HCL 25 MG PO TABS: 75.0000 mg | ORAL_TABLET | Freq: Two times a day (BID) | ORAL | 3 refills | Status: DC

## 2019-09-28 NOTE — Telephone Encounter (Signed)
Spoke to the patient just now and let him know that Dr. Dulce Sellar said it would be fine for him to take the three 25 mg tablets twice daily. He needed a new prescription for this so it was sent in to the [facility] pharmacy for him. He verbalizes understanding and  no other issues or concerns were noted at this time.    Encouraged patient to call back with any questions or concerns.

## 2019-09-28 NOTE — Telephone Encounter (Signed)
New Message:   Pt is taking Hydra  50 mg 1 1/2  tablets 2 times a day. He wants to know if he can take  3  25 mg tablets 2 times a day?

## 2019-10-17 ENCOUNTER — Ambulatory Visit (INDEPENDENT_AMBULATORY_CARE_PROVIDER_SITE_OTHER): Payer: Medicaid Other | Admitting: Pharmacist

## 2019-10-17 ENCOUNTER — Other Ambulatory Visit: Payer: Self-pay

## 2019-10-17 DIAGNOSIS — Q231 Congenital insufficiency of aortic valve: Secondary | ICD-10-CM

## 2019-10-17 DIAGNOSIS — I4891 Unspecified atrial fibrillation: Secondary | ICD-10-CM

## 2019-10-17 DIAGNOSIS — Z5181 Encounter for therapeutic drug level monitoring: Secondary | ICD-10-CM | POA: Diagnosis not present

## 2019-10-17 DIAGNOSIS — Z7901 Long term (current) use of anticoagulants: Secondary | ICD-10-CM | POA: Diagnosis not present

## 2019-10-17 LAB — POCT INR: INR: 2.3 (ref 2.0–3.0)

## 2019-10-17 NOTE — Patient Instructions (Addendum)
Continue warfarin 1 tablet daily except none on Sundays. (Having trouble with tablet crumbling when breaking in half) Amiodarone was decreased to 100mg  daily on 07/27/19  Recheck in 6 weeks.  Call Coumadin Clinic with any medication changes or up coming procedures. Coumadin Clinic # [phone]

## 2019-10-27 ENCOUNTER — Ambulatory Visit: Payer: Medicaid Other | Admitting: Cardiology

## 2019-10-27 ENCOUNTER — Encounter: Payer: Self-pay | Admitting: Cardiology

## 2019-10-27 ENCOUNTER — Other Ambulatory Visit: Payer: Self-pay

## 2019-10-27 ENCOUNTER — Ambulatory Visit (INDEPENDENT_AMBULATORY_CARE_PROVIDER_SITE_OTHER): Payer: Medicaid Other | Admitting: Cardiology

## 2019-10-27 VITALS — BP 144/80 | HR 61 | Ht 72.0 in | Wt 175.0 lb

## 2019-10-27 DIAGNOSIS — I130 Hypertensive heart and chronic kidney disease with heart failure and stage 1 through stage 4 chronic kidney disease, or unspecified chronic kidney disease: Secondary | ICD-10-CM

## 2019-10-27 DIAGNOSIS — Z79899 Other long term (current) drug therapy: Secondary | ICD-10-CM

## 2019-10-27 DIAGNOSIS — I5042 Chronic combined systolic (congestive) and diastolic (congestive) heart failure: Secondary | ICD-10-CM

## 2019-10-27 DIAGNOSIS — Z7901 Long term (current) use of anticoagulants: Secondary | ICD-10-CM

## 2019-10-27 DIAGNOSIS — I480 Paroxysmal atrial fibrillation: Secondary | ICD-10-CM

## 2019-10-27 DIAGNOSIS — N183 Chronic kidney disease, stage 3 unspecified: Secondary | ICD-10-CM

## 2019-10-27 DIAGNOSIS — Z952 Presence of prosthetic heart valve: Secondary | ICD-10-CM | POA: Diagnosis not present

## 2019-10-27 NOTE — Addendum Note (Signed)
Addended by: Delorse Limber I on: 10/27/2019 02:59 PM   Modules accepted: Orders

## 2019-10-27 NOTE — Patient Instructions (Signed)
Medication Instructions:  Your physician recommends that you continue on your current medications as directed. Please refer to the Current Medication list given to you today.  *If you need a refill on your cardiac medications before your next appointment, please call your pharmacy*   Lab Work: Your physician recommends that you return for lab work in: TODAY BMP If you have labs (blood work) drawn today and your tests are completely normal, you will receive your results only by: MyChart Message (if you have MyChart) OR A paper copy in the mail If you have any lab test that is abnormal or we need to change your treatment, we will call you to review the results.   Testing/Procedures: None   Follow-Up: At [facility], you and your health needs are our priority.  As part of our continuing mission to provide you with exceptional heart care, we have created designated Provider Care Teams.  These Care Teams include your primary Cardiologist (physician) and Advanced Practice Providers (APPs -  Physician Assistants and Nurse Practitioners) who all work together to provide you with the care you need, when you need it.  We recommend signing up for the patient portal called "MyChart".  Sign up information is provided on this After Visit Summary.  MyChart is used to connect with patients for Virtual Visits (Telemedicine).  Patients are able to view lab/test results, encounter notes, upcoming appointments, etc.  Non-urgent messages can be sent to your provider as well.   To learn more about what you can do with MyChart, go to [email or web address].    Your next appointment:   6 month(s)  The format for your next appointment:   In Person  Provider:   Brian Munley, MD   Other Instructions   

## 2019-10-27 NOTE — Progress Notes (Signed)
Cardiology Office Note:    Date:  10/27/2019   ID:  Joe Klein, DOB Mar 05, 1961, MRN [id number]  PCP:  Townsend Roger, MD  Cardiologist:  Shirlee More, MD    Referring MD: Nona Dell, Corene Cornea, MD    ASSESSMENT:    1. S/P AVR (aortic valve replacement)   2. Long term (current) use of anticoagulants   3. Paroxysmal atrial fibrillation (HCC)   4. On amiodarone therapy   5. 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 Continues to do exceptionally well good recovery from his valve surgery LV function is normalized and compliant with his anticoagulant. 2. Stable maintaining sinus rhythm low-dose amiodarone without toxicity 3. BP is at target continue current treatment he has chronic kidney disease I had wanted to use an MRA but we stopped I recheck his renal function today   Next appointment: 6 months   Medication Adjustments/Labs and Tests Ordered: Current medicines are reviewed at length with the patient today.  Concerns regarding medicines are outlined above.  No orders of the defined types were placed in this encounter.  No orders of the defined types were placed in this encounter.   Chief Complaint  Patient presents with  . Follow-up    History of Present Illness:    Joe Klein is a 59 y.o. male with a hx of heart failure, bicuspid aortic valve aneurysm ascending aorta, hypertension with chronic kidney disease stage III and combined systolic and diastolic heart failure and  CT surgery for AS and thoracic aorta aneurysm 12/16/2018.   kins, Glenice Bow, MD         Procedure Laterality Anesthesia  AORTIC VALVE REPLACEMENT (AVR) 23 mm On-X mechanical valve N/A General  THORACIC ASCENDING ANEURYSM REPAIR 28 mm Hemashield platinum dacryon proximally and a distal hemi-arch  reconstruction  N/A General  MAZE N/A General  TRANSESOPHAGEAL ECHOCARDIOGRAM (TEE) N/A General  CLIPPING OF ATRIAL APPENDAGE Left General  Appendage clipping with AtriCure AtriClip 50.         He was last seen 07/27/2019. Compliance with diet, lifestyle and medications: Yes  He continues to do well he has no cardiovascular symptoms of shortness of breath chest pain palpitations syncope and has intermittent trace edema left ankle.  Saw an ophthalmologist was told that they could see amiodarone and his tears.  I told him this is not a toxicity of the drug.  He remains anticoagulated managed in our coagulation clinic without bleeding complication   Ref Range & Units 10 d ago 1 mo ago 3 mo ago  INR 2.0 - 3.0 2.3  2.3  2.6    Ref Range & Units 3 mo ago 8 mo ago 9 mo ago   Glucose 65 - 99 mg/dL 118High  88  108High   BUN 8 - 27 mg/dL 33High  34High R  34High R   Creatinine, Ser 0.76 - 1.27 mg/dL 2.38High  2.34High  2.08High   GFR calc non Af Amer >59 mL/min/1.73 29Low  29Low  34Low   GFR calc Af Amer >59 mL/min/1.73 33Low  34Low  39Low   BUN/Creatinine Ratio 10 - 24 14  15  R  16 R   Sodium 134 - 144 mmol/L 138  136  138   Potassium 3.5 - 5.2 mmol/L 4.8  4.8  4.6   Chloride 96 - 106 mmol/L 104  103  106   CO2 20 - 29 mmol/L 22  20  18Low   Ref Range & Units 3 mo ago 9 mo ago   NT-Pro BNP 0 - 210 pg/mL 1,276High  1,316   Echo 08/31/2019: 1. Left ventricular ejection fraction, by estimation, is 65 to 70%. The  left ventricle has normal function. The left ventricle has no regional  wall motion abnormalities. There is moderate concentric left ventricular  hypertrophy. Left ventricular  diastolic parameters are consistent with Grade III diastolic dysfunction  (restrictive).  2. Right ventricular systolic function is normal. The right ventricular  size is normal. There is mildly elevated pulmonary artery systolic  pressure.  3. The mitral valve is  normal in structure. Trivial mitral valve  regurgitation. No evidence of mitral stenosis.  4. Replacement of ascending aorta with super coronary graft implantation  (28 mm Hemashield platinum dacryon) proximally and a distal hemi-arch  reconstruction under hypothermic circulatory arrest (17 minutes with 15  minutes of antegrade cerebral  perfusion), aortic valve replacement (23 mm On-X mechanical valve),  bilateral pulmonary vein isolation and left atrial appendage clipping. The  aortic valve is normal in structure. Aortic valve regurgitation 2 trivial  jets. No aortic stenosis is present.  There is a 23 mm On X On X valve present in the aortic position. Procedure  Date: 12/17/2018. Echo findings are consistent with normal structure and  function of the aortic valve prosthesis.  Past Medical History:  Diagnosis Date  . AAA (abdominal aortic aneurysm) (HCC)   . Aortic stenosis   . CHF (congestive heart failure) (HCC)   . Chronic kidney disease    stage 3  . Dysrhythmia    A-fib  . Hypertension     Past Surgical History:  Procedure Laterality Date  . AORTIC VALVE REPLACEMENT N/A 12/16/2018   Procedure: AORTIC VALVE REPLACEMENT (AVR);  Surgeon: Linden Dolin, MD;  Location: [facility] OR;  Service: Open Heart Surgery;  Laterality: N/A;  . CARDIOVERSION N/A 11/21/2018   Procedure: CARDIOVERSION (CATH LAB);  Surgeon: Marinus Maw, MD;  Location: [facility] INVASIVE CV LAB;  Service: Cardiovascular;  Laterality: N/A;  . CLIPPING OF ATRIAL APPENDAGE Left 12/16/2018   Procedure: CLIPPING OF ATRIAL APPENDAGE;  Surgeon: Linden Dolin, MD;  Location: [facility] OR;  Service: Open Heart Surgery;  Laterality: Left;  Appendage clipping with AtriCure AtriClip 50.  Marland Kitchen MANDIBLE FRACTURE SURGERY    . MAZE N/A 12/16/2018   Procedure: MAZE;  Surgeon: Linden Dolin, MD;  Location: [facility] OR;  Service: Open Heart Surgery;  Laterality: N/A;  . RIGHT/LEFT HEART CATH AND CORONARY ANGIOGRAPHY N/A 11/18/2018   Procedure:  RIGHT/LEFT HEART CATH AND CORONARY ANGIOGRAPHY;  Surgeon: Corky Crafts, MD;  Location[facility] INVASIVE CV LAB;  Service: Cardiovascular;  Laterality: N/A;  . TEE WITHOUT CARDIOVERSION N/A 12/16/2018   Procedure: TRANSESOPHAGEAL ECHOCARDIOGRAM (TEE);  Surgeon: Linden Dolin, MD;  Location: [facility] OR;  Service: Open Heart Surgery;  Laterality: N/A;  . THORACIC AORTIC ANEURYSM REPAIR N/A 12/16/2018   Procedure: THORACIC ASCENDING ANEURYSM REPAIR;  Surgeon: Linden Dolin, MD;  Location: [facility] OR;  Service: Open Heart Surgery;  Laterality: N/A;  . TONSILLECTOMY      Current Medications: Current Meds  Medication Sig  . amiodarone (PACERONE) 100 MG tablet Take 1 tablet (100 mg total) by mouth daily.  Marland Kitchen aspirin 81 MG EC tablet Take 81 mg by mouth daily.   Marland Kitchen  atorvastatin (LIPITOR) 40 MG tablet Take 40 mg by mouth daily.  Marland Kitchen eplerenone (INSPRA) 25 MG tablet Take 1 tablet (25 mg total) by mouth daily.  . furosemide (LASIX) 20 MG tablet Take 1 tablet by mouth once or twice weekly.  . hydrALAZINE (APRESOLINE) 25 MG tablet Take 3 tablets (75 mg total) by mouth in the morning and at bedtime.  Marland Kitchen labetalol (NORMODYNE) 100 MG tablet Take 1 tablet (100 mg total) by mouth 2 (two) times daily.  Marland Kitchen warfarin (COUMADIN) 2.5 MG tablet Take 1 tablet daily except none on Sundays     Allergies:   Patient has no known allergies.   Social History   Socioeconomic History  . Marital status: Single    Spouse name: Not on file  . Number of children: Not on file  . Years of education: Not on file  . Highest education level: Not on file  Occupational History  . Not on file  Tobacco Use  . Smoking status: Never Smoker  . Smokeless tobacco: Never Used  Vaping Use  . Vaping Use: Never used  Substance and Sexual Activity  . Alcohol use: Never  . Drug use: Never  . Sexual activity: Not on file  Other Topics Concern  . Not on file  Social History Narrative  . Not on file   Social Determinants of Health    Financial Resource Strain:   . Difficulty of Paying Living Expenses:   Food Insecurity:   . Worried About Programme researcher, broadcasting/film/video in the Last Year:   . Barista in the Last Year:   Transportation Needs:   . Freight forwarder (Medical):   Marland Kitchen Lack of Transportation (Non-Medical):   Physical Activity:   . Days of Exercise per Week:   . Minutes of Exercise per Session:   Stress:   . Feeling of Stress :   Social Connections:   . Frequency of Communication with Friends and Family:   . Frequency of Social Gatherings with Friends and Family:   . Attends Religious Services:   . Active Member of Clubs or Organizations:   . Attends Banker Meetings:   Marland Kitchen Marital Status:      Family History: The patient's family history includes Cancer in his father; Heart disease in his mother; Hypertension in his brother and mother. ROS:   Please see the history of present illness.    All other systems reviewed and are negative.  EKGs/Labs/Other Studies Reviewed:    The following studies were reviewed today:  Recent Labs: 02/17/2019: Magnesium 2.2 07/27/2019: ALT 36; BUN 33; Creatinine, Ser 2.38; Hemoglobin 12.4; NT-Pro BNP 1,276; Platelets 218; Potassium 4.8; Sodium 138; TSH 1.580  Recent Lipid Panel No results found for: CHOL, TRIG, HDL, CHOLHDL, VLDL, LDLCALC, LDLDIRECT  Physical Exam:    VS:  BP (!) 144/80 (BP Location: Right Arm, Patient Position: Sitting, Cuff Size: Normal)   Pulse 61   Ht 6' (1.829 m)   Wt 175 lb (79.4 kg)   SpO2 98%   BMI 23.73 kg/m     Wt Readings from Last 3 Encounters:  10/27/19 175 lb (79.4 kg)  07/27/19 179 lb (81.2 kg)  03/20/19 178 lb (80.7 kg)     GEN:  Well nourished, well developed in no acute distress HEENT: Normal NECK: No JVD; No carotid bruits LYMPHATICS: No lymphadenopathy CARDIAC: Sharp closing sound no murmur RRR, no murmurs, rubs, gallops RESPIRATORY:  Clear to auscultation without rales, wheezing or rhonchi  ABDOMEN:  Soft, non-tender, non-distended MUSCULOSKELETAL:  No edema; No deformity  SKIN: Warm and dry NEUROLOGIC:  Alert and oriented x 3 PSYCHIATRIC:  Normal affect    Signed, Norman Herrlich, MD  10/27/2019 2:54 PM    [facility]

## 2019-10-28 LAB — BASIC METABOLIC PANEL
BUN/Creatinine Ratio: 15 (ref 10–24)
BUN: 32 mg/dL — ABNORMAL HIGH (ref 8–27)
CO2: 22 mmol/L (ref 20–29)
Calcium: 9.3 mg/dL (ref 8.6–10.2)
Chloride: 105 mmol/L (ref 96–106)
Creatinine, Ser: 2.14 mg/dL — ABNORMAL HIGH (ref 0.76–1.27)
GFR calc Af Amer: 38 mL/min/{1.73_m2} — ABNORMAL LOW (ref >59)
GFR calc non Af Amer: 32 mL/min/{1.73_m2} — ABNORMAL LOW (ref >59)
Glucose: 94 mg/dL (ref 65–99)
Potassium: 4.9 mmol/L (ref 3.5–5.2)
Sodium: 139 mmol/L (ref 134–144)

## 2019-10-30 ENCOUNTER — Telehealth: Payer: Self-pay

## 2019-10-30 NOTE — Telephone Encounter (Signed)
-----   Message from Baldo Daub, MD sent at 10/28/2019 12:05 PM EDT ----- Normal or stable result  Kidney function is improved no changes

## 2019-10-30 NOTE — Telephone Encounter (Signed)
Spoke with patient regarding results and recommendation.  Patient verbalizes understanding and is agreeable to plan of care. Advised patient to call back with any issues or concerns.  

## 2019-11-08 ENCOUNTER — Other Ambulatory Visit: Payer: Self-pay | Admitting: Cardiology

## 2019-11-08 MED ORDER — ATORVASTATIN CALCIUM 40 MG PO TABS: 40.0000 mg | ORAL_TABLET | Freq: Every day | ORAL | 3 refills | Status: DC

## 2019-11-08 NOTE — Telephone Encounter (Signed)
Refill sent in per request.  

## 2019-11-08 NOTE — Telephone Encounter (Signed)
*  STAT* If patient is at the pharmacy, call can be transferred to refill team.   1. Which medications need to be refilled? (please list name of each medication and dose if known) atorvastatin (LIPITOR) 40 MG tablet  2. Which pharmacy/location (including street and city if local pharmacy) is medication to be sent to? [facility] - [place], [place] - [address]  3. Do they need a 30 day or 90 day supply? 90   Patient is out of medication.

## 2019-11-17 ENCOUNTER — Telehealth: Payer: Self-pay | Admitting: Cardiology

## 2019-11-17 NOTE — Telephone Encounter (Signed)
Patient is returning call.  °

## 2019-11-17 NOTE — Telephone Encounter (Signed)
Left message on patients voicemail to please return our call.   

## 2019-11-17 NOTE — Telephone Encounter (Signed)
Spoke with patient regarding results and recommendation.  Patient verbalizes understanding and is agreeable to plan of care. Advised patient to call back with any issues or concerns.  

## 2019-11-17 NOTE — Telephone Encounter (Signed)
Pt c/o medication issue:  1. Name of Medication: atorvastatin (LIPITOR) 40 MG tablet  2. How are you currently taking this medication (dosage and times per day)? 1 tablet daily  3. Are you having a reaction (difficulty breathing--STAT)? no  4. What is your medication issue? Patient stating his friend who is a pharmacist recommended he ask about starting CoQ-10, because it can help with statins. He states he can get a response through [place] or a call back.can sent mychart

## 2019-11-17 NOTE — Telephone Encounter (Signed)
Good idea, OTC

## 2019-11-19 IMAGING — DX PORTABLE CHEST - 1 VIEW
1 series · 1 of 1 positions shown · non-contrast
Comparison: Earlier today.

CLINICAL DATA: Chest tube placement.

EXAM:
PORTABLE CHEST 1 VIEW

[chest ap]
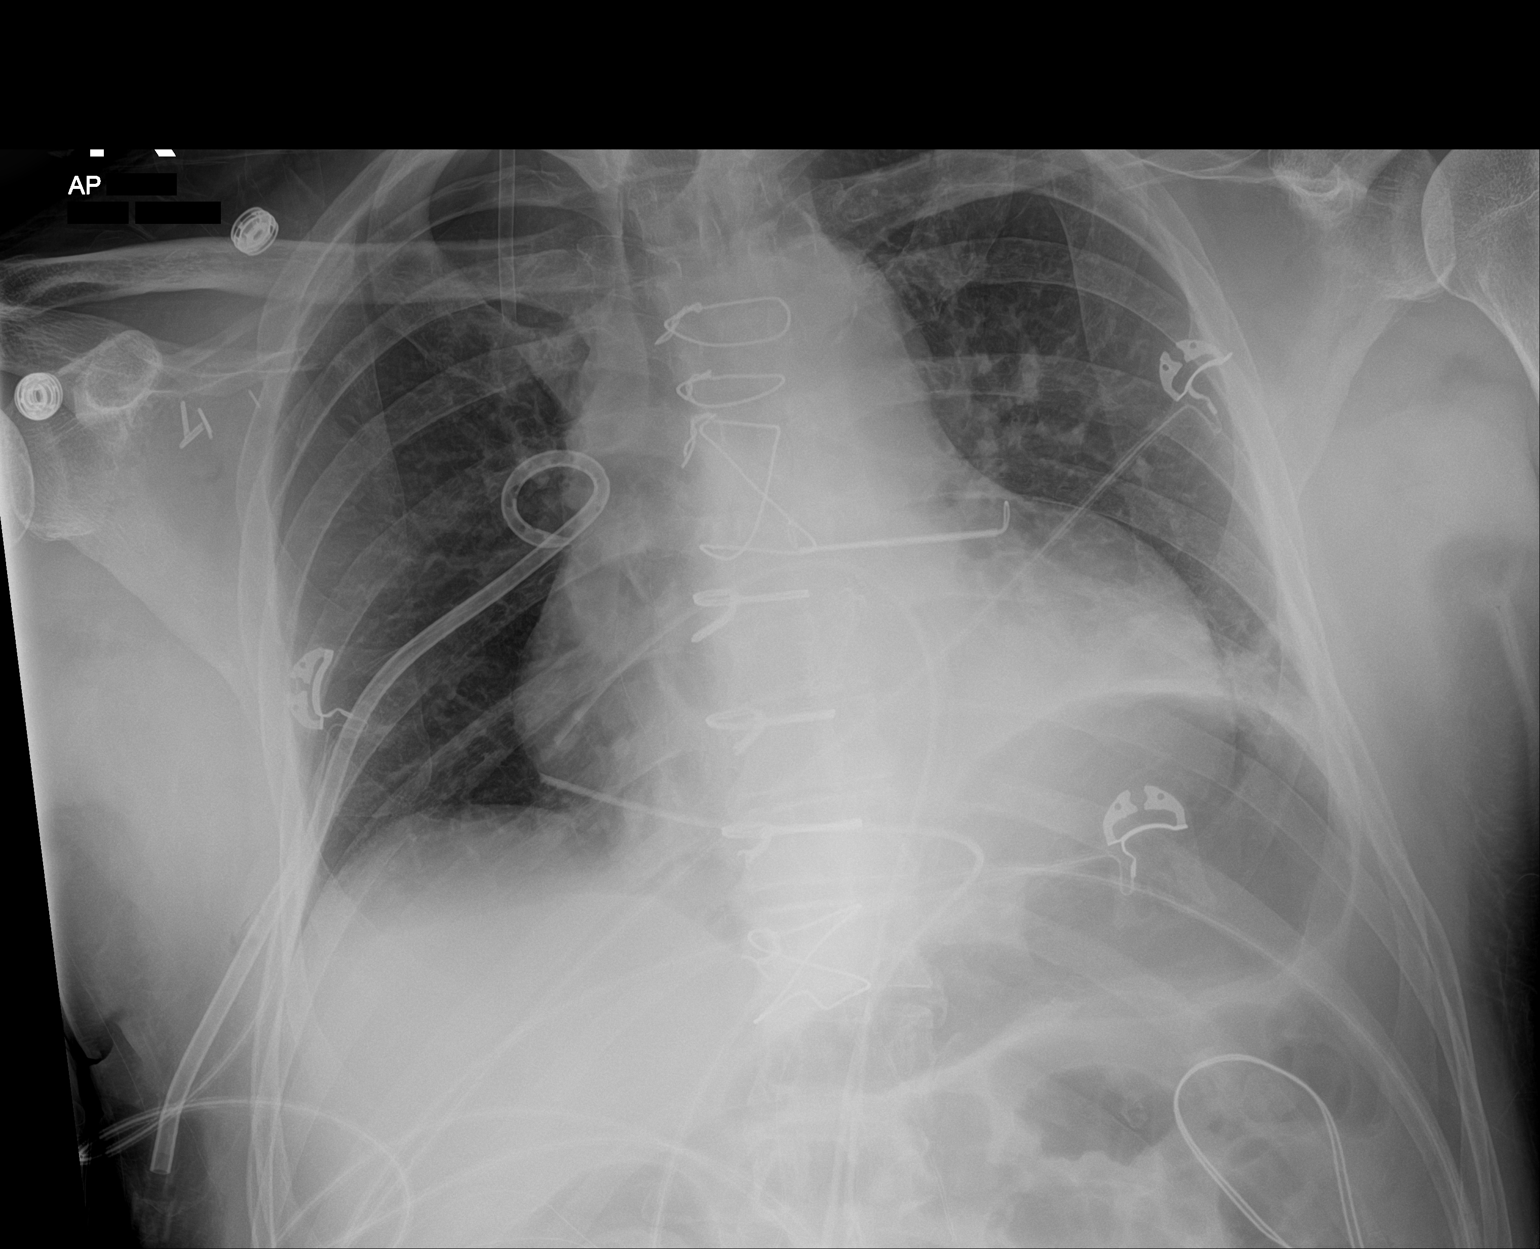

[1 of 1 positions shown; findings below may reference images not displayed]

FINDINGS: Interval right pigtail pleural catheter. No significant change in an
approximately 15% right pneumothorax. Stable enlarged cardiac
silhouette and median sternotomy wires with a left atrial clip.
Stable prosthetic heart valve and mediastinal drainage catheters.
Mild patchy and linear density at the left lung base without
significant change. Unremarkable bones.
IMPRESSION: 1. Stable approximately 15% right pneumothorax following pleural
catheter placement.
2. Stable mild left basilar atelectasis.
3. Stable cardiomegaly.

## 2019-11-19 IMAGING — DX CHEST  1 VIEW
1 series · 1 of 1 positions shown · non-contrast
Comparison: Chest x-ray from earlier same day. Chest x-ray dated
12/16/2018.

CLINICAL DATA: RIGHT pneumothorax, open heart surgery yesterday.

EXAM:
CHEST  1 VIEW

[chest ap]
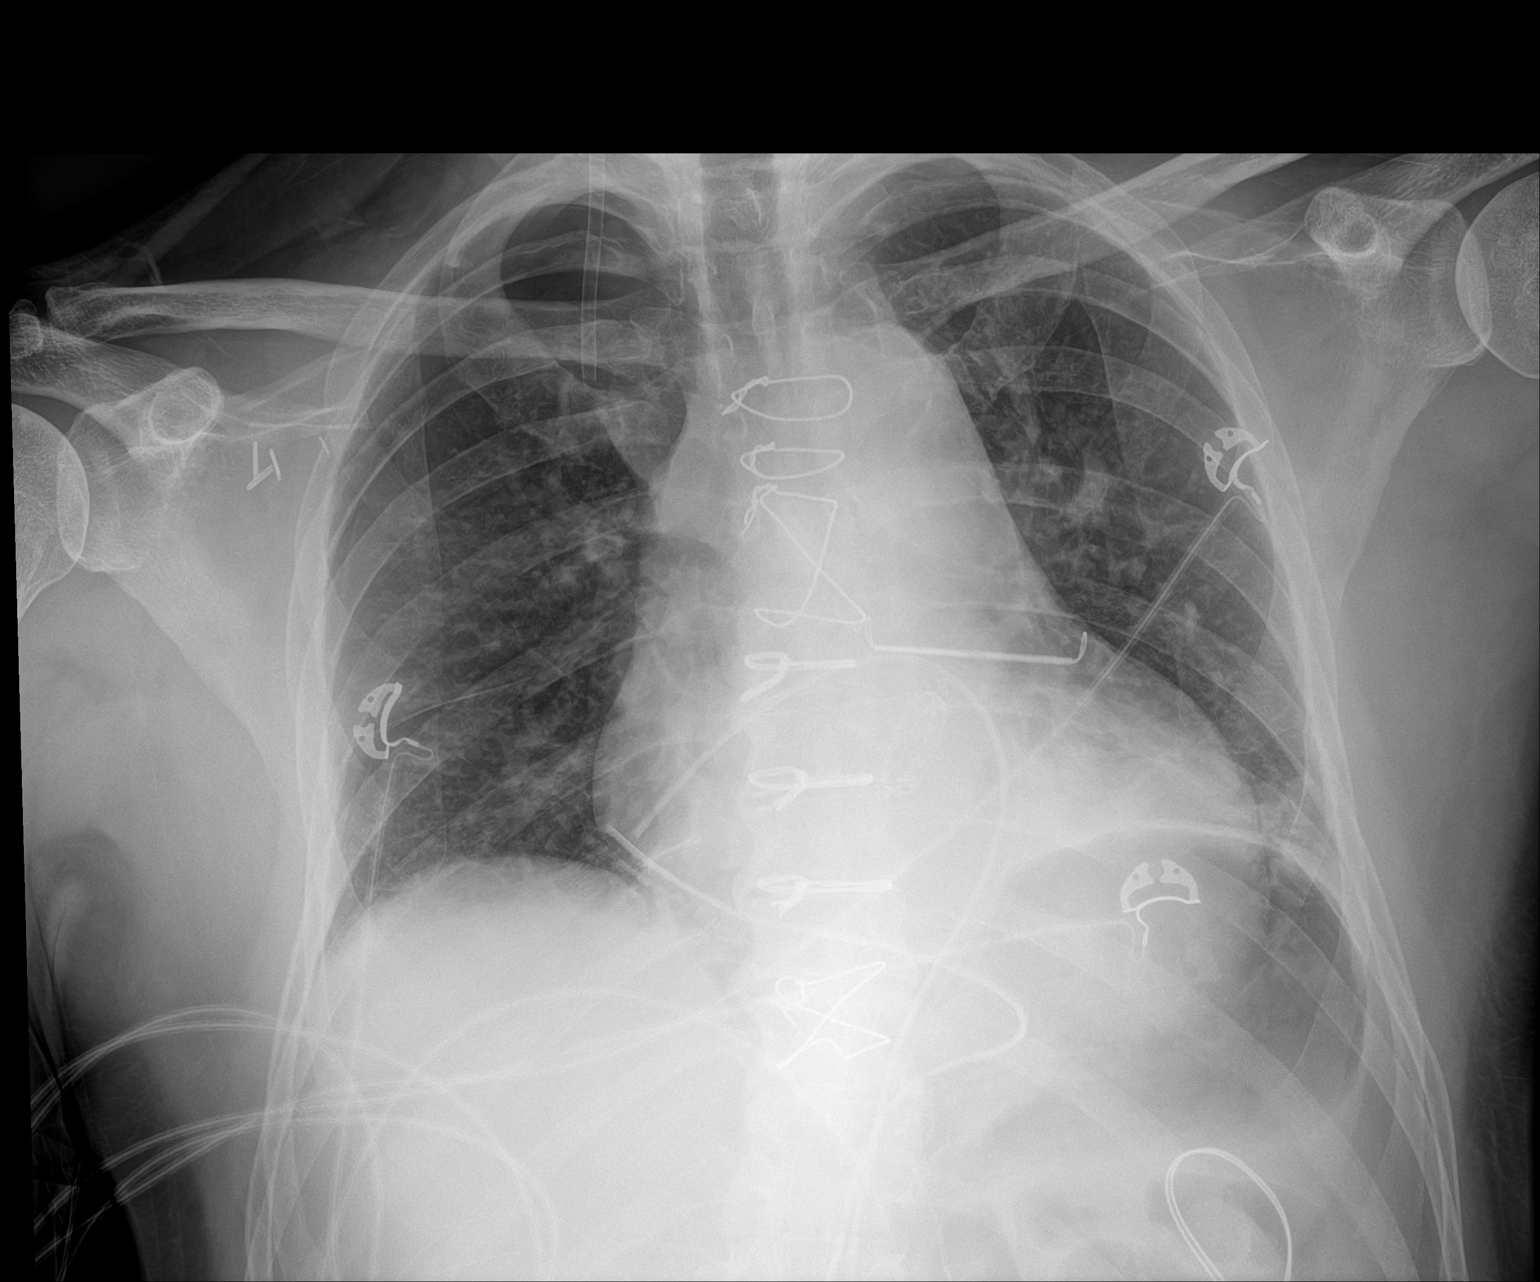

[1 of 1 positions shown; findings below may reference images not displayed]

FINDINGS: Heart size and mediastinal contours are stable. Swan-Ganz catheter
has been removed, with sheath remaining just above the level of the
SVC. Probable mild atelectasis at the LEFT lung base. Lungs
otherwise clear. Stable moderate-sized pneumothorax on the RIGHT. No
evidence of mediastinal shift seen. Mediastinal drains remain in
place.
IMPRESSION: 1. Stable moderate-sized RIGHT pneumothorax.
2. Probable mild atelectasis at the LEFT lung base.

## 2019-11-23 IMAGING — CR CHEST - 2 VIEW
2 series · 2 of 2 positions shown · non-contrast
Comparison: Chest radiograph 12/19/2018, 12/18/2018

CLINICAL DATA: Reason for exam: surgery follow-up examination Hx of
Aortic Valve replacement 12/16/2018 Hx of HTN and CHF

EXAM:
CHEST - 2 VIEW

[chest pa]
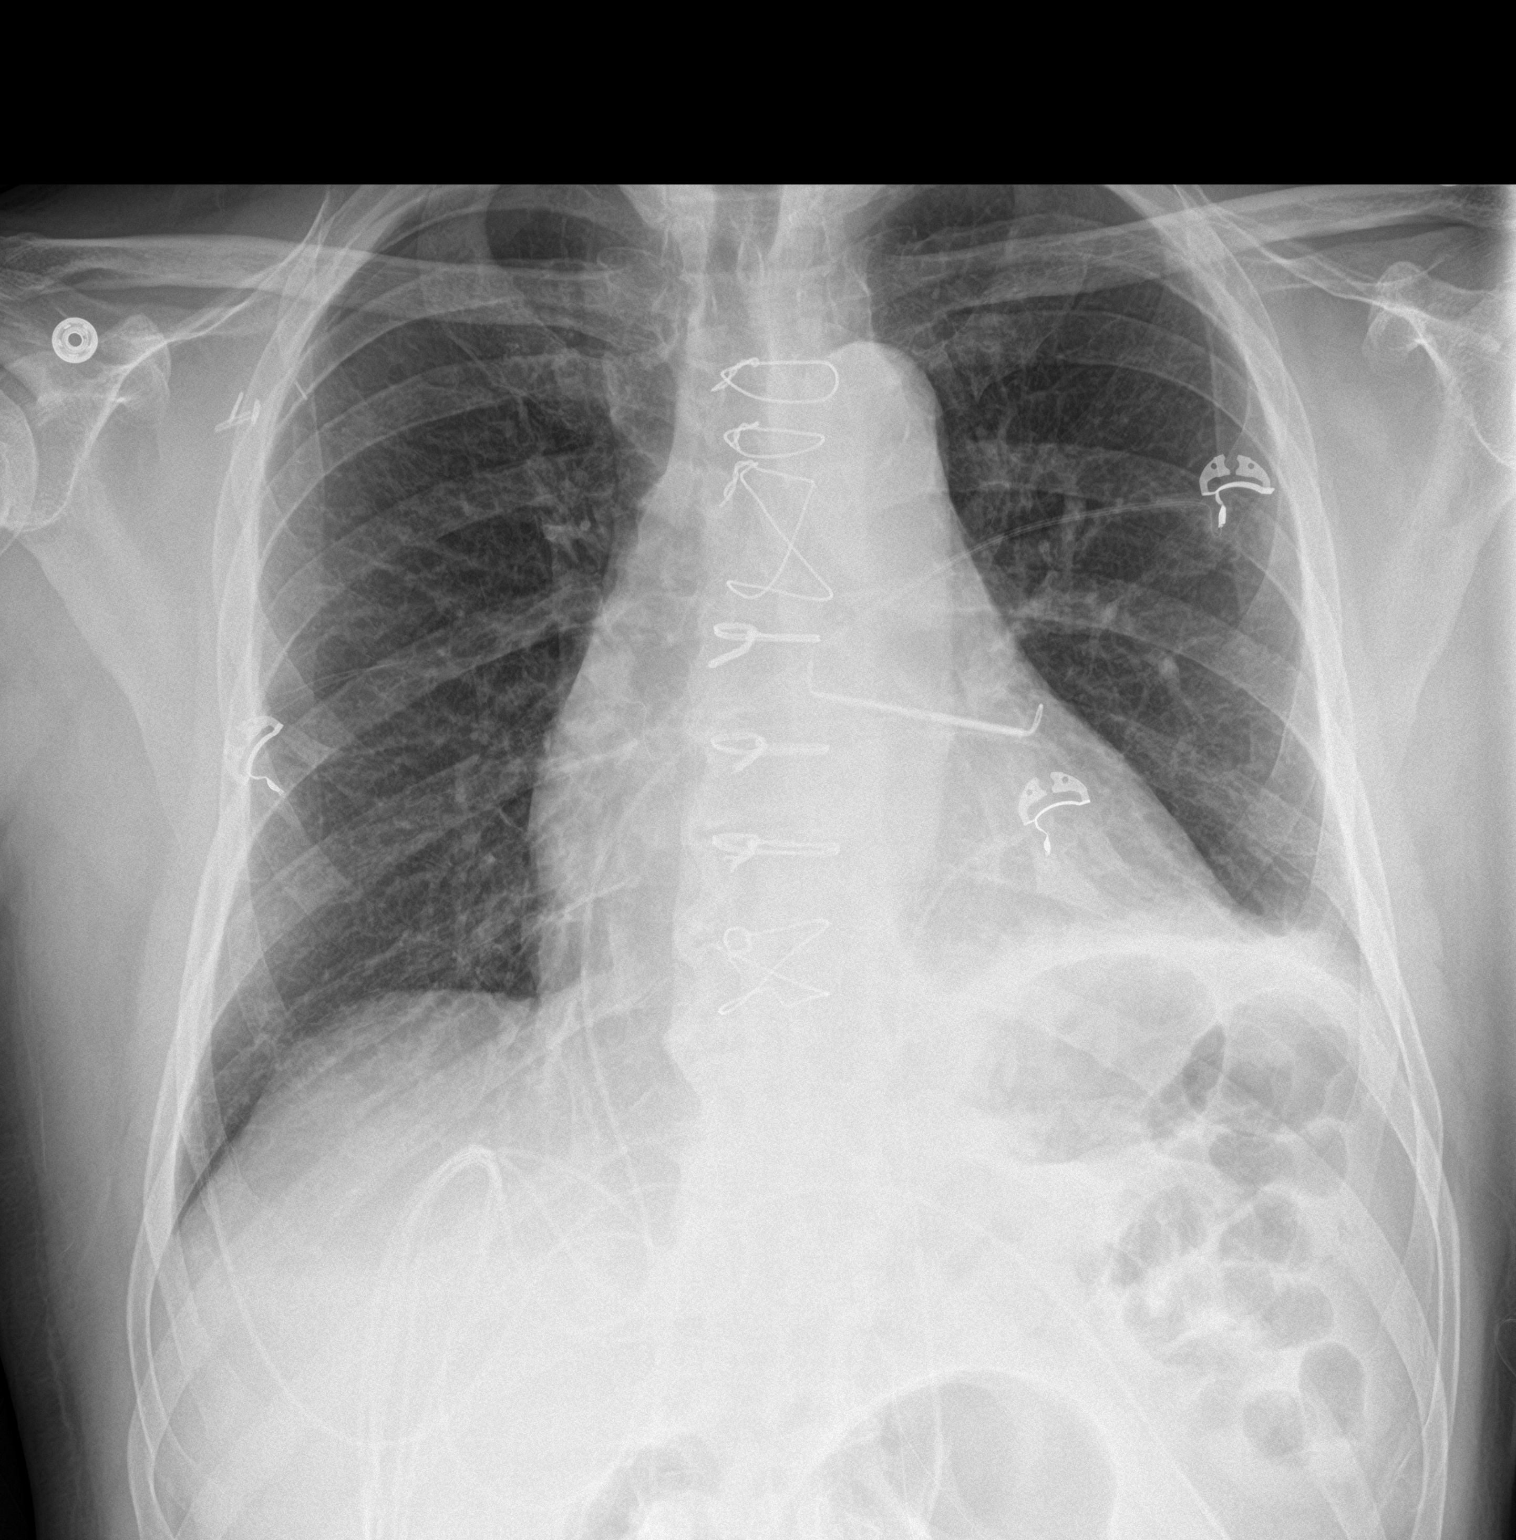

[chest lat]
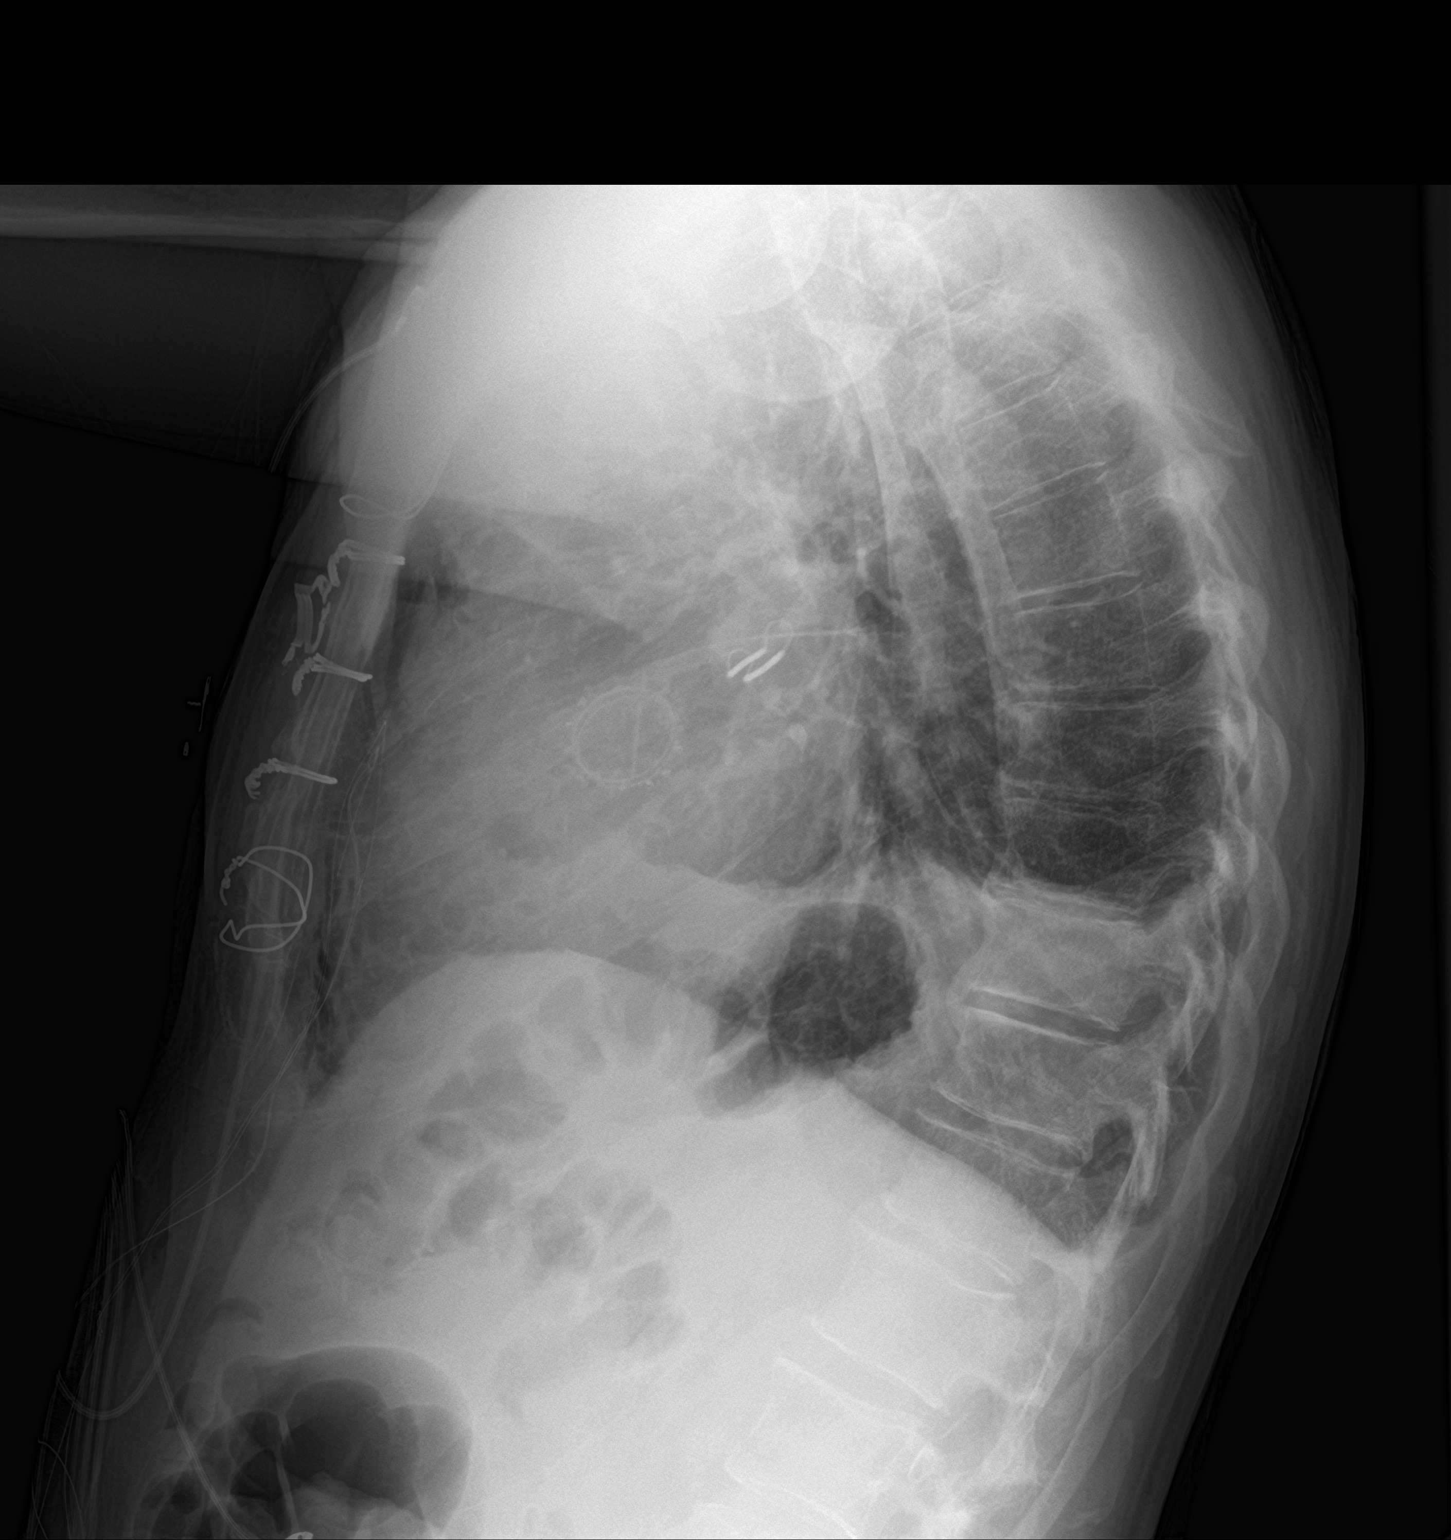

[2 of 2 positions shown; findings below may reference images not displayed]

FINDINGS: Stable cardiomediastinal contours with enlarged heart size status
post median sternotomy and valve replacement. Interval removal of a
right IJ sheath. Decreased opacities in the left lung base likely
reflecting atelectasis. The right lung is clear. Persistent tiny
right apical pneumothorax. Trace left pleural fluid. Upper abdomen
is unremarkable. Degenerative changes are seen in the thoracic
spine.
IMPRESSION: 1. Persistent tiny right apical pneumothorax.
2. Decreased left basilar opacities likely reflecting atelectasis.
Trace left pleural fluid.

## 2019-11-24 ENCOUNTER — Telehealth: Payer: Self-pay | Admitting: *Deleted

## 2019-11-24 DIAGNOSIS — Z952 Presence of prosthetic heart valve: Secondary | ICD-10-CM

## 2019-11-24 DIAGNOSIS — I10 Essential (primary) hypertension: Secondary | ICD-10-CM

## 2019-11-24 MED ORDER — LABETALOL HCL 100 MG PO TABS: 100.0000 mg | ORAL_TABLET | Freq: Two times a day (BID) | ORAL | 0 refills | Status: DC

## 2019-11-24 NOTE — Addendum Note (Signed)
Addended by: Roosvelt Harps R on: 11/24/2019 05:01 PM   Modules accepted: Orders

## 2019-11-24 NOTE — Telephone Encounter (Signed)
Rx refill sent to pharmacy. Pt called [facility] back and they said they had about 50 Labetolol 100mg  tabs and would give those to him and then get the rest to him when they come in. Pt was satisfied with this.

## 2019-11-24 NOTE — Telephone Encounter (Signed)
I would try another pharmacy

## 2019-11-24 NOTE — Telephone Encounter (Signed)
Fax from pharmacy: Sheryn Bison is on back order until at least early August. Do you want to change medication?

## 2019-11-28 ENCOUNTER — Other Ambulatory Visit: Payer: Self-pay

## 2019-11-28 ENCOUNTER — Ambulatory Visit (INDEPENDENT_AMBULATORY_CARE_PROVIDER_SITE_OTHER): Payer: Medicaid Other

## 2019-11-28 DIAGNOSIS — Q231 Congenital insufficiency of aortic valve: Secondary | ICD-10-CM | POA: Diagnosis not present

## 2019-11-28 DIAGNOSIS — I4891 Unspecified atrial fibrillation: Secondary | ICD-10-CM

## 2019-11-28 DIAGNOSIS — Z5181 Encounter for therapeutic drug level monitoring: Secondary | ICD-10-CM

## 2019-11-28 DIAGNOSIS — Z7901 Long term (current) use of anticoagulants: Secondary | ICD-10-CM | POA: Diagnosis not present

## 2019-11-28 LAB — POCT INR: INR: 1.9 — AB (ref 2.0–3.0)

## 2019-11-28 NOTE — Patient Instructions (Signed)
Take 2 tablets tonight and then continue warfarin 1 tablet daily except none on Sundays. (Having trouble with tablet crumbling when breaking in half) Amiodarone was decreased to 100mg  daily on 07/27/19  Recheck in 4 weeks.  Call [facility]in Clinic with any medicaiton changes or up coming procedures. [facility] # [phone]

## 2019-12-12 DIAGNOSIS — Z952 Presence of prosthetic heart valve: Secondary | ICD-10-CM

## 2019-12-12 DIAGNOSIS — I10 Essential (primary) hypertension: Secondary | ICD-10-CM

## 2019-12-12 MED ORDER — AMIODARONE HCL 100 MG PO TABS: 100.0000 mg | ORAL_TABLET | Freq: Every day | ORAL | 1 refills | Status: DC

## 2019-12-18 MED ORDER — LABETALOL HCL 100 MG PO TABS: 100.0000 mg | ORAL_TABLET | Freq: Two times a day (BID) | ORAL | 2 refills | Status: DC

## 2019-12-18 MED ORDER — AMIODARONE HCL 100 MG PO TABS: 100.0000 mg | ORAL_TABLET | Freq: Every day | ORAL | 4 refills | Status: DC

## 2019-12-18 NOTE — Addendum Note (Signed)
Addended by: Deontay Ladnier, Elmarie Shiley L on: 12/18/2019 04:08 PM   Modules accepted: Orders

## 2019-12-20 ENCOUNTER — Telehealth: Payer: Self-pay | Admitting: Cardiology

## 2019-12-20 MED ORDER — AMIODARONE HCL 200 MG PO TABS
100.0000 mg | ORAL_TABLET | Freq: Every day | ORAL | 3 refills | Status: DC
Start: 2019-12-20 — End: 2020-04-17

## 2019-12-20 NOTE — Telephone Encounter (Signed)
Pt c/o medication issue:  1. Name of Medication: amiodarone (PACERONE) 100 MG tablet  2. How are you currently taking this medication (dosage and times per day)? as  3. Are you having a reaction (difficulty breathing--STAT)? No  4. What is your medication issue? Patient states the pharmacy ([facility] - [place], [place] - [address]) advised they are unable to fill Amiodorone 100 MG. They would be able to fill the medication as Pacerone 100 MG, however, he states as Pacerone the medication is no longer affordable. He is requesting to have Rx resubmitted for Amiodorone 200 MG - half a tablet daily. Please assist.

## 2019-12-26 ENCOUNTER — Other Ambulatory Visit: Payer: Self-pay

## 2019-12-26 ENCOUNTER — Ambulatory Visit (INDEPENDENT_AMBULATORY_CARE_PROVIDER_SITE_OTHER): Payer: Medicaid Other | Admitting: *Deleted

## 2019-12-26 DIAGNOSIS — Q231 Congenital insufficiency of aortic valve: Secondary | ICD-10-CM

## 2019-12-26 DIAGNOSIS — I4891 Unspecified atrial fibrillation: Secondary | ICD-10-CM | POA: Diagnosis not present

## 2019-12-26 DIAGNOSIS — Z7901 Long term (current) use of anticoagulants: Secondary | ICD-10-CM

## 2019-12-26 DIAGNOSIS — Z5181 Encounter for therapeutic drug level monitoring: Secondary | ICD-10-CM | POA: Diagnosis not present

## 2019-12-26 DIAGNOSIS — I480 Paroxysmal atrial fibrillation: Secondary | ICD-10-CM

## 2019-12-26 LAB — POCT INR: INR: 2.8 (ref 2.0–3.0)

## 2019-12-26 NOTE — Patient Instructions (Addendum)
Continue warfarin 1 tablet daily except none on Sundays. (Having trouble with tablet crumbling when breaking in half) Amiodarone was decreased to 100mg  daily on 07/27/19  Recheck in 6 weeks.  Call [facility]madin Clinic with any medicaiton changes or up coming procedures. [facility] # [phone]

## 2020-02-06 ENCOUNTER — Other Ambulatory Visit: Payer: Self-pay

## 2020-02-06 ENCOUNTER — Ambulatory Visit (INDEPENDENT_AMBULATORY_CARE_PROVIDER_SITE_OTHER): Payer: Medicaid Other

## 2020-02-06 DIAGNOSIS — I480 Paroxysmal atrial fibrillation: Secondary | ICD-10-CM

## 2020-02-06 DIAGNOSIS — Q231 Congenital insufficiency of aortic valve: Secondary | ICD-10-CM

## 2020-02-06 DIAGNOSIS — Z5181 Encounter for therapeutic drug level monitoring: Secondary | ICD-10-CM

## 2020-02-06 DIAGNOSIS — Z7901 Long term (current) use of anticoagulants: Secondary | ICD-10-CM | POA: Diagnosis not present

## 2020-02-06 LAB — POCT INR: INR: 2 (ref 2.0–3.0)

## 2020-02-06 NOTE — Patient Instructions (Signed)
Continue warfarin 1 tablet daily except none on Sundays.  Recheck in 6 weeks.  Call Coumadin Clinic with any medicaiton changes or up coming procedures. [facility] # [phone]  

## 2020-03-19 ENCOUNTER — Other Ambulatory Visit: Payer: Self-pay

## 2020-03-19 ENCOUNTER — Ambulatory Visit (INDEPENDENT_AMBULATORY_CARE_PROVIDER_SITE_OTHER): Payer: Medicaid Other

## 2020-03-19 DIAGNOSIS — Z7901 Long term (current) use of anticoagulants: Secondary | ICD-10-CM

## 2020-03-19 DIAGNOSIS — I480 Paroxysmal atrial fibrillation: Secondary | ICD-10-CM | POA: Diagnosis not present

## 2020-03-19 DIAGNOSIS — Q231 Congenital insufficiency of aortic valve: Secondary | ICD-10-CM

## 2020-03-19 DIAGNOSIS — Z5181 Encounter for therapeutic drug level monitoring: Secondary | ICD-10-CM

## 2020-03-19 LAB — POCT INR: INR: 2 (ref 2.0–3.0)

## 2020-03-19 NOTE — Patient Instructions (Signed)
Continue warfarin 1 tablet daily except none on Sundays.  Recheck in 7 weeks.  Call Coumadin Clinic with any medicaiton changes or up coming procedures. [facility] # [phone]

## 2020-03-24 ENCOUNTER — Other Ambulatory Visit: Payer: Self-pay | Admitting: Cardiology

## 2020-03-24 ENCOUNTER — Other Ambulatory Visit: Payer: Self-pay

## 2020-03-25 MED ORDER — HYDRALAZINE HCL 25 MG PO TABS: 75.0000 mg | ORAL_TABLET | Freq: Two times a day (BID) | ORAL | 3 refills | Status: DC

## 2020-04-04 ENCOUNTER — Telehealth: Payer: Self-pay

## 2020-04-04 DIAGNOSIS — I714 Abdominal aortic aneurysm, without rupture, unspecified: Secondary | ICD-10-CM | POA: Insufficient documentation

## 2020-04-04 DIAGNOSIS — N189 Chronic kidney disease, unspecified: Secondary | ICD-10-CM | POA: Insufficient documentation

## 2020-04-04 DIAGNOSIS — I499 Cardiac arrhythmia, unspecified: Secondary | ICD-10-CM | POA: Insufficient documentation

## 2020-04-04 DIAGNOSIS — I10 Essential (primary) hypertension: Secondary | ICD-10-CM | POA: Insufficient documentation

## 2020-04-04 DIAGNOSIS — I509 Heart failure, unspecified: Secondary | ICD-10-CM | POA: Insufficient documentation

## 2020-04-04 NOTE — Telephone Encounter (Signed)
Prior Auth for Amiodarone completed and sent to [facility]. Dtermination is pending

## 2020-04-16 NOTE — Progress Notes (Signed)
Cardiology Office Note:    Date:  04/17/2020   ID:  Joe Klein, DOB Dec 10, 1960, MRN [id number]  PCP:  Crist Fat, MD  Cardiologist:  Norman Herrlich, MD    Referring MD: Leonia Reader, Barbara Cower, MD    ASSESSMENT:    1. S/P AVR (aortic valve replacement)   2. Long term (current) use of anticoagulants   3. Paroxysmal atrial fibrillation (HCC)   4. On amiodarone therapy   5. 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 Dyslipidemia    PLAN:    In order of problems listed above:  1. Stable he is made an excellent functional recovery for his mechanical AVR.  I will send a note to the anticoagulant clinic about his recommendations for intensity of anticoagulation with new generation mechanical valve. 2. Although he had surgical maze I would continue him on low-dose amiodarone we will continue to discuss screen for toxicity I offered him EP referral. 3. Stable repeat blood pressure normalizes in the office no evidence of heart failure continue current treatment and recheck renal function 4. Continue statin   Next appointment: 6 months   Medication Adjustments/Labs and Tests Ordered: Current medicines are reviewed at length with the patient today.  Concerns regarding medicines are outlined above.  Orders Placed This Encounter  Procedures  . Comprehensive metabolic panel  . TSH+T4F+T3Free  . EKG 12-Lead   No orders of the defined types were placed in this encounter.   No chief complaint on file.   History of Present Illness:    Joe Klein is a 59 y.o. male with a hx of  heart failure, bicuspid aortic valve aneurysm ascending aorta, hypertension with chronic kidney disease stage III and combined systolic and diastolic heart failure and  CT surgery for AS and thoracic aorta aneurysm 12/16/2018.  He was last seen 10/27/2019. Compliance with diet, lifestyle and  medications: Yes  Barron Schmid, MD      Procedure Laterality Anesthesia  AORTIC VALVE REPLACEMENT (AVR) 23 mm On-X mechanical valve N/A General  THORACIC ASCENDING ANEURYSM REPAIR 28 mm Hemashield platinum dacryon proximally and a distal hemi-arch reconstruction N/A General  MAZE N/A General  TRANSESOPHAGEAL ECHOCARDIOGRAM (TEE) N/A General  CLIPPING OF ATRIAL APPENDAGE Left General  Appendage clipping with AtriCure AtriClip 50.       Component Ref Range & Units 4 wk ago 2 mo ago 3 mo ago 4 mo ago 6 mo ago 7 mo ago 8 mo ago  INR 2.0 - 3.0 2.0  2.0  2.8  1.9Abnormal  2.3  2.3  2.6   Goal INR for the first 3 months with an ON-X valve is 2.0-3.0, then is 1.5-2.0 after the first three months  He is man who truly enjoys life he is very happy pleased with the quality of his life deeply religious.  He has no cardiovascular symptoms of chest pain edema palpitation syncope no bleeding on his anticoagulant.  He is very concerned about taking amiodarone I reviewed the risk mitigating using low dosage checking liver function and thyroid gave him the option referral for to see EP but I am very concerned with the structural alterations of recurrent atrial fibrillation.  We will continue to discuss in the future. Past Medical History:  Diagnosis Date  . AAA (abdominal aortic aneurysm) (HCC)   . Aortic stenosis   . CHF (congestive heart failure) (HCC)   . Chronic  kidney disease    stage 3  . Dysrhythmia    A-fib  . Hypertension     Past Surgical History:  Procedure Laterality Date  . AORTIC VALVE REPLACEMENT N/A 12/16/2018   Procedure: AORTIC VALVE REPLACEMENT (AVR);  Surgeon: Linden Dolin, MD;  Location: [facility] OR;  Service: Open Heart Surgery;  Laterality: N/A;  . CARDIOVERSION N/A 11/21/2018   Procedure: CARDIOVERSION (CATH LAB);  Surgeon: Marinus Maw, MD;  Location: [facility] INVASIVE CV LAB;  Service: Cardiovascular;  Laterality: N/A;  . CLIPPING OF ATRIAL APPENDAGE Left 12/16/2018    Procedure: CLIPPING OF ATRIAL APPENDAGE;  Surgeon: Linden Dolin, MD;  Location: [facility] OR;  Service: Open Heart Surgery;  Laterality: Left;  Appendage clipping with AtriCure AtriClip 50.  Marland Kitchen MANDIBLE FRACTURE SURGERY    . MAZE N/A 12/16/2018   Procedure: MAZE;  Surgeon: Linden Dolin, MD;  Location: [facility] OR;  Service: Open Heart Surgery;  Laterality: N/A;  . RIGHT/LEFT HEART CATH AND CORONARY ANGIOGRAPHY N/A 11/18/2018   Procedure: RIGHT/LEFT HEART CATH AND CORONARY ANGIOGRAPHY;  Surgeon: Corky Crafts, MD;  Location: [facility] INVASIVE CV LAB;  Service: Cardiovascular;  Laterality: N/A;  . TEE WITHOUT CARDIOVERSION N/A 12/16/2018   Procedure: TRANSESOPHAGEAL ECHOCARDIOGRAM (TEE);  Surgeon: Linden Dolin, MD;  Location: [facility] OR;  Service: Open Heart Surgery;  Laterality: N/A;  . THORACIC AORTIC ANEURYSM REPAIR N/A 12/16/2018   Procedure: THORACIC ASCENDING ANEURYSM REPAIR;  Surgeon: Linden Dolin, MD;  Location: [facility] OR;  Service: Open Heart Surgery;  Laterality: N/A;  . TONSILLECTOMY      Current Medications: Current Meds  Medication Sig  . amiodarone (PACERONE) 100 MG tablet Take 100 mg by mouth daily.  Marland Kitchen aspirin 81 MG EC tablet Take 81 mg by mouth daily.   Marland Kitchen atorvastatin (LIPITOR) 40 MG tablet Take 1 tablet (40 mg total) by mouth daily.  . furosemide (LASIX) 20 MG tablet Take 1 tablet by mouth once or twice weekly.  . hydrALAZINE (APRESOLINE) 25 MG tablet Take 3 tablets (75 mg total) by mouth in the morning and at bedtime.  Marland Kitchen labetalol (NORMODYNE) 100 MG tablet Take 1 tablet (100 mg total) by mouth 2 (two) times daily.  Marland Kitchen warfarin (COUMADIN) 2.5 MG tablet Take 1 tablet daily except none on Sundays     Allergies:   Patient has no known allergies.   Social History   Socioeconomic History  . Marital status: Single    Spouse name: Not on file  . Number of children: Not on file  . Years of education: Not on file  . Highest education level: Not on file  Occupational History  .  Not on file  Tobacco Use  . Smoking status: Never Smoker  . Smokeless tobacco: Never Used  Vaping Use  . Vaping Use: Never used  Substance and Sexual Activity  . Alcohol use: Never  . Drug use: Never  . Sexual activity: Not on file  Other Topics Concern  . Not on file  Social History Narrative  . Not on file   Social Determinants of Health   Financial Resource Strain: Not on file  Food Insecurity: Not on file  Transportation Needs: Not on file  Physical Activity: Not on file  Stress: Not on file  Social Connections: Not on file     Family History: The patient's family history includes Cancer in his father; Heart disease in his mother; Hypertension in his brother and mother. ROS:   Please see the history  of present illness.    All other systems reviewed and are negative.  EKGs/Labs/Other Studies Reviewed:    The following studies were reviewed today:  EKG:  EKG ordered today and personally reviewed.  The ekg ordered today demonstrates sinus rhythm LVH  Recent Labs: 07/27/2019: ALT 36; Hemoglobin 12.4; NT-Pro BNP 1,276; Platelets 218; TSH 1.580 10/27/2019: BUN 32; Creatinine, Ser 2.14; Potassium 4.9; Sodium 139  Recent Lipid Panel No results found for: CHOL, TRIG, HDL, CHOLHDL, VLDL, LDLCALC, LDLDIRECT  Physical Exam:    VS:  BP 140/60   Pulse 60   Ht 6' (1.829 m)   Wt 182 lb (82.6 kg)   SpO2 97%   BMI 24.68 kg/m     Wt Readings from Last 3 Encounters:  04/17/20 182 lb (82.6 kg)  10/27/19 175 lb (79.4 kg)  07/27/19 179 lb (81.2 kg)     GEN:  Well nourished, well developed in no acute distress HEENT: Normal NECK: No JVD; No carotid bruits LYMPHATICS: No lymphadenopathy CARDIAC: Sharp closing sound of prosthesis 1/6 midsystolic murmur located in the aortic area no aortic regurgitationRRR, no murmurs, rubs, gallops RESPIRATORY:  Clear to auscultation without rales, wheezing or rhonchi  ABDOMEN: Soft, non-tender, non-distended MUSCULOSKELETAL:  No edema; No  deformity  SKIN: Warm and dry NEUROLOGIC:  Alert and oriented x 3 PSYCHIATRIC:  Normal affect    Signed, Norman Herrlich, MD  04/17/2020 1:29 PM    [facility]

## 2020-04-17 ENCOUNTER — Ambulatory Visit (INDEPENDENT_AMBULATORY_CARE_PROVIDER_SITE_OTHER): Payer: Medicaid Other | Admitting: Cardiology

## 2020-04-17 ENCOUNTER — Encounter: Payer: Self-pay | Admitting: Cardiology

## 2020-04-17 ENCOUNTER — Other Ambulatory Visit: Payer: Self-pay

## 2020-04-17 VITALS — BP 140/60 | HR 60 | Ht 72.0 in | Wt 182.0 lb

## 2020-04-17 DIAGNOSIS — Z952 Presence of prosthetic heart valve: Secondary | ICD-10-CM | POA: Diagnosis not present

## 2020-04-17 DIAGNOSIS — I480 Paroxysmal atrial fibrillation: Secondary | ICD-10-CM | POA: Diagnosis not present

## 2020-04-17 DIAGNOSIS — Z79899 Other long term (current) drug therapy: Secondary | ICD-10-CM

## 2020-04-17 DIAGNOSIS — E785 Hyperlipidemia, unspecified: Secondary | ICD-10-CM

## 2020-04-17 DIAGNOSIS — I130 Hypertensive heart and chronic kidney disease with heart failure and stage 1 through stage 4 chronic kidney disease, or unspecified chronic kidney disease: Secondary | ICD-10-CM

## 2020-04-17 DIAGNOSIS — Z7901 Long term (current) use of anticoagulants: Secondary | ICD-10-CM

## 2020-04-17 DIAGNOSIS — N1832 Chronic kidney disease, stage 3b: Secondary | ICD-10-CM

## 2020-04-17 DIAGNOSIS — I5042 Chronic combined systolic (congestive) and diastolic (congestive) heart failure: Secondary | ICD-10-CM

## 2020-04-17 NOTE — Patient Instructions (Signed)
Medication Instructions:  Your physician recommends that you continue on your current medications as directed. Please refer to the Current Medication list given to you today.  *If you need a refill on your cardiac medications before your next appointment, please call your pharmacy*   Lab Work: Your physician recommends that you return for lab work in: TODAY CMP, TSH, T3, T4 If you have labs (blood work) drawn today and your tests are completely normal, you will receive your results only by: Marland Kitchen MyChart Message (if you have MyChart) OR . A paper copy in the mail If you have any lab test that is abnormal or we need to change your treatment, we will call you to review the results.   Testing/Procedures: None   Follow-Up: At [facility], you and your health needs are our priority.  As part of our continuing mission to provide you with exceptional heart care, we have created designated Provider Care Teams.  These Care Teams include your primary Cardiologist (physician) and Advanced Practice Providers (APPs -  Physician Assistants and Nurse Practitioners) who all work together to provide you with the care you need, when you need it.  We recommend signing up for the patient portal called "MyChart".  Sign up information is provided on this After Visit Summary.  MyChart is used to connect with patients for Virtual Visits (Telemedicine).  Patients are able to view lab/test results, encounter notes, upcoming appointments, etc.  Non-urgent messages can be sent to your provider as well.   To learn more about what you can do with MyChart, go to [email or web address].    Your next appointment:   6 month(s)  The format for your next appointment:   In Person  Provider:   Norman Herrlich   Other Instructions

## 2020-04-18 ENCOUNTER — Telehealth: Payer: Self-pay

## 2020-04-18 LAB — TSH+T4F+T3FREE
Free T4: 1.67 ng/dL (ref 0.82–1.77)
T3, Free: 2.5 pg/mL (ref 2.0–4.4)
TSH: 1.56 u[IU]/mL (ref 0.450–4.500)

## 2020-04-18 LAB — COMPREHENSIVE METABOLIC PANEL
ALT: 24 IU/L (ref 0–44)
AST: 22 IU/L (ref 0–40)
Albumin/Globulin Ratio: 0.9 — ABNORMAL LOW (ref 1.2–2.2)
Albumin: 3.9 g/dL (ref 3.8–4.9)
Alkaline Phosphatase: 61 IU/L (ref 44–121)
BUN/Creatinine Ratio: 16 (ref 10–24)
BUN: 28 mg/dL — ABNORMAL HIGH (ref 8–27)
Bilirubin Total: 0.5 mg/dL (ref 0.0–1.2)
CO2: 22 mmol/L (ref 20–29)
Calcium: 9.4 mg/dL (ref 8.6–10.2)
Chloride: 103 mmol/L (ref 96–106)
Creatinine, Ser: 1.77 mg/dL — ABNORMAL HIGH (ref 0.76–1.27)
GFR calc Af Amer: 47 mL/min/{1.73_m2} — ABNORMAL LOW (ref >59)
GFR calc non Af Amer: 41 mL/min/{1.73_m2} — ABNORMAL LOW (ref >59)
Globulin, Total: 4.3 g/dL (ref 1.5–4.5)
Glucose: 99 mg/dL (ref 65–99)
Potassium: 4.6 mmol/L (ref 3.5–5.2)
Sodium: 138 mmol/L (ref 134–144)
Total Protein: 8.2 g/dL (ref 6.0–8.5)

## 2020-04-18 NOTE — Telephone Encounter (Signed)
-----   Message from Baldo Daub, MD sent at 04/18/2020  7:53 AM EST ----- I am very pleased to see these had a significant improvement in his kidney function and the blood tests for amiodarone for liver and thyroid are normal.

## 2020-04-18 NOTE — Telephone Encounter (Signed)
    Pt would like to speak with Joe Klein again, he said when he woke up this morning, he feels like he has a fever, he has chills and took covid test today. He tried calling pcp but they are close today, he said this is the only way he can get somebody to ask what he can do. He doesn't feel well

## 2020-04-18 NOTE — Telephone Encounter (Signed)
Spoke to the patient just now and let him know that he should go to urgent care to be evaluated. He verbalizes understanding and thanks me for calling back.

## 2020-04-18 NOTE — Telephone Encounter (Signed)
Spoke with patient regarding results and recommendation.  Patient verbalizes understanding and is agreeable to plan of care. Advised patient to call back with any issues or concerns.  

## 2020-05-07 ENCOUNTER — Ambulatory Visit (INDEPENDENT_AMBULATORY_CARE_PROVIDER_SITE_OTHER): Payer: Medicaid Other

## 2020-05-07 ENCOUNTER — Other Ambulatory Visit: Payer: Self-pay

## 2020-05-07 DIAGNOSIS — Z7901 Long term (current) use of anticoagulants: Secondary | ICD-10-CM

## 2020-05-07 DIAGNOSIS — Z5181 Encounter for therapeutic drug level monitoring: Secondary | ICD-10-CM

## 2020-05-07 DIAGNOSIS — Q231 Congenital insufficiency of aortic valve: Secondary | ICD-10-CM

## 2020-05-07 DIAGNOSIS — I480 Paroxysmal atrial fibrillation: Secondary | ICD-10-CM | POA: Diagnosis not present

## 2020-05-07 LAB — POCT INR: INR: 1.7 — AB (ref 2.0–3.0)

## 2020-05-07 NOTE — Patient Instructions (Signed)
Continue warfarin 1 tablet daily except none on Sundays.  Recheck in 4 weeks.  Call Coumadin Clinic with any medicaiton changes or up coming procedures. Coumadin Clinic # [phone]

## 2020-05-17 ENCOUNTER — Telehealth: Payer: Self-pay

## 2020-05-17 NOTE — Telephone Encounter (Signed)
CMM stated no PA is needed for Amiodarone.

## 2020-05-17 NOTE — Telephone Encounter (Signed)
PA started on [facility] for Amiodarone. Key  [id number]

## 2020-05-20 ENCOUNTER — Other Ambulatory Visit: Payer: Self-pay | Admitting: Physician Assistant

## 2020-06-04 ENCOUNTER — Other Ambulatory Visit: Payer: Self-pay

## 2020-06-04 ENCOUNTER — Ambulatory Visit (INDEPENDENT_AMBULATORY_CARE_PROVIDER_SITE_OTHER): Payer: Medicaid Other

## 2020-06-04 DIAGNOSIS — Z7901 Long term (current) use of anticoagulants: Secondary | ICD-10-CM

## 2020-06-04 DIAGNOSIS — I480 Paroxysmal atrial fibrillation: Secondary | ICD-10-CM

## 2020-06-04 DIAGNOSIS — Z5181 Encounter for therapeutic drug level monitoring: Secondary | ICD-10-CM

## 2020-06-04 DIAGNOSIS — Q231 Congenital insufficiency of aortic valve: Secondary | ICD-10-CM

## 2020-06-04 LAB — POCT INR: INR: 1.9 — AB (ref 2.0–3.0)

## 2020-06-04 NOTE — Patient Instructions (Signed)
Continue warfarin 1 tablet daily except none on Sundays.  Recheck in 6 weeks.  Call Coumadin Clinic with any medicaiton changes or up coming procedures. Coumadin Clinic # [phone]

## 2020-07-16 ENCOUNTER — Ambulatory Visit (INDEPENDENT_AMBULATORY_CARE_PROVIDER_SITE_OTHER): Payer: Medicaid Other

## 2020-07-16 ENCOUNTER — Other Ambulatory Visit: Payer: Self-pay

## 2020-07-16 DIAGNOSIS — Q231 Congenital insufficiency of aortic valve: Secondary | ICD-10-CM

## 2020-07-16 DIAGNOSIS — Z7901 Long term (current) use of anticoagulants: Secondary | ICD-10-CM

## 2020-07-16 DIAGNOSIS — I480 Paroxysmal atrial fibrillation: Secondary | ICD-10-CM

## 2020-07-16 DIAGNOSIS — Z5181 Encounter for therapeutic drug level monitoring: Secondary | ICD-10-CM

## 2020-07-16 LAB — POCT INR: INR: 1.8 — AB (ref 2.0–3.0)

## 2020-07-16 MED ORDER — WARFARIN SODIUM 2.5 MG PO TABS: ORAL_TABLET | ORAL | 1 refills | Status: DC

## 2020-07-16 NOTE — Patient Instructions (Signed)
Continue warfarin 1 tablet daily except none on Sundays.  Recheck in 6 weeks.  Call Coumadin Clinic with any medicaiton changes or up coming procedures. [facility] # [phone]  

## 2020-08-27 ENCOUNTER — Ambulatory Visit (INDEPENDENT_AMBULATORY_CARE_PROVIDER_SITE_OTHER): Payer: Medicaid Other

## 2020-08-27 ENCOUNTER — Other Ambulatory Visit: Payer: Self-pay

## 2020-08-27 DIAGNOSIS — Z7901 Long term (current) use of anticoagulants: Secondary | ICD-10-CM | POA: Diagnosis not present

## 2020-08-27 DIAGNOSIS — I480 Paroxysmal atrial fibrillation: Secondary | ICD-10-CM

## 2020-08-27 DIAGNOSIS — Q231 Congenital insufficiency of aortic valve: Secondary | ICD-10-CM

## 2020-08-27 DIAGNOSIS — Z5181 Encounter for therapeutic drug level monitoring: Secondary | ICD-10-CM

## 2020-08-27 LAB — POCT INR: INR: 1.8 — AB (ref 2.0–3.0)

## 2020-08-27 NOTE — Patient Instructions (Signed)
Continue warfarin 1 tablet daily except none on Sundays.  Recheck in 6 weeks.  Call Coumadin Clinic with any medicaiton changes or up coming procedures. Coumadin Clinic # [phone]

## 2020-09-17 ENCOUNTER — Other Ambulatory Visit: Payer: Self-pay

## 2020-09-17 DIAGNOSIS — Z952 Presence of prosthetic heart valve: Secondary | ICD-10-CM

## 2020-09-17 DIAGNOSIS — I10 Essential (primary) hypertension: Secondary | ICD-10-CM

## 2020-09-17 MED ORDER — LABETALOL HCL 100 MG PO TABS: 100.0000 mg | ORAL_TABLET | Freq: Two times a day (BID) | ORAL | 2 refills | Status: DC

## 2020-10-08 ENCOUNTER — Ambulatory Visit: Payer: Medicaid Other

## 2020-10-08 ENCOUNTER — Other Ambulatory Visit: Payer: Self-pay

## 2020-10-08 DIAGNOSIS — Z5181 Encounter for therapeutic drug level monitoring: Secondary | ICD-10-CM

## 2020-10-08 DIAGNOSIS — Z7901 Long term (current) use of anticoagulants: Secondary | ICD-10-CM

## 2020-10-08 DIAGNOSIS — Q231 Congenital insufficiency of aortic valve: Secondary | ICD-10-CM

## 2020-10-08 DIAGNOSIS — I480 Paroxysmal atrial fibrillation: Secondary | ICD-10-CM

## 2020-10-08 LAB — POCT INR: INR: 1.7 — AB (ref 2.0–3.0)

## 2020-10-08 NOTE — Patient Instructions (Signed)
Continue warfarin 1 tablet daily except none on Sundays.  Recheck in 6 weeks.  Call Coumadin Clinic with any medicaiton changes or up coming procedures. Coumadin Clinic # [phone]

## 2020-10-29 ENCOUNTER — Other Ambulatory Visit: Payer: Self-pay | Admitting: Cardiology

## 2020-11-04 NOTE — Progress Notes (Signed)
Cardiology Office Note:    Date:  11/05/2020   ID:  Joe Klein, DOB [birth year], MRN [id number]  PCP:  Crist Fat, MD  Cardiologist:  Norman Herrlich, MD    Referring MD: Leonia Reader, Barbara Cower, MD    ASSESSMENT:    1. S/P AVR (aortic valve replacement)   2. Paroxysmal atrial fibrillation (HCC)   3. On amiodarone therapy   4. Long term (current) use of anticoagulants   5. 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 Dyslipidemia    PLAN:    In order of problems listed above:  Joe Klein continues to do well fully recovered from his surgical aortic valve replacement and repair ascending aortic aneurysm New York Heart Association class I and will continue current medical treatment including aspirin warfarin for an INR of 2-2.5 and his antihypertensive agents. Maintaining sinus rhythm continue low-dose amiodarone check thyroid liver function BP at target continue his combination of labetalol hydralazine and loop diuretic recheck renal function potassium Continue statin check lipid profile   Next appointment: 6 months   Medication Adjustments/Labs and Tests Ordered: Current medicines are reviewed at length with the patient today.  Concerns regarding medicines are outlined above.  Orders Placed This Encounter  Procedures   Comprehensive metabolic panel   Lipid panel   TSH+T4F+T3Free   EKG 12-Lead    No orders of the defined types were placed in this encounter.   Chief Complaint  Patient presents with   Follow-up   Atrial Fibrillation    On amiodarone     History of Present Illness:    Joe Klein is a 60 y.o. male with a hx of heart failure bicuspid aortic valve aneurysmal ascending aorta resistant hypertension chronic kidney disease stage III and combined systolic and diastolic heart failure or underwent aortic valve replacement with a #23 On-X  mechanical valve and repair thoracic ascending aorta with a Heema shield Dacron proximal and distal Hemi arch reconstruction Maze procedure and clipping at the atrial appendage 12/16/2018.  He was last seen 04/17/2020.  Compliance with diet, lifestyle and medications: yes He continues to do well no episodes of atrial fibrillation or side effects from his statin muscle pain or weakness or amiodarone. Very physically active New York Heart Association class I no edema shortness of breath chest pain palpitation or syncope. He has had no bleeding from his anticoagulant and aspirin.   He is followed in our anticoagulation clinic and his INRs have been in range for his valve. Component Ref Range & Units 3 wk ago 2 mo ago 3 mo ago 5 mo ago 6 mo ago 7 mo ago 9 mo ago  INR 2.0 - 3.0 1.7 Abnormal   1.8 Abnormal   1.8 Abnormal   1.9 Abnormal   1.7 Abnormal   2.0  2.0   08/31/2019 showed normalization of ejection fraction 65 to 70% mild concentric LVH normal left ventricular size function and mildly elevated pulmonary artery pressure trivial mitral regurgitation and normal mechanical AVR function.  IMPRESSIONS   1. Left ventricular ejection fraction, by estimation, is 65 to 70%. The  left ventricle has normal function. The left ventricle has no regional  wall motion abnormalities. There is moderate concentric left ventricular  hypertrophy. Left ventricular  diastolic parameters are consistent with Grade III diastolic dysfunction  (restrictive).   2. Right ventricular systolic function is normal. The right ventricular  size is normal.  There is mildly elevated pulmonary artery systolic  pressure.   3. The mitral valve is normal in structure. Trivial mitral valve  regurgitation. No evidence of mitral stenosis.   4. Replacement of ascending aorta with super coronary graft implantation  (28 mm Hemashield platinum dacryon) proximally and a distal hemi-arch  reconstruction under hypothermic circulatory  arrest (17 minutes with 15  minutes of antegrade cerebral  perfusion), aortic valve replacement (23 mm On-X mechanical valve),  bilateral pulmonary vein isolation and left atrial appendage clipping. The  aortic valve is normal in structure. Aortic valve regurgitation 2 trivial  jets. No aortic stenosis is present.  There is a 23 mm On X On X valve present in the aortic position. Procedure  Date: 12/17/2018. Echo findings are consistent with normal structure and  function of the aortic valve prosthesis.   5. The inferior vena cava is normal in size with greater than 50%  respiratory variability, suggesting right atrial pressure of 3 mmHg.  Past Medical History:  Diagnosis Date   AAA (abdominal aortic aneurysm) (HCC)    Aortic stenosis    CHF (congestive heart failure) (HCC)    Chronic kidney disease    stage 3   Dysrhythmia    A-fib   Hypertension     Past Surgical History:  Procedure Laterality Date   AORTIC VALVE REPLACEMENT N/A 12/16/2018   Procedure: AORTIC VALVE REPLACEMENT (AVR);  Surgeon: Linden Dolin, MD;  Location: [facility] OR;  Service: Open Heart Surgery;  Laterality: N/A;   CARDIOVERSION N/A 11/21/2018   Procedure: CARDIOVERSION (CATH LAB);  Surgeon: Marinus Maw, MD;  Location: [facility] INVASIVE CV LAB;  Service: Cardiovascular;  Laterality: N/A;   CLIPPING OF ATRIAL APPENDAGE Left 12/16/2018   Procedure: CLIPPING OF ATRIAL APPENDAGE;  Surgeon: Linden Dolin, MD;  Location: [facility] OR;  Service: Open Heart Surgery;  Laterality: Left;  Appendage clipping with AtriCure AtriClip 50.   MANDIBLE FRACTURE SURGERY     MAZE N/A 12/16/2018   Procedure: MAZE;  Surgeon: Linden Dolin, MD;  Location: [facility] OR;  Service: Open Heart Surgery;  Laterality: N/A;   RIGHT/LEFT HEART CATH AND CORONARY ANGIOGRAPHY N/A 11/18/2018   Procedure: RIGHT/LEFT HEART CATH AND CORONARY ANGIOGRAPHY;  Surgeon: Corky Crafts, MD;  Location: [facility] INVASIVE CV LAB;  Service: Cardiovascular;  Laterality: N/A;    TEE WITHOUT CARDIOVERSION N/A 12/16/2018   Procedure: TRANSESOPHAGEAL ECHOCARDIOGRAM (TEE);  Surgeon: Linden Dolin, MD;  Location: [facility] OR;  Service: Open Heart Surgery;  Laterality: N/A;   THORACIC AORTIC ANEURYSM REPAIR N/A 12/16/2018   Procedure: THORACIC ASCENDING ANEURYSM REPAIR;  Surgeon: Linden Dolin, MD;  Location: [facility] OR;  Service: Open Heart Surgery;  Laterality: N/A;   TONSILLECTOMY      Current Medications: Current Meds  Medication Sig   amiodarone (PACERONE) 100 MG tablet Take 100 mg by mouth daily.   aspirin 81 MG EC tablet Take 81 mg by mouth daily.    atorvastatin (LIPITOR) 40 MG tablet Take 1 tablet by mouth once daily   furosemide (LASIX) 20 MG tablet Take 1 tablet by mouth once or twice weekly.   hydrALAZINE (APRESOLINE) 25 MG tablet Take 3 tablets (75 mg total) by mouth in the morning and at bedtime.   labetalol (NORMODYNE) 100 MG tablet Take 1 tablet (100 mg total) by mouth 2 (two) times daily.   warfarin (COUMADIN) 2.5 MG tablet Take 2.5 mg by mouth daily. 6 days a week     Allergies:  Patient has no known allergies.   Social History   Socioeconomic History   Marital status: Single    Spouse name: Not on file   Number of children: Not on file   Years of education: Not on file   Highest education level: Not on file  Occupational History   Not on file  Tobacco Use   Smoking status: Never   Smokeless tobacco: Never  Vaping Use   Vaping Use: Never used  Substance and Sexual Activity   Alcohol use: Never   Drug use: Never   Sexual activity: Not on file  Other Topics Concern   Not on file  Social History Narrative   Not on file   Social Determinants of Health   Financial Resource Strain: Not on file  Food Insecurity: Not on file  Transportation Needs: Not on file  Physical Activity: Not on file  Stress: Not on file  Social Connections: Not on file     Family History: The patient's family history includes Cancer in his father; Heart  disease in his mother; Hypertension in his brother and mother. ROS:   Please see the history of present illness.    All other systems reviewed and are negative.  EKGs/Labs/Other Studies Reviewed:    The following studies were reviewed today:  EKG:  EKG ordered today and personally reviewed.  The ekg ordered today demonstrates sinus rhythm left bundle branch block QRS duration 144 ms  Recent Labs: 04/17/2020: ALT 24; BUN 28; Creatinine, Ser 1.77; Potassium 4.6; Sodium 138; TSH 1.560  Recent Lipid Panel No results found for: CHOL, TRIG, HDL, CHOLHDL, VLDL, LDLCALC, LDLDIRECT  Physical Exam:    VS:  BP (!) 150/80 (BP Location: Right Arm, Patient Position: Sitting, Cuff Size: Normal)   Pulse 60   Ht 6' (1.829 m)   Wt 189 lb (85.7 kg)   SpO2 96%   BMI 25.63 kg/m     Wt Readings from Last 3 Encounters:  11/05/20 189 lb (85.7 kg)  04/17/20 182 lb (82.6 kg)  10/27/19 175 lb (79.4 kg)     GEN:  Well nourished, well developed in no acute distress HEENT: Normal NECK: No JVD; No carotid bruits LYMPHATICS: No lymphadenopathy CARDIAC: Sharp closing sounds AVR no murmur RRR, no rubs, gallops RESPIRATORY:  Clear to auscultation without rales, wheezing or rhonchi  ABDOMEN: Soft, non-tender, non-distended MUSCULOSKELETAL:  No edema; No deformity  SKIN: Warm and dry NEUROLOGIC:  Alert and oriented x 3 PSYCHIATRIC:  Normal affect    Signed, Norman Herrlich, MD  11/05/2020 3:18 PM    [facility]

## 2020-11-05 ENCOUNTER — Other Ambulatory Visit: Payer: Self-pay

## 2020-11-05 ENCOUNTER — Ambulatory Visit: Payer: Medicaid Other | Admitting: Cardiology

## 2020-11-05 ENCOUNTER — Encounter: Payer: Self-pay | Admitting: Cardiology

## 2020-11-05 VITALS — BP 138/78 | HR 60 | Ht 72.0 in | Wt 189.0 lb

## 2020-11-05 DIAGNOSIS — I480 Paroxysmal atrial fibrillation: Secondary | ICD-10-CM | POA: Diagnosis not present

## 2020-11-05 DIAGNOSIS — Z952 Presence of prosthetic heart valve: Secondary | ICD-10-CM

## 2020-11-05 DIAGNOSIS — E785 Hyperlipidemia, unspecified: Secondary | ICD-10-CM

## 2020-11-05 DIAGNOSIS — Z7901 Long term (current) use of anticoagulants: Secondary | ICD-10-CM

## 2020-11-05 DIAGNOSIS — I5042 Chronic combined systolic (congestive) and diastolic (congestive) heart failure: Secondary | ICD-10-CM

## 2020-11-05 DIAGNOSIS — N1832 Chronic kidney disease, stage 3b: Secondary | ICD-10-CM

## 2020-11-05 DIAGNOSIS — Z79899 Other long term (current) drug therapy: Secondary | ICD-10-CM | POA: Diagnosis not present

## 2020-11-05 DIAGNOSIS — I130 Hypertensive heart and chronic kidney disease with heart failure and stage 1 through stage 4 chronic kidney disease, or unspecified chronic kidney disease: Secondary | ICD-10-CM

## 2020-11-05 NOTE — Patient Instructions (Signed)
Medication Instructions:  Your physician recommends that you continue on your current medications as directed. Please refer to the Current Medication list given to you today.  *If you need a refill on your cardiac medications before your next appointment, please call your pharmacy*   Lab Work: Your physician recommends that you return for lab work in: TODAY CMP, Lipids, TSH, T3, T4 If you have labs (blood work) drawn today and your tests are completely normal, you will receive your results only by: . MyChart Message (if you have MyChart) OR . A paper copy in the mail If you have any lab test that is abnormal or we need to change your treatment, we will call you to review the results.   Testing/Procedures: None   Follow-Up: At [facility], you and your health needs are our priority.  As part of our continuing mission to provide you with exceptional heart care, we have created designated Provider Care Teams.  These Care Teams include your primary Cardiologist (physician) and Advanced Practice Providers (APPs -  Physician Assistants and Nurse Practitioners) who all work together to provide you with the care you need, when you need it.  We recommend signing up for the patient portal called "MyChart".  Sign up information is provided on this After Visit Summary.  MyChart is used to connect with patients for Virtual Visits (Telemedicine).  Patients are able to view lab/test results, encounter notes, upcoming appointments, etc.  Non-urgent messages can be sent to your provider as well.   To learn more about what you can do with MyChart, go to [email or web address].    Your next appointment:   6 month(s)  The format for your next appointment:   In Person  Provider:   Brian Munley, MD   Other Instructions   

## 2020-11-06 ENCOUNTER — Telehealth: Payer: Self-pay

## 2020-11-06 LAB — LIPID PANEL
Chol/HDL Ratio: 3.8 ratio (ref 0.0–5.0)
Cholesterol, Total: 126 mg/dL (ref 100–199)
HDL: 33 mg/dL — ABNORMAL LOW (ref >39)
LDL Chol Calc (NIH): 63 mg/dL (ref 0–99)
Triglycerides: 180 mg/dL — ABNORMAL HIGH (ref 0–149)
VLDL Cholesterol Cal: 30 mg/dL (ref 5–40)

## 2020-11-06 LAB — COMPREHENSIVE METABOLIC PANEL
ALT: 23 IU/L (ref 0–44)
AST: 22 IU/L (ref 0–40)
Albumin/Globulin Ratio: 1.3 (ref 1.2–2.2)
Albumin: 4.5 g/dL (ref 3.8–4.8)
Alkaline Phosphatase: 46 IU/L (ref 44–121)
BUN/Creatinine Ratio: 17 (ref 10–24)
BUN: 35 mg/dL — ABNORMAL HIGH (ref 8–27)
Bilirubin Total: 0.4 mg/dL (ref 0.0–1.2)
CO2: 21 mmol/L (ref 20–29)
Calcium: 9.6 mg/dL (ref 8.6–10.2)
Chloride: 104 mmol/L (ref 96–106)
Creatinine, Ser: 2.01 mg/dL — ABNORMAL HIGH (ref 0.76–1.27)
Globulin, Total: 3.6 g/dL (ref 1.5–4.5)
Glucose: 115 mg/dL — ABNORMAL HIGH (ref 65–99)
Potassium: 4.3 mmol/L (ref 3.5–5.2)
Sodium: 140 mmol/L (ref 134–144)
Total Protein: 8.1 g/dL (ref 6.0–8.5)
eGFR: 37 mL/min/{1.73_m2} — ABNORMAL LOW (ref >59)

## 2020-11-06 LAB — TSH+T4F+T3FREE
Free T4: 1.49 ng/dL (ref 0.82–1.77)
T3, Free: 2.3 pg/mL (ref 2.0–4.4)
TSH: 1.76 u[IU]/mL (ref 0.450–4.500)

## 2020-11-06 NOTE — Telephone Encounter (Signed)
Spoke with patient regarding results and recommendation.  Patient verbalizes understanding and is agreeable to plan of care. Advised patient to call back with any issues or concerns.  

## 2020-11-06 NOTE — Telephone Encounter (Signed)
-----   Message from Baldo Daub, MD sent at 11/06/2020  8:00 AM EDT ----- Normal or stable result  No changes stable CKD.

## 2020-11-19 ENCOUNTER — Ambulatory Visit (INDEPENDENT_AMBULATORY_CARE_PROVIDER_SITE_OTHER): Payer: Medicaid Other

## 2020-11-19 ENCOUNTER — Other Ambulatory Visit: Payer: Self-pay

## 2020-11-19 DIAGNOSIS — I480 Paroxysmal atrial fibrillation: Secondary | ICD-10-CM | POA: Diagnosis not present

## 2020-11-19 DIAGNOSIS — Z5181 Encounter for therapeutic drug level monitoring: Secondary | ICD-10-CM

## 2020-11-19 DIAGNOSIS — Z7901 Long term (current) use of anticoagulants: Secondary | ICD-10-CM

## 2020-11-19 DIAGNOSIS — Q231 Congenital insufficiency of aortic valve: Secondary | ICD-10-CM

## 2020-11-19 LAB — POCT INR: INR: 1.8 — AB (ref 2.0–3.0)

## 2020-11-19 NOTE — Patient Instructions (Signed)
Continue warfarin 1 tablet daily except none on Sundays.  Recheck in 6 weeks.  Call Coumadin Clinic with any medicaiton changes or up coming procedures. Coumadin Clinic # [phone]

## 2020-12-31 ENCOUNTER — Ambulatory Visit: Payer: Medicaid Other

## 2020-12-31 ENCOUNTER — Other Ambulatory Visit: Payer: Self-pay

## 2020-12-31 DIAGNOSIS — Z7901 Long term (current) use of anticoagulants: Secondary | ICD-10-CM | POA: Diagnosis not present

## 2020-12-31 DIAGNOSIS — Q231 Congenital insufficiency of aortic valve: Secondary | ICD-10-CM

## 2020-12-31 DIAGNOSIS — I480 Paroxysmal atrial fibrillation: Secondary | ICD-10-CM | POA: Diagnosis not present

## 2020-12-31 DIAGNOSIS — Z5181 Encounter for therapeutic drug level monitoring: Secondary | ICD-10-CM

## 2020-12-31 LAB — POCT INR: INR: 1.8 — AB (ref 2.0–3.0)

## 2020-12-31 NOTE — Patient Instructions (Signed)
Continue warfarin 1 tablet daily except none on Sundays.  Recheck in 6 weeks.  Call Coumadin Clinic with any medicaiton changes or up coming procedures. Coumadin Clinic # [phone]  

## 2021-01-08 MED ORDER — AMIODARONE HCL 100 MG PO TABS: 100.0000 mg | ORAL_TABLET | Freq: Every day | ORAL | 3 refills | Status: DC

## 2021-02-11 ENCOUNTER — Ambulatory Visit: Payer: Medicaid Other

## 2021-02-11 ENCOUNTER — Other Ambulatory Visit: Payer: Self-pay

## 2021-02-11 DIAGNOSIS — Z5181 Encounter for therapeutic drug level monitoring: Secondary | ICD-10-CM | POA: Diagnosis not present

## 2021-02-11 DIAGNOSIS — I480 Paroxysmal atrial fibrillation: Secondary | ICD-10-CM | POA: Diagnosis not present

## 2021-02-11 LAB — POCT INR: INR: 1.9 — AB (ref 2.0–3.0)

## 2021-02-11 NOTE — Patient Instructions (Signed)
Description   Continue warfarin 1 tablet daily except none on Sundays. Recheck in 6 weeks.  Call Coumadin Clinic with any medicaiton changes or up coming procedures. Coumadin Clinic # [phone]      

## 2021-03-21 ENCOUNTER — Encounter: Payer: Self-pay | Admitting: Cardiology

## 2021-03-24 MED ORDER — HYDRALAZINE HCL 25 MG PO TABS: 75.0000 mg | ORAL_TABLET | Freq: Two times a day (BID) | ORAL | 3 refills | Status: DC

## 2021-03-25 ENCOUNTER — Ambulatory Visit: Payer: Medicaid Other

## 2021-03-25 ENCOUNTER — Other Ambulatory Visit: Payer: Self-pay

## 2021-03-25 DIAGNOSIS — Z5181 Encounter for therapeutic drug level monitoring: Secondary | ICD-10-CM | POA: Diagnosis not present

## 2021-03-25 DIAGNOSIS — I480 Paroxysmal atrial fibrillation: Secondary | ICD-10-CM

## 2021-03-25 LAB — POCT INR: INR: 1.6 — AB (ref 2.0–3.0)

## 2021-03-25 NOTE — Patient Instructions (Signed)
Description   Continue warfarin 1 tablet daily except none on Sundays. Recheck in 6 weeks.  Call Coumadin Clinic with any medicaiton changes or up coming procedures. Coumadin Clinic # [phone]      

## 2021-04-25 ENCOUNTER — Encounter: Payer: Self-pay | Admitting: Cardiology

## 2021-04-28 ENCOUNTER — Other Ambulatory Visit: Payer: Self-pay | Admitting: Cardiology

## 2021-04-29 NOTE — Telephone Encounter (Signed)
Atorvastatin 40 mg # 90 x 1 refill sent to   [facility] [address] [place] - [address]

## 2021-05-06 ENCOUNTER — Ambulatory Visit (INDEPENDENT_AMBULATORY_CARE_PROVIDER_SITE_OTHER): Payer: Medicaid Other

## 2021-05-06 ENCOUNTER — Other Ambulatory Visit: Payer: Self-pay

## 2021-05-06 DIAGNOSIS — I480 Paroxysmal atrial fibrillation: Secondary | ICD-10-CM | POA: Diagnosis not present

## 2021-05-06 DIAGNOSIS — Z5181 Encounter for therapeutic drug level monitoring: Secondary | ICD-10-CM | POA: Diagnosis not present

## 2021-05-06 LAB — POCT INR: INR: 1.8 — AB (ref 2.0–3.0)

## 2021-05-06 NOTE — Patient Instructions (Signed)
Description   Continue warfarin 1 tablet daily except none on Sundays. Recheck in 6 weeks.  Call Coumadin Clinic with any medicaiton changes or up coming procedures. Coumadin Clinic # [phone]      

## 2021-06-03 ENCOUNTER — Encounter: Payer: Self-pay | Admitting: Cardiology

## 2021-06-03 DIAGNOSIS — I10 Essential (primary) hypertension: Secondary | ICD-10-CM

## 2021-06-03 DIAGNOSIS — Z952 Presence of prosthetic heart valve: Secondary | ICD-10-CM

## 2021-06-03 MED ORDER — LABETALOL HCL 100 MG PO TABS: 100.0000 mg | ORAL_TABLET | Freq: Two times a day (BID) | ORAL | 2 refills | Status: DC

## 2021-06-17 ENCOUNTER — Other Ambulatory Visit: Payer: Self-pay

## 2021-06-17 ENCOUNTER — Ambulatory Visit: Payer: Medicaid Other

## 2021-06-17 DIAGNOSIS — Z5181 Encounter for therapeutic drug level monitoring: Secondary | ICD-10-CM

## 2021-06-17 DIAGNOSIS — I480 Paroxysmal atrial fibrillation: Secondary | ICD-10-CM | POA: Diagnosis not present

## 2021-06-17 LAB — POCT INR: INR: 1.6 — AB (ref 2.0–3.0)

## 2021-06-17 NOTE — Patient Instructions (Signed)
Description   Continue warfarin 1 tablet daily except none on Sundays. Recheck in 6 weeks.  Call Coumadin Clinic with any medicaiton changes or up coming procedures. Coumadin Clinic # [phone]      

## 2021-06-19 ENCOUNTER — Other Ambulatory Visit: Payer: Self-pay

## 2021-06-19 ENCOUNTER — Encounter: Payer: Self-pay | Admitting: Cardiology

## 2021-06-19 ENCOUNTER — Ambulatory Visit: Payer: Medicaid Other | Admitting: Cardiology

## 2021-06-19 VITALS — BP 160/78 | HR 58 | Ht 72.0 in | Wt 186.6 lb

## 2021-06-19 DIAGNOSIS — N183 Chronic kidney disease, stage 3 unspecified: Secondary | ICD-10-CM | POA: Diagnosis not present

## 2021-06-19 DIAGNOSIS — Z79899 Other long term (current) drug therapy: Secondary | ICD-10-CM | POA: Diagnosis not present

## 2021-06-19 DIAGNOSIS — Z952 Presence of prosthetic heart valve: Secondary | ICD-10-CM | POA: Diagnosis not present

## 2021-06-19 DIAGNOSIS — Z7901 Long term (current) use of anticoagulants: Secondary | ICD-10-CM

## 2021-06-19 DIAGNOSIS — I5042 Chronic combined systolic (congestive) and diastolic (congestive) heart failure: Secondary | ICD-10-CM | POA: Diagnosis not present

## 2021-06-19 DIAGNOSIS — I130 Hypertensive heart and chronic kidney disease with heart failure and stage 1 through stage 4 chronic kidney disease, or unspecified chronic kidney disease: Secondary | ICD-10-CM

## 2021-06-19 DIAGNOSIS — I480 Paroxysmal atrial fibrillation: Secondary | ICD-10-CM

## 2021-06-19 NOTE — Progress Notes (Signed)
Cardiology Office Note:    Date:  06/19/2021   ID:  Joe Klein, DOB 06/05/1960, MRN [id number]  PCP:  Joe Roger, MD  Cardiologist:  Joe More, MD    Referring MD: Joe Klein, Joe Cornea, MD    ASSESSMENT:    1. S/P AVR (aortic valve replacement)   2. 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 Paroxysmal atrial fibrillation ([place])   4. On amiodarone therapy   5. High risk medication use   6. Long term (current) use of anticoagulants    PLAN:    In order of problems listed above:  Joe Klein continues to do well following his aortic valve replacement and thoracic aortic surgery full recovery [place] Heart Association class I continue his warfarin along with low-dose aspirin. Stable BP at target continue labetalol hydralazine as needed diuretic Maintaining sinus rhythm continue low-dose amiodarone we will check thyroid liver Continue amiodarone Continue statin check lipid profile   Next appointment: 9 months   Medication Adjustments/Labs and Tests Ordered: Current medicines are reviewed at length with the patient today.  Concerns regarding medicines are outlined above.  Orders Placed This Encounter  Procedures   Comprehensive metabolic panel   [id number]   EKG 12-Lead   No orders of the defined types were placed in this encounter.   Chief Complaint  Patient presents with   Follow-up   Atrial Fibrillation    History of Present Illness:    Joe Klein is a 61 y.o. male with a hx of  heart failure bicuspid aortic valve aneurysmal ascending aorta resistant hypertension chronic kidney disease stage III and combined systolic and diastolic heart failure or underwent aortic valve replacement with a #23 On-X mechanical valve and repair thoracic ascending aorta with a Heema shield Dacron proximal and distal  Hemi arch reconstruction Maze procedure and clipping at the atrial appendage 12/16/2018  last seen 11/05/2020. Compliance with diet, lifestyle and medications: Yes  Is always nice to see Joe Klein in the office he really is engaged in life after surgery fully recovered very vigorous and active  Past Medical History:  Diagnosis Date   AAA (abdominal aortic aneurysm)    Aortic stenosis    CHF (congestive heart failure) ([place])    Chronic kidney disease    stage 3   Dysrhythmia    A-fib   Hypertension     Past Surgical History:  Procedure Laterality Date   AORTIC VALVE REPLACEMENT N/A 12/16/2018   Procedure: AORTIC VALVE REPLACEMENT (AVR);  Surgeon: Joe Olds, MD;  Location: [place];  Service: Open Heart Surgery;  Laterality: N/A;   CARDIOVERSION N/A 11/21/2018   Procedure: CARDIOVERSION (CATH LAB);  Surgeon: Joe Lance, MD;  Location: [facility];  Service: Cardiovascular;  Laterality: N/A;   CLIPPING OF ATRIAL APPENDAGE Left 12/16/2018   Procedure: CLIPPING OF ATRIAL APPENDAGE;  Surgeon: Joe Olds, MD;  Location: [facility];  Service: Open Heart Surgery;  Laterality: Left;  Appendage clipping with AtriCure AtriClip 50.   MANDIBLE FRACTURE SURGERY     MAZE N/A 12/16/2018   Procedure: MAZE;  Surgeon: Joe Olds, MD;  [facility];  Service: Open Heart Surgery;  Laterality: N/A;   RIGHT/LEFT HEART CATH AND CORONARY ANGIOGRAPHY N/A 11/18/2018   Procedure: RIGHT/LEFT HEART CATH AND CORONARY ANGIOGRAPHY;  Surgeon: Joe Booze, MD;  Location: [facility];  Service: Cardiovascular;  Laterality: N/A;   TEE WITHOUT CARDIOVERSION N/A 12/16/2018   Procedure: TRANSESOPHAGEAL ECHOCARDIOGRAM (TEE);  Surgeon: Joe Olds, MD;  Location: [place];  Service: Open Heart Surgery;  Laterality: N/A;   THORACIC AORTIC ANEURYSM REPAIR N/A 12/16/2018   Procedure: THORACIC ASCENDING ANEURYSM REPAIR;  Surgeon: Joe Olds, MD;  Location: [place];  Service: Open  Heart Surgery;  Laterality: N/A;   TONSILLECTOMY      Current Medications: Current Meds  Medication Sig   amiodarone (PACERONE) 100 MG tablet Take 1 tablet (100 mg total) by mouth daily.   aspirin 81 MG EC tablet Take 81 mg by mouth daily.    atorvastatin (LIPITOR) 40 MG tablet Take 1 tablet by mouth once daily   furosemide (LASIX) 20 MG tablet Take 1 tablet by mouth once or twice weekly. (Patient taking differently: Take 1 tablet by mouth once or twice weekly as needed for edema)   hydrALAZINE (APRESOLINE) 25 MG tablet Take 3 tablets (75 mg total) by mouth in the morning and at bedtime.   labetalol (NORMODYNE) 100 MG tablet Take 1 tablet (100 mg total) by mouth 2 (two) times daily.   warfarin (COUMADIN) 2.5 MG tablet Take 2.5 mg by mouth daily. 6 days a week     Allergies:   Patient has no known allergies.   Social History   Socioeconomic History   Marital status: Single    Spouse name: Not on file   Number of children: Not on file   Years of education: Not on file   Highest education level: Not on file  Occupational History   Not on file  Tobacco Use   Smoking status: Never    Passive exposure: Never   Smokeless tobacco: Never  Vaping Use   Vaping Use: Never used  Substance and Sexual Activity   Alcohol use: Never   Drug use: Never   Sexual activity: Not on file  Other Topics Concern   Not on file  Social History Narrative   Not on file   Social Determinants of Health   Financial Resource Strain: Not on file  Food Insecurity: Not on file  Transportation Needs: Not on file  Physical Activity: Not on file  Stress: Not on file  Social Connections: Not on file     Family History: The patient's family history includes Cancer in his father; Heart disease in his mother; Hypertension in his brother and mother. ROS:   Please see the history of present illness.    All other systems reviewed and are negative.  EKGs/Labs/Other Studies Reviewed:    The following  studies were reviewed today: IMPRESSIONS   1. Left ventricular ejection fraction, by estimation, is 65 to 70%. The  left ventricle has normal function. The left ventricle has no regional  wall motion abnormalities. There is moderate concentric left ventricular  hypertrophy. Left ventricular  diastolic parameters are consistent with Grade III diastolic dysfunction  (restrictive).   2. Right ventricular systolic function is normal. The right ventricular  size is normal. There is mildly elevated pulmonary artery systolic  pressure.   3. The mitral valve is normal in structure. Trivial mitral valve  regurgitation. No evidence of mitral stenosis.   4. Replacement of ascending aorta with super coronary graft implantation  (28 mm Hemashield platinum dacryon) proximally and a distal hemi-arch  reconstruction under hypothermic circulatory arrest (17 minutes with 15  minutes of antegrade cerebral  perfusion), aortic valve replacement (23 mm  On-X mechanical valve),  bilateral pulmonary vein isolation and left atrial appendage clipping. The  aortic valve is normal in structure. Aortic valve regurgitation 2 trivial  jets. No aortic stenosis is present.  There is a 23 mm On X On X valve present in the aortic position. Procedure  Date: 12/17/2018. Echo findings are consistent with normal structure and  function of the aortic valve prosthesis.   5. The inferior vena cava is normal in size with greater than 50%  respiratory variability, suggesting right atrial pressure of 3 mmHg.  EKG:  EKG ordered today and personally reviewed.  The ekg ordered today demonstrates sinus rhythm left bundle branch block  Recent Labs: 11/05/2020: ALT 23; BUN 35; Creatinine, Ser 2.01; Potassium 4.3; Sodium 140; TSH 1.760  Recent Lipid Panel    Component Value Date/Time   CHOL 126 11/05/2020 1522   TRIG 180 (H) 11/05/2020 1522   HDL 33 (L) 11/05/2020 1522   CHOLHDL 3.8 11/05/2020 1522   LDLCALC 63 11/05/2020 1522     Physical Exam:    VS:  BP (!) 160/78 (BP Location: Right Arm)    Pulse (!) 58    Ht 6' (1.829 m)    Wt 186 lb 9.6 oz (84.6 kg)    SpO2 97%    BMI 25.31 kg/m     Wt Readings from Last 3 Encounters:  06/19/21 186 lb 9.6 oz (84.6 kg)  11/05/20 189 lb (85.7 kg)  04/17/20 182 lb (82.6 kg)  BP 140/80 right arm  GEN:  Well nourished, well developed in no acute distress HEENT: Normal NECK: No JVD; No carotid bruits LYMPHATICS: No lymphadenopathy CARDIAC: Sharp closing sound RRR, no murmurs, rubs, gallops RESPIRATORY:  Clear to auscultation without rales, wheezing or rhonchi  ABDOMEN: Soft, non-tender, non-distended MUSCULOSKELETAL:  No edema; No deformity  SKIN: Warm and dry NEUROLOGIC:  Alert and oriented x 3 PSYCHIATRIC:  Normal affect    Signed, Joe More, MD  06/19/2021 2:23 PM    [place]

## 2021-06-19 NOTE — Patient Instructions (Signed)
Medication Instructions:  Your physician recommends that you continue on your current medications as directed. Please refer to the Current Medication list given to you today.  *If you need a refill on your cardiac medications before your next appointment, please call your pharmacy*   Lab Work: Your physician recommends that you return for lab work in: TODAY CMP, TSH, T3, T4 If you have labs (blood work) drawn today and your tests are completely normal, you will receive your results only by: MyChart Message (if you have MyChart) OR A paper copy in the mail If you have any lab test that is abnormal or we need to change your treatment, we will call you to review the results.   Testing/Procedures: None   Follow-Up: At [facility], you and your health needs are our priority.  As part of our continuing mission to provide you with exceptional heart care, we have created designated Provider Care Teams.  These Care Teams include your primary Cardiologist (physician) and Advanced Practice Providers (APPs -  Physician Assistants and Nurse Practitioners) who all work together to provide you with the care you need, when you need it.  We recommend signing up for the patient portal called "MyChart".  Sign up information is provided on this After Visit Summary.  MyChart is used to connect with patients for Virtual Visits (Telemedicine).  Patients are able to view lab/test results, encounter notes, upcoming appointments, etc.  Non-urgent messages can be sent to your provider as well.   To learn more about what you can do with MyChart, go to [email or web address].    Your next appointment:   6 month(s)  The format for your next appointment:   In Person  Provider:   Brian Munley, MD   Other Instructions   

## 2021-06-20 ENCOUNTER — Telehealth: Payer: Self-pay

## 2021-06-20 LAB — COMPREHENSIVE METABOLIC PANEL
ALT: 16 IU/L (ref 0–44)
AST: 18 IU/L (ref 0–40)
Albumin/Globulin Ratio: 1.2 (ref 1.2–2.2)
Albumin: 4.3 g/dL (ref 3.8–4.8)
Alkaline Phosphatase: 46 IU/L (ref 44–121)
BUN/Creatinine Ratio: 14 (ref 10–24)
BUN: 30 mg/dL — ABNORMAL HIGH (ref 8–27)
Bilirubin Total: 0.4 mg/dL (ref 0.0–1.2)
CO2: 21 mmol/L (ref 20–29)
Calcium: 9.8 mg/dL (ref 8.6–10.2)
Chloride: 102 mmol/L (ref 96–106)
Creatinine, Ser: 2.08 mg/dL — ABNORMAL HIGH (ref 0.76–1.27)
Globulin, Total: 3.6 g/dL (ref 1.5–4.5)
Glucose: 103 mg/dL — ABNORMAL HIGH (ref 70–99)
Potassium: 4.9 mmol/L (ref 3.5–5.2)
Sodium: 139 mmol/L (ref 134–144)
Total Protein: 7.9 g/dL (ref 6.0–8.5)
eGFR: 36 mL/min/{1.73_m2} — ABNORMAL LOW (ref >59)

## 2021-06-20 LAB — TSH+T4F+T3FREE
Free T4: 1.55 ng/dL (ref 0.82–1.77)
T3, Free: 2.2 pg/mL (ref 2.0–4.4)
TSH: 1.54 u[IU]/mL (ref 0.450–4.500)

## 2021-06-20 LAB — LIPID PANEL
Chol/HDL Ratio: 3.5 ratio (ref 0.0–5.0)
Cholesterol, Total: 121 mg/dL (ref 100–199)
HDL: 35 mg/dL — ABNORMAL LOW (ref >39)
LDL Chol Calc (NIH): 70 mg/dL (ref 0–99)
Triglycerides: 82 mg/dL (ref 0–149)
VLDL Cholesterol Cal: 16 mg/dL (ref 5–40)

## 2021-06-20 NOTE — Telephone Encounter (Signed)
Spoke with patient regarding results and recommendation.  Patient verbalizes understanding and is agreeable to plan of care. Advised patient to call back with any issues or concerns.  

## 2021-06-20 NOTE — Telephone Encounter (Signed)
-----   Message from Baldo Daub, MD sent at 06/20/2021 12:12 PM EST ----- Good result stable kidney function

## 2021-07-29 ENCOUNTER — Ambulatory Visit: Payer: Medicaid Other

## 2021-07-29 ENCOUNTER — Other Ambulatory Visit: Payer: Self-pay

## 2021-07-29 DIAGNOSIS — I480 Paroxysmal atrial fibrillation: Secondary | ICD-10-CM

## 2021-07-29 DIAGNOSIS — Z5181 Encounter for therapeutic drug level monitoring: Secondary | ICD-10-CM

## 2021-07-29 LAB — POCT INR: INR: 1.4 — AB (ref 2.0–3.0)

## 2021-07-29 NOTE — Patient Instructions (Signed)
Description   ?Take 1.5 tablets today and then continue warfarin 1 tablet daily except none on Sundays. ?Recheck in 6 weeks. Call [facility] with any medicaiton changes or up coming procedures. [facility] # [phone] ?  ?   ?

## 2021-09-02 ENCOUNTER — Other Ambulatory Visit: Payer: Self-pay | Admitting: Cardiology

## 2021-09-02 ENCOUNTER — Encounter: Payer: Self-pay | Admitting: Cardiology

## 2021-09-09 ENCOUNTER — Ambulatory Visit: Payer: Medicaid Other

## 2021-09-09 DIAGNOSIS — Z5181 Encounter for therapeutic drug level monitoring: Secondary | ICD-10-CM

## 2021-09-09 DIAGNOSIS — Q231 Congenital insufficiency of aortic valve: Secondary | ICD-10-CM

## 2021-09-09 DIAGNOSIS — I480 Paroxysmal atrial fibrillation: Secondary | ICD-10-CM | POA: Diagnosis not present

## 2021-09-09 DIAGNOSIS — Z7901 Long term (current) use of anticoagulants: Secondary | ICD-10-CM

## 2021-09-09 LAB — POCT INR: INR: 1.7 — AB (ref 2.0–3.0)

## 2021-09-09 NOTE — Patient Instructions (Signed)
Description   Continue warfarin 1 tablet daily except none on Sundays. Recheck in 6 weeks.  Call Coumadin Clinic with any medicaiton changes or up coming procedures. Coumadin Clinic # [phone]      

## 2021-10-20 ENCOUNTER — Other Ambulatory Visit: Payer: Self-pay | Admitting: Cardiology

## 2021-10-20 ENCOUNTER — Other Ambulatory Visit: Payer: Self-pay

## 2021-10-20 MED ORDER — ATORVASTATIN CALCIUM 40 MG PO TABS: 40.0000 mg | ORAL_TABLET | Freq: Every day | ORAL | 3 refills | Status: DC

## 2021-10-21 ENCOUNTER — Ambulatory Visit: Payer: Medicaid Other

## 2021-10-21 DIAGNOSIS — Q2381 Bicuspid aortic valve: Secondary | ICD-10-CM

## 2021-10-21 DIAGNOSIS — Z7901 Long term (current) use of anticoagulants: Secondary | ICD-10-CM | POA: Diagnosis not present

## 2021-10-21 DIAGNOSIS — I480 Paroxysmal atrial fibrillation: Secondary | ICD-10-CM | POA: Diagnosis not present

## 2021-10-21 DIAGNOSIS — Z5181 Encounter for therapeutic drug level monitoring: Secondary | ICD-10-CM

## 2021-10-21 DIAGNOSIS — Q231 Congenital insufficiency of aortic valve: Secondary | ICD-10-CM

## 2021-10-21 LAB — POCT INR: INR: 1.6 — AB (ref 2.0–3.0)

## 2021-10-21 NOTE — Patient Instructions (Signed)
Description   Continue warfarin 1 tablet daily except none on Sundays. Recheck in 6 weeks.  Call Coumadin Clinic with any medicaiton changes or up coming procedures. [facility] # [phone]

## 2021-11-28 ENCOUNTER — Other Ambulatory Visit: Payer: Self-pay | Admitting: Cardiology

## 2021-11-28 DIAGNOSIS — I480 Paroxysmal atrial fibrillation: Secondary | ICD-10-CM

## 2021-11-28 NOTE — Telephone Encounter (Signed)
Prescription refill request received for warfarin Lov: 06/19/21 [facility]) Next INR check: 12/02/21 Warfarin tablet strength: 2.5mg   Appropriate dose and refill sent to requested pharmacy.

## 2021-12-02 ENCOUNTER — Ambulatory Visit (INDEPENDENT_AMBULATORY_CARE_PROVIDER_SITE_OTHER): Payer: Medicaid Other

## 2021-12-02 DIAGNOSIS — Z5181 Encounter for therapeutic drug level monitoring: Secondary | ICD-10-CM | POA: Diagnosis not present

## 2021-12-02 DIAGNOSIS — I480 Paroxysmal atrial fibrillation: Secondary | ICD-10-CM | POA: Diagnosis not present

## 2021-12-02 DIAGNOSIS — Z7901 Long term (current) use of anticoagulants: Secondary | ICD-10-CM | POA: Diagnosis not present

## 2021-12-02 DIAGNOSIS — Q231 Congenital insufficiency of aortic valve: Secondary | ICD-10-CM

## 2021-12-02 LAB — POCT INR: INR: 2.3 (ref 2.0–3.0)

## 2021-12-02 NOTE — Patient Instructions (Signed)
Description   Only take 1/2 tablet today and then continue warfarin 1 tablet daily except none on Sundays. Recheck in 6 weeks.  Call Coumadin Clinic with any medicaiton changes or up coming procedures. [facility] # [phone]

## 2021-12-23 ENCOUNTER — Other Ambulatory Visit: Payer: Self-pay | Admitting: Cardiology

## 2022-01-13 ENCOUNTER — Ambulatory Visit: Payer: Medicaid Other | Attending: Cardiology

## 2022-01-13 ENCOUNTER — Other Ambulatory Visit: Payer: Self-pay

## 2022-01-13 DIAGNOSIS — I480 Paroxysmal atrial fibrillation: Secondary | ICD-10-CM

## 2022-01-13 DIAGNOSIS — Q231 Congenital insufficiency of aortic valve: Secondary | ICD-10-CM

## 2022-01-13 DIAGNOSIS — Z5181 Encounter for therapeutic drug level monitoring: Secondary | ICD-10-CM

## 2022-01-13 DIAGNOSIS — Z7901 Long term (current) use of anticoagulants: Secondary | ICD-10-CM

## 2022-01-13 LAB — POCT INR: INR: 5.5 — AB (ref 2.0–3.0)

## 2022-01-13 MED ORDER — WARFARIN SODIUM 2.5 MG PO TABS: ORAL_TABLET | ORAL | 0 refills | Status: DC

## 2022-01-13 NOTE — Telephone Encounter (Signed)
Prescription refill request received for warfarin Lov: 06/19/21  Next INR check: 01/20/22 Warfarin tablet strength: 2.5mg   Appropriate dose and refill sent to requested pharmacy.

## 2022-01-13 NOTE — Patient Instructions (Signed)
Description   Hold today's dose,  hold tomorrow's dose and only take 0.5 tablet on Thursday and then continue warfarin 1 tablet daily except none on Sundays. Recheck in 1 week.  Call Coumadin Clinic with any medicaiton changes or up coming procedures. [facility] # [phone]

## 2022-01-20 ENCOUNTER — Ambulatory Visit: Payer: Medicaid Other | Attending: Cardiology

## 2022-01-20 DIAGNOSIS — Z5181 Encounter for therapeutic drug level monitoring: Secondary | ICD-10-CM

## 2022-01-20 DIAGNOSIS — I480 Paroxysmal atrial fibrillation: Secondary | ICD-10-CM

## 2022-01-20 DIAGNOSIS — Z7901 Long term (current) use of anticoagulants: Secondary | ICD-10-CM | POA: Diagnosis not present

## 2022-01-20 DIAGNOSIS — Q231 Congenital insufficiency of aortic valve: Secondary | ICD-10-CM | POA: Diagnosis not present

## 2022-01-20 LAB — POCT INR: INR: 2.8 (ref 2.0–3.0)

## 2022-01-20 NOTE — Patient Instructions (Signed)
Description   Hold today's dose and then continue warfarin 1 tablet daily except none on Sundays. Recheck in 1 week.  Call Coumadin Clinic with any medicaiton changes or up coming procedures. [facility] # [phone]

## 2022-01-23 ENCOUNTER — Encounter: Payer: Self-pay | Admitting: Cardiology

## 2022-01-27 ENCOUNTER — Telehealth: Payer: Self-pay | Admitting: Cardiology

## 2022-01-27 ENCOUNTER — Ambulatory Visit: Payer: Medicaid Other

## 2022-01-27 ENCOUNTER — Telehealth: Payer: Self-pay

## 2022-01-27 NOTE — Telephone Encounter (Signed)
This encounter was created in error - please disregard.

## 2022-01-27 NOTE — Telephone Encounter (Signed)
Pt called stating he missed his appt today and wanted to r/s today but there were no openings that I see. Offered him 02/03/22 and he states that he thinks he needs to be seen sooner, please advise.

## 2022-01-27 NOTE — Telephone Encounter (Signed)
Pt missed anticoagulation clinic appt. Called pt and rescheduled appt for next week. Also made pt aware since he stopped taking Amio, this can affect INR. Pt verbalized understanding and stated he will make sure to come to appt next Tuesday.  

## 2022-01-27 NOTE — Telephone Encounter (Signed)
Pt missed anticoagulation clinic appt. Called pt and rescheduled appt for next week. Also made pt aware since he stopped taking Amio, this can affect INR. Pt verbalized understanding and stated he will make sure to come to appt next Tuesday.

## 2022-02-03 ENCOUNTER — Ambulatory Visit: Payer: Medicaid Other | Attending: Cardiology

## 2022-02-03 DIAGNOSIS — Z5181 Encounter for therapeutic drug level monitoring: Secondary | ICD-10-CM

## 2022-02-03 DIAGNOSIS — I480 Paroxysmal atrial fibrillation: Secondary | ICD-10-CM | POA: Diagnosis not present

## 2022-02-03 DIAGNOSIS — Z7901 Long term (current) use of anticoagulants: Secondary | ICD-10-CM

## 2022-02-03 DIAGNOSIS — Q231 Congenital insufficiency of aortic valve: Secondary | ICD-10-CM | POA: Diagnosis not present

## 2022-02-03 LAB — POCT INR: INR: 3.2 — AB (ref 2.0–3.0)

## 2022-02-03 NOTE — Patient Instructions (Addendum)
Description   Hold today's dose and eat greens and then START warfarin 1 tablet daily except 0.5 tablet on Mondays, Wednesday, Fridays. Recheck INR in 1 week.  Call Coumadin Clinic with any medicaiton changes or up coming procedures.  [facility] # [phone]

## 2022-02-10 ENCOUNTER — Ambulatory Visit: Payer: Medicaid Other | Attending: Cardiology

## 2022-02-10 DIAGNOSIS — Z7901 Long term (current) use of anticoagulants: Secondary | ICD-10-CM

## 2022-02-10 DIAGNOSIS — Q231 Congenital insufficiency of aortic valve: Secondary | ICD-10-CM

## 2022-02-10 DIAGNOSIS — Z5181 Encounter for therapeutic drug level monitoring: Secondary | ICD-10-CM | POA: Diagnosis not present

## 2022-02-10 DIAGNOSIS — I480 Paroxysmal atrial fibrillation: Secondary | ICD-10-CM | POA: Diagnosis not present

## 2022-02-10 LAB — POCT INR: INR: 2 (ref 2.0–3.0)

## 2022-02-10 NOTE — Patient Instructions (Signed)
Description   Continue taking warfarin 1 tablet daily except 0.5 tablet on Mondays, Wednesday, Fridays. Recheck INR in 3 week.  Stay consistent with greens week  Call Coumadin Clinic with any medicaiton changes or up coming procedures.  [facility] # [phone]

## 2022-02-11 ENCOUNTER — Other Ambulatory Visit: Payer: Self-pay | Admitting: Internal Medicine

## 2022-02-11 DIAGNOSIS — E0580 Other thyrotoxicosis without thyrotoxic crisis or storm: Secondary | ICD-10-CM

## 2022-02-12 ENCOUNTER — Ambulatory Visit
Admission: RE | Admit: 2022-02-12 | Discharge: 2022-02-12 | Disposition: A | Discharge status: Home / Self Care | Payer: Medicaid Other | Source: Ambulatory Visit | Attending: Internal Medicine | Admitting: Internal Medicine

## 2022-02-12 DIAGNOSIS — E0580 Other thyrotoxicosis without thyrotoxic crisis or storm: Secondary | ICD-10-CM

## 2022-02-18 ENCOUNTER — Ambulatory Visit: Payer: Medicaid Other | Attending: Cardiology | Admitting: Cardiology

## 2022-02-18 ENCOUNTER — Encounter: Payer: Self-pay | Admitting: Cardiology

## 2022-02-18 VITALS — BP 132/70 | HR 70 | Ht 72.0 in | Wt 164.2 lb

## 2022-02-18 DIAGNOSIS — Z7901 Long term (current) use of anticoagulants: Secondary | ICD-10-CM | POA: Diagnosis not present

## 2022-02-18 DIAGNOSIS — I5042 Chronic combined systolic (congestive) and diastolic (congestive) heart failure: Secondary | ICD-10-CM

## 2022-02-18 DIAGNOSIS — I480 Paroxysmal atrial fibrillation: Secondary | ICD-10-CM

## 2022-02-18 DIAGNOSIS — E0590 Thyrotoxicosis, unspecified without thyrotoxic crisis or storm: Secondary | ICD-10-CM | POA: Diagnosis not present

## 2022-02-18 DIAGNOSIS — N183 Chronic kidney disease, stage 3 unspecified: Secondary | ICD-10-CM

## 2022-02-18 DIAGNOSIS — Z952 Presence of prosthetic heart valve: Secondary | ICD-10-CM

## 2022-02-18 DIAGNOSIS — I130 Hypertensive heart and chronic kidney disease with heart failure and stage 1 through stage 4 chronic kidney disease, or unspecified chronic kidney disease: Secondary | ICD-10-CM

## 2022-02-18 DIAGNOSIS — Z79899 Other long term (current) drug therapy: Secondary | ICD-10-CM

## 2022-02-18 NOTE — Progress Notes (Signed)
Cardiology Office Note:    Date:  02/18/2022   ID:  Joe Klein, DOB [birth year], MRN [id number]  PCP:  Joe Roger, MD  Cardiologist:  Joe More, MD    Referring MD: Joe Klein, Joe Cornea, MD  He developed ASSESSMENT:    1. On amiodarone therapy   2. Hyperthyroidism   3. Paroxysmal atrial fibrillation (HCC)   4. Long term (current) use of anticoagulants   5. S/P AVR (aortic valve replacement)   6. 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  I would never restart amiodarone I think he has had a surgical maze procedure our best to take a cautious approach monitor his rhythm using the mobile Kardia device paired to his phone if he develops recurrent atrial fibrillation we need to consider further EP catheter ablation or alternate antiarrhythmic drug therapy either dronedarone or Multaq. Continue methimazole I will check his TSH and free thyroid hormones today Continue his long-term anticoagulation Stable after AVR Stable blood pressure continue his current antihypertensive regimen intermittent furosemide hydralazine and labetalol we will recheck his renal function with a history of CKD   Next appointment: 6 months   Medication Adjustments/Labs and Tests Ordered: Current medicines are reviewed at length with the patient today.  Concerns regarding medicines are outlined above.  Orders Placed This Encounter  Procedures   TSH   T3, free   T4, free   EKG 12-Lead   No orders of the defined types were placed in this encounter.   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  For further evaluation I have thyroid ultrasound done which showed normal appearance there was no hypervascularity.  His initial TSH was 0.05 today I will recheck his thyroid test He lost about 15 pounds but he is recovered his strength his tremor has resolved and he feels close to normal. He is had no recurrent atrial fibrillation but despite his surgical maze he is at risk organ to hold off an additional antiarrhythmic drug therapy in regard to utilize the mobile cardia device to screen his heart rhythm at home. Not having edema shortness of breath chest pain palpitation or syncope. Past Medical History:  Diagnosis Date   AAA (abdominal aortic aneurysm) (HCC)    Aortic stenosis    CHF (congestive heart failure) (HCC)    Chronic kidney disease    stage 3   Dysrhythmia    A-fib   Hypertension     Past Surgical History:  Procedure Laterality Date   AORTIC VALVE REPLACEMENT N/A 12/16/2018  Procedure: AORTIC VALVE REPLACEMENT (AVR);  Surgeon: [name], MD;  Location: [place];  Service: Open Heart Surgery;  Laterality: N/A;   CARDIOVERSION N/A 11/21/2018   Procedure: CARDIOVERSION (CATH LAB);  Surgeon: Evans Lance, MD;  Location: [place] [facility];  Service: Cardiovascular;  Laterality: N/A;   CLIPPING OF ATRIAL APPENDAGE  Left 12/16/2018   Procedure: CLIPPING OF ATRIAL APPENDAGE;  Surgeon: [name], MD;  Location: [place];  Service: Open Heart Surgery;  Laterality: Left;  Appendage clipping with AtriCure AtriClip 50.   MANDIBLE FRACTURE SURGERY     MAZE N/A 12/16/2018   Procedure: MAZE;  Surgeon: [name], MD;  [facility];  Service: Open Heart Surgery;  Laterality: N/A;   RIGHT/LEFT HEART CATH AND CORONARY ANGIOGRAPHY N/A 11/18/2018   Procedure: RIGHT/LEFT HEART CATH AND CORONARY ANGIOGRAPHY;  Surgeon: Jettie Booze, MD;  Location: [place] [facility];  Service: Cardiovascular;  Laterality: N/A;   TEE WITHOUT CARDIOVERSION N/A 12/16/2018   Procedure: TRANSESOPHAGEAL ECHOCARDIOGRAM (TEE);  Surgeon: [name], MD;  Location: [place];  Service: Open Heart Surgery;  Laterality: N/A;   THORACIC AORTIC ANEURYSM REPAIR N/A 12/16/2018   Procedure: THORACIC ASCENDING ANEURYSM REPAIR;  Surgeon: [name], MD;  Location: [place];  Service: Open Heart Surgery;  Laterality: N/A;   TONSILLECTOMY      Current Medications: Current Meds  Medication Sig   aspirin 81 MG EC tablet Take 81 mg by mouth daily.    atorvastatin (LIPITOR) 40 MG tablet Take 1 tablet (40 mg total) by mouth daily.   furosemide (LASIX) 20 MG tablet Take 1 tablet by mouth once or twice weekly. (Patient taking differently: Take 1 tablet by mouth once or twice weekly as needed for edema)   hydrALAZINE (APRESOLINE) 25 MG tablet Take 3 tablets (75 mg total) by mouth in the morning and at bedtime.   labetalol (NORMODYNE) 100 MG tablet Take 1 tablet (100 mg total) by mouth 2 (two) times daily.   methimazole (TAPAZOLE) 10 MG tablet Take 10 mg by mouth daily.   warfarin (COUMADIN) 2.5 MG tablet TAKE 1 TABLET BY MOUTH ONCE DAILY OR AS  DIRECTED BY COUMADIN  CLINIC     Allergies:   Patient has no known allergies.   Social History   Socioeconomic History   Marital status: Single    Spouse name: Not on file   Number of  children: Not on file   Years of education: Not on file   Highest education level: Not on file  Occupational History   Not on file  Tobacco Use   Smoking status: Never    Passive exposure: Never   Smokeless tobacco: Never  Vaping Use   Vaping Use: Never used  Substance and Sexual Activity   Alcohol use: Never   Drug use: Never   Sexual activity: Not on file  Other Topics Concern   Not on file  Social History Narrative   Not on file   Social Determinants of Health   Financial Resource Strain: Not on file  Food Insecurity: Not on file  Transportation Needs: Not on file  Physical Activity: Not on file  Stress: Not on file  Social Connections: Not on file     Family History: The patient's family history includes Cancer in his father; Heart disease in his mother; Hypertension in his brother and mother. ROS:   Please see the history of present illness.    All other systems reviewed  and are negative.  EKGs/Labs/Other Studies Reviewed:    The following studies were reviewed today:  EKG:  EKG ordered today and personally reviewed.  The ekg ordered today demonstrates sinus rhythm left bundle branch block  Recent Labs: 06/19/2021: ALT 16; BUN 30; Creatinine, Ser 2.08; Potassium 4.9; Sodium 139; TSH 1.540  Recent Lipid Panel    Component Value Date/Time   CHOL 121 06/19/2021 1427   TRIG 82 06/19/2021 1427   HDL 35 (L) 06/19/2021 1427   CHOLHDL 3.5 06/19/2021 1427   LDLCALC 70 06/19/2021 1427    Physical Exam:    VS:  BP 132/70 (BP Location: Right Arm, Patient Position: Sitting)   Pulse 70   Ht 6' (1.829 m)   Wt 164 lb 3.2 oz (74.5 kg)   SpO2 98%   BMI 22.27 kg/m     Wt Readings from Last 3 Encounters:  02/18/22 164 lb 3.2 oz (74.5 kg)  06/19/21 186 lb 9.6 oz (84.6 kg)  11/05/20 189 lb (85.7 kg)    He states he is GEN:  Well nourished, well developed in no acute distress has a mild tremor HEENT: Normal NECK: No JVD; No carotid bruits LYMPHATICS: No  lymphadenopathy CARDIAC: RRR, no murmurs, rubs, gallops RESPIRATORY:  Clear to auscultation without rales, wheezing or rhonchi  ABDOMEN: Soft, non-tender, non-distended MUSCULOSKELETAL:  No edema; No deformity  SKIN: Warm and dry NEUROLOGIC:  Alert and oriented x 3 PSYCHIATRIC:  Normal affect    Signed, Joe More, MD  02/18/2022 11:54 AM    [place]

## 2022-02-18 NOTE — Patient Instructions (Addendum)
KardiaMobile [email or web address]        FDA-cleared, clinical grade mobile EKG monitor: Joe Klein is the most clinically-validated mobile EKG used by the world's leading cardiac care medical professionals With Basic service, know instantly if your heart rhythm is normal or if atrial fibrillation is detected, and email the last single EKG recording to yourself or your doctor Premium service, available for purchase through the Kardia app for $9.99 per month or $99 per year, includes unlimited history and storage of your EKG recordings, a monthly EKG summary report to share with your doctor, along with the ability to track your blood pressure, activity and weight Includes one KardiaMobile phone clip FREE SHIPPING: Standard delivery 1-3 business days. Orders placed by 11:00am PST will ship that afternoon. Otherwise, will ship next business day. All orders ship via [employer] from [place], [place]    Medication Instructions:  Your physician recommends that you continue on your current medications as directed. Please refer to the Current Medication list given to you today.  *If you need a refill on your cardiac medications before your next appointment, please call your pharmacy*   Lab Work: Your physician recommends that you return for lab work in: Today for a TSH, T4 and T3, BMP  If you have labs (blood work) drawn today and your tests are completely normal, you will receive your results only by: [place] (if you have [place]) OR A paper copy in the mail If you have any lab test that is abnormal or we need to change your treatment, we will call you to review the results.   Testing/Procedures: NONE   Follow-Up: At [facility], you and your health needs are our priority.  As part of our continuing mission to provide you with exceptional heart care, we have created designated Provider Care Teams.  These Care Teams include your primary Cardiologist  (physician) and Advanced Practice Providers (APPs -  Physician Assistants and Nurse Practitioners) who all work together to provide you with the care you need, when you need it.  We recommend signing up for the patient portal called "MyChart".  Sign up information is provided on this After Visit Summary.  MyChart is used to connect with patients for Virtual Visits (Telemedicine).  Patients are able to view lab/test results, encounter notes, upcoming appointments, etc.  Non-urgent messages can be sent to your provider as well.   To learn more about what you can do with MyChart, go to [email or web address].    Your next appointment:   6 month(s)  The format for your next appointment:   In Person  Provider:   Shirlee More, MD    Other Instructions Get the Mobile Kardia Send report to Dr Bettina Gavia if frequent Atrial Fibrillation  Important Information About Sugar

## 2022-02-19 ENCOUNTER — Encounter: Payer: Self-pay | Admitting: Cardiology

## 2022-02-19 LAB — BASIC METABOLIC PANEL
BUN/Creatinine Ratio: 17 (ref 10–24)
BUN: 26 mg/dL (ref 8–27)
CO2: 22 mmol/L (ref 20–29)
Calcium: 9.6 mg/dL (ref 8.6–10.2)
Chloride: 104 mmol/L (ref 96–106)
Creatinine, Ser: 1.52 mg/dL — ABNORMAL HIGH (ref 0.76–1.27)
Glucose: 136 mg/dL — ABNORMAL HIGH (ref 70–99)
Potassium: 4.7 mmol/L (ref 3.5–5.2)
Sodium: 140 mmol/L (ref 134–144)
eGFR: 51 mL/min/{1.73_m2} — ABNORMAL LOW (ref >59)

## 2022-02-19 LAB — TSH: TSH: <0.005 u[IU]/mL — ABNORMAL LOW (ref 0.450–4.500)

## 2022-02-19 LAB — T4, FREE: Free T4: 4.52 ng/dL — ABNORMAL HIGH (ref 0.82–1.77)

## 2022-02-19 LAB — T3, FREE: T3, Free: 7.3 pg/mL — ABNORMAL HIGH (ref 2.0–4.4)

## 2022-03-03 ENCOUNTER — Telehealth: Payer: Self-pay

## 2022-03-03 ENCOUNTER — Ambulatory Visit: Payer: Medicaid Other

## 2022-03-03 ENCOUNTER — Telehealth: Payer: Self-pay | Admitting: Cardiology

## 2022-03-03 DIAGNOSIS — I480 Paroxysmal atrial fibrillation: Secondary | ICD-10-CM

## 2022-03-03 NOTE — Telephone Encounter (Signed)
Pt missed anticoagulation appt. Called and spoke with pt concerning the need to check INR since he has not been therapeutic consistently and his Amio was discontinued. Pt stated he could come to [facility] in the morning to have PT/INR drawn. Lab order placed.

## 2022-03-03 NOTE — Telephone Encounter (Signed)
Patient came by office stating that someone needs to call his insurance company and tell them he needs the Chad mobile. Insurance company is [employer] [phone]. Please advise.

## 2022-03-04 ENCOUNTER — Ambulatory Visit (INDEPENDENT_AMBULATORY_CARE_PROVIDER_SITE_OTHER): Payer: Medicaid Other

## 2022-03-04 ENCOUNTER — Encounter: Payer: Self-pay | Admitting: Cardiology

## 2022-03-04 DIAGNOSIS — Q231 Congenital insufficiency of aortic valve: Secondary | ICD-10-CM | POA: Diagnosis not present

## 2022-03-04 DIAGNOSIS — Z7901 Long term (current) use of anticoagulants: Secondary | ICD-10-CM | POA: Diagnosis not present

## 2022-03-04 DIAGNOSIS — Z5181 Encounter for therapeutic drug level monitoring: Secondary | ICD-10-CM

## 2022-03-04 DIAGNOSIS — I480 Paroxysmal atrial fibrillation: Secondary | ICD-10-CM

## 2022-03-04 LAB — POCT INR: INR: 1.5 — AB (ref 2.0–3.0)

## 2022-03-04 LAB — PROTIME-INR
INR: 1.5 — ABNORMAL HIGH (ref 0.9–1.2)
Prothrombin Time: 14.6 s — ABNORMAL HIGH (ref 9.1–12.0)

## 2022-03-04 NOTE — Patient Instructions (Addendum)
Description   Called and spoke with pt. Instructed to take 1 tablet today and then START taking warfarin 1 tablet daily except 0.5 tablet on Sundays, Mondays, Wednesday, Fridays.  Recheck INR in 4 weeks (per pt request).  Stay consistent with greens week  Call [facility] with any medicaiton changes or up coming procedures.  [facility] # [phone]

## 2022-03-04 NOTE — Telephone Encounter (Signed)
Pt aware that a note has been completed and is ready for pickup.

## 2022-03-09 ENCOUNTER — Other Ambulatory Visit: Payer: Self-pay | Admitting: Cardiology

## 2022-03-09 DIAGNOSIS — I10 Essential (primary) hypertension: Secondary | ICD-10-CM

## 2022-03-09 DIAGNOSIS — Z952 Presence of prosthetic heart valve: Secondary | ICD-10-CM

## 2022-03-10 NOTE — Telephone Encounter (Signed)
Rx refill sent to pharmacy. 

## 2022-03-11 ENCOUNTER — Other Ambulatory Visit: Payer: Self-pay | Admitting: Internal Medicine

## 2022-03-11 ENCOUNTER — Encounter: Payer: Self-pay | Admitting: Internal Medicine

## 2022-03-11 ENCOUNTER — Encounter: Payer: Self-pay | Admitting: Cardiology

## 2022-03-11 DIAGNOSIS — Z9889 Other specified postprocedural states: Secondary | ICD-10-CM

## 2022-03-11 DIAGNOSIS — Z952 Presence of prosthetic heart valve: Secondary | ICD-10-CM

## 2022-03-11 NOTE — Telephone Encounter (Signed)
Message from Dr. Tomie China to schedule pt with Dr. Dulce Sellar when her returns from vacation. Message sent to front desk for appt.

## 2022-03-12 ENCOUNTER — Other Ambulatory Visit: Payer: Self-pay

## 2022-03-12 ENCOUNTER — Encounter: Payer: Self-pay | Admitting: Nurse Practitioner

## 2022-03-12 DIAGNOSIS — I480 Paroxysmal atrial fibrillation: Secondary | ICD-10-CM

## 2022-03-17 ENCOUNTER — Ambulatory Visit: Payer: Medicaid Other

## 2022-03-17 NOTE — Progress Notes (Signed)
Blood pressure check.

## 2022-03-30 ENCOUNTER — Encounter: Payer: Self-pay | Admitting: Cardiology

## 2022-03-31 ENCOUNTER — Ambulatory Visit: Payer: Medicaid Other | Attending: Cardiology

## 2022-03-31 ENCOUNTER — Ambulatory Visit: Payer: Medicaid Other

## 2022-03-31 DIAGNOSIS — Z7901 Long term (current) use of anticoagulants: Secondary | ICD-10-CM

## 2022-03-31 DIAGNOSIS — Z5181 Encounter for therapeutic drug level monitoring: Secondary | ICD-10-CM | POA: Diagnosis not present

## 2022-03-31 DIAGNOSIS — I480 Paroxysmal atrial fibrillation: Secondary | ICD-10-CM | POA: Diagnosis not present

## 2022-03-31 DIAGNOSIS — Q231 Congenital insufficiency of aortic valve: Secondary | ICD-10-CM

## 2022-03-31 LAB — POCT INR: INR: 1.4 — AB (ref 2.0–3.0)

## 2022-03-31 NOTE — Patient Instructions (Signed)
Description   Take 1.5 tablets today and then continue taking warfarin 1 tablet daily except 0.5 tablet on Sundays, Mondays, Wednesday, Fridays.  Recheck INR in 5 weeks.  Stay consistent with greens week  Call Cou[facility]in Clinic with any medicaiton changes or up coming procedures.  [facility] # [phone]

## 2022-04-01 ENCOUNTER — Encounter: Payer: Self-pay | Admitting: Cardiology

## 2022-04-06 ENCOUNTER — Encounter: Payer: Self-pay | Admitting: Cardiology

## 2022-04-06 ENCOUNTER — Ambulatory Visit: Payer: Medicaid Other | Attending: Cardiology | Admitting: Cardiology

## 2022-04-06 ENCOUNTER — Ambulatory Visit: Payer: Medicaid Other | Attending: Cardiology

## 2022-04-06 VITALS — BP 144/74 | HR 61 | Ht 72.0 in | Wt 169.8 lb

## 2022-04-06 DIAGNOSIS — D6869 Other thrombophilia: Secondary | ICD-10-CM | POA: Diagnosis not present

## 2022-04-06 DIAGNOSIS — I480 Paroxysmal atrial fibrillation: Secondary | ICD-10-CM

## 2022-04-06 NOTE — Progress Notes (Signed)
Electrophysiology Office Note   Date:  04/06/2022   ID:  Joe Klein, DOB 05-02-1961, MRN [id number]  PCP:  Crist Fat, MD  Cardiologist:  Dulce Sellar Primary Electrophysiologist:  Kielee Care Jorja Loa, MD    Chief Complaint: AF   History of Present Illness: Joe Klein is a 61 y.o. male who is being seen today for the evaluation of AF at the request of Dulce Sellar, Iline Oven, MD. Presenting today for electrophysiology evaluation.  He has a history significant for bicuspid aortic valve with ascending aortic aneurysm, hypertension, CKD stage III.  He is post aortic valve replacement with ascending aorta and distal hemiarch reconstruction, surgical maze with left atrial appendage clipping.  He is currently on amiodarone.  He has developed hyperthyroidism and is now on methimazole.  Today, he denies symptoms of palpitations, chest pain, shortness of breath, orthopnea, PND, lower extremity edema, claudication, dizziness, presyncope, syncope, bleeding, or neurologic sequela. The patient is tolerating medications without difficulties.  Since stopping his amiodarone, he has had intermittent palpitations.  He has not had overt fatigue or shortness of breath.  He had a cardia mobile app that showed irregular heart rhythms.  He had palpitations during those times.  Past Medical History:  Diagnosis Date   AAA (abdominal aortic aneurysm) (HCC)    Aortic stenosis    CHF (congestive heart failure) (HCC)    Chronic kidney disease    stage 3   Dysrhythmia    A-fib   Hypertension    Past Surgical History:  Procedure Laterality Date   AORTIC VALVE REPLACEMENT N/A 12/16/2018   Procedure: AORTIC VALVE REPLACEMENT (AVR);  Surgeon: Linden Dolin, MD;  Location: [facility];  Service: Open Heart Surgery;  Laterality: N/A;   CARDIOVERSION N/A 11/21/2018   Procedure: CARDIOVERSION (CATH LAB);  Surgeon: Marinus Maw, MD;  Location: [facility] INVASIVE CV LAB;  Service: Cardiovascular;  Laterality: N/A;    CLIPPING OF ATRIAL APPENDAGE Left 12/16/2018   Procedure: CLIPPING OF ATRIAL APPENDAGE;  Surgeon: Linden Dolin, MD;  Location: [facility] OR;  Service: Open Heart Surgery;  Laterality: Left;  Appendage clipping with AtriCure AtriClip 50.   MANDIBLE FRACTURE SURGERY     MAZE N/A 12/16/2018   Procedure: MAZE;  Surgeon: Linden Dolin, MD;  Location: [facility] OR;  Service: Open Heart Surgery;  Laterality: N/A;   RIGHT/LEFT HEART CATH AND CORONARY ANGIOGRAPHY N/A 11/18/2018   Procedure: RIGHT/LEFT HEART CATH AND CORONARY ANGIOGRAPHY;  Surgeon: Corky Crafts, MD;  Location: [facility] INVASIVE CV LAB;  Service: Cardiovascular;  Laterality: N/A;   TEE WITHOUT CARDIOVERSION N/A 12/16/2018   Procedure: TRANSESOPHAGEAL ECHOCARDIOGRAM (TEE);  Surgeon: Linden Dolin, MD;  Location: [facility] OR;  Service: Open Heart Surgery;  Laterality: N/A;   THORACIC AORTIC ANEURYSM REPAIR N/A 12/16/2018   Procedure: THORACIC ASCENDING ANEURYSM REPAIR;  Surgeon: Linden Dolin, MD;  Location: [facility] OR;  Service: Open Heart Surgery;  Laterality: N/A;   TONSILLECTOMY       Current Outpatient Medications  Medication Sig Dispense Refill   aspirin 81 MG EC tablet Take 81 mg by mouth daily.      atorvastatin (LIPITOR) 40 MG tablet Take 1 tablet (40 mg total) by mouth daily. 90 tablet 3   furosemide (LASIX) 20 MG tablet Take 20 mg by mouth daily as needed (as directed).     hydrALAZINE (APRESOLINE) 25 MG tablet TAKE 3 TABLETS BY MOUTH IN THE MORNING AND AT BEDTIME 504 tablet 1   JARDIANCE 10 MG TABS tablet  Take 10 mg by mouth every morning.     labetalol (NORMODYNE) 100 MG tablet Take 1 tablet by mouth twice daily 180 tablet 1   methimazole (TAPAZOLE) 10 MG tablet Take 10 mg by mouth daily.     warfarin (COUMADIN) 2.5 MG tablet TAKE 1 TABLET BY MOUTH ONCE DAILY OR AS  DIRECTED BY COUMADIN  CLINIC 100 tablet 0   No current facility-administered medications for this visit.    Allergies:   Patient has no known allergies.   Social  History:  The patient  reports that he has never smoked. He has never been exposed to tobacco smoke. He has never used smokeless tobacco. He reports that he does not drink alcohol and does not use drugs.   Family History:  The patient's family history includes Cancer in his father; Heart disease in his mother; Hypertension in his brother and mother.    ROS:  Please see the history of present illness.   Otherwise, review of systems is positive for none.   All other systems are reviewed and negative.    PHYSICAL EXAM: VS:  BP (!) 144/74   Pulse 61   Ht 6' (1.829 m)   Wt 169 lb 12.8 oz (77 kg)   SpO2 95%   BMI 23.03 kg/m  , BMI Body mass index is 23.03 kg/m. GEN: Well nourished, well developed, in no acute distress  HEENT: normal  Neck: no JVD, carotid bruits, or masses Cardiac: irregular; no murmurs, rubs, or gallops,no edema  Respiratory:  clear to auscultation bilaterally, normal work of breathing GI: soft, nontender, nondistended, + BS MS: no deformity or atrophy  Skin: warm and dry Neuro:  Strength and sensation are intact Psych: euthymic mood, full affect  EKG:  EKG is ordered today. Personal review of the ekg ordered shows sinus rhythm, PACs  Recent Labs: 06/19/2021: ALT 16 02/18/2022: BUN 26; Creatinine, Ser 1.52; Potassium 4.7; Sodium 140; TSH <0.005    Lipid Panel     Component Value Date/Time   CHOL 121 06/19/2021 1427   TRIG 82 06/19/2021 1427   HDL 35 (L) 06/19/2021 1427   CHOLHDL 3.5 06/19/2021 1427   LDLCALC 70 06/19/2021 1427     Wt Readings from Last 3 Encounters:  04/06/22 169 lb 12.8 oz (77 kg)  02/18/22 164 lb 3.2 oz (74.5 kg)  06/19/21 186 lb 9.6 oz (84.6 kg)      Other studies Reviewed: Additional studies/ records that were reviewed today include: TTE 08/31/19  Review of the above records today demonstrates:   1. Left ventricular ejection fraction, by estimation, is 65 to 70%. The  left ventricle has normal function. The left ventricle has no  regional  wall motion abnormalities. There is moderate concentric left ventricular  hypertrophy. Left ventricular  diastolic parameters are consistent with Grade III diastolic dysfunction  (restrictive).   2. Right ventricular systolic function is normal. The right ventricular  size is normal. There is mildly elevated pulmonary artery systolic  pressure.   3. The mitral valve is normal in structure. Trivial mitral valve  regurgitation. No evidence of mitral stenosis.   4. Replacement of ascending aorta with super coronary graft implantation  (28 mm Hemashield platinum dacryon) proximally and a distal hemi-arch  reconstruction under hypothermic circulatory arrest (17 minutes with 15  minutes of antegrade cerebral  perfusion), aortic valve replacement (23 mm On-X mechanical valve),  bilateral pulmonary vein isolation and left atrial appendage clipping. The  aortic valve is normal in structure.  Aortic valve regurgitation 2 trivial  jets. No aortic stenosis is present.  There is a 23 mm On X On X valve present in the aortic position. Procedure  Date: 12/17/2018. Echo findings are consistent with normal structure and  function of the aortic valve prosthesis.   5. The inferior vena cava is normal in size with greater than 50%  respiratory variability, suggesting right atrial pressure of 3 mmHg.    ASSESSMENT AND PLAN:  1.  Paroxysmal atrial fibrillation: Currently on labetalol 100 mg twice daily, warfarin.  CHA2DS2-VASc of 2.  He was previously on amiodarone but had hyperthyroidism.  Amiodarone has since been stopped.  He has sinus rhythm with PACs currently.  He is feeling well with only mild palpitations.  I reviewed his cardia mobile strips and it is unclear to me whether or not he is having episodes of atrial fibrillation.  The PACs on his ECG further complicate this.  He does have some baseline artifact on his cardia mobile app recordings.  We Joe Klein have him wear a 2-week monitor to further  determine if he is having episodes of atrial fibrillation.  I discussed this plan with his primary cardiologist.  2.  Hyperthyroidism: Currently on methimazole.  Avoiding amiodarone.  3.  Status post AVR: Stable on most recent echo.  Plan per primary cardiology.  4.  Secondary to coagula state: Currently on Eliquis for atrial fibrillation as above    Current medicines are reviewed at length with the patient today.   The patient does not have concerns regarding his medicines.  The following changes were made today:  none  Labs/ tests ordered today include:  Orders Placed This Encounter  Procedures   LONG TERM MONITOR (3-14 DAYS)   EKG 12-Lead     Disposition:   FU with Joe Klein 3 months  Signed, Joe Klein Jorja Loa, MD  04/06/2022 1:18 PM     [facility] [address] [place] [address] [phone] (office) [phone] (fax)

## 2022-04-06 NOTE — Patient Instructions (Signed)
Medication Instructions:  Your physician recommends that you continue on your current medications as directed. Please refer to the Current Medication list given to you today.  *If you need a refill on your cardiac medications before your next appointment, please call your pharmacy*   Lab Work: None ordered   Testing/Procedures:                           ZIO XT- Long Term Monitor Instructions  Your physician has requested you wear a ZIO patch monitor for 14 days.  This is a single patch monitor. [employer] supplies one patch monitor per enrollment. Additional stickers are not available. Please do not apply patch if you will be having a Nuclear Stress Test,  Echocardiogram, Cardiac CT, MRI, or Chest Xray during the period you would be wearing the  monitor. The patch cannot be worn during these tests. You cannot remove and re-apply the  ZIO XT patch monitor.  Your ZIO patch monitor will be mailed 3 day USPS to your address on file. It may take 3-5 days  to receive your monitor after you have been enrolled.  Once you have received your monitor, please review the enclosed instructions. Your monitor  has already been registered assigning a specific monitor serial # to you.  Billing and Patient Assistance Program Information  We have supplied [employer] with any of your insurance information on file for billing purposes. [employer] offers a sliding scale Patient Assistance Program for patients that do not have  insurance, or whose insurance does not completely cover the cost of the ZIO monitor.  You must apply for the Patient Assistance Program to qualify for this discounted rate.  To apply, please call [employer] at [phone], select option 4, select option 2, ask to apply for  Patient Assistance Program. [employer] will ask your household income, and how many people  are in your household. They will quote your out-of-pocket cost based on that information.  [employer] will also be able to set up a  12-month, interest-free payment plan if needed.  Applying the monitor   Shave hair from upper left chest.  Hold abrader disc by orange tab. Rub abrader in 40 strokes over the upper left chest as  indicated in your monitor instructions.  Clean area with 4 enclosed alcohol pads. Let dry.  Apply patch as indicated in monitor instructions. Patch will be placed under collarbone on left  side of chest with arrow pointing upward.  Rub patch adhesive wings for 2 minutes. Remove white label marked "1". Remove the white  label marked "2". Rub patch adhesive wings for 2 additional minutes.  While looking in a mirror, press and release button in center of patch. A small green light will  flash 3-4 times. This will be your only indicator that the monitor has been turned on.  Do not shower for the first 24 hours. You may shower after the first 24 hours.  Press the button if you feel a symptom. You will hear a small click. Record Date, Time and  Symptom in the Patient Logbook.  When you are ready to remove the patch, follow instructions on the last 2 pages of Patient  Logbook. Stick patch monitor onto the last page of Patient Logbook.  Place Patient Logbook in the blue and white box. Use locking tab on box and tape box closed  securely. The blue and white box has prepaid postage on it. Please place it in the   mailbox as  soon as possible. Your physician should have your test results approximately 7 days after the  monitor has been mailed back to [facility].  Call [facility] Customer Care at [phone] if you have questions regarding  your ZIO XT patch monitor. Call them immediately if you see an orange light blinking on your  monitor.  If your monitor falls off in less than 4 days, contact our Monitor department at [phone].  If your monitor becomes loose or falls off after 4 days call [employer] at [phone] for  suggestions on securing your monitor    Follow-Up: At [facility],  you and your health needs are our priority.  As part of our continuing mission to provide you with exceptional heart care, we have created designated Provider Care Teams.  These Care Teams include your primary Cardiologist (physician) and Advanced Practice Providers (APPs -  Physician Assistants and Nurse Practitioners) who all work together to provide you with the care you need, when you need it.  Your next appointment:   To be  determined  The format for your next appointment:   In Person  Provider:   Loman Brooklyn, MD    Thank you for choosing [facility]!!   Dory Horn, RN [phone]  Other Instructions   Important Information About Sugar

## 2022-04-06 NOTE — Progress Notes (Unsigned)
Enrolled for [employer] to mail a ZIO XT long term holter monitor to the patients address on file.  

## 2022-04-14 ENCOUNTER — Inpatient Hospital Stay: Admission: RE | Admit: 2022-04-14 | Payer: Medicaid Other | Source: Ambulatory Visit

## 2022-04-16 ENCOUNTER — Ambulatory Visit: Payer: Medicaid Other | Admitting: Nurse Practitioner

## 2022-04-16 ENCOUNTER — Encounter: Payer: Self-pay | Admitting: Nurse Practitioner

## 2022-04-16 ENCOUNTER — Telehealth: Payer: Self-pay

## 2022-04-16 VITALS — BP 152/86 | HR 89 | Ht 72.0 in | Wt 173.5 lb

## 2022-04-16 DIAGNOSIS — Z1211 Encounter for screening for malignant neoplasm of colon: Secondary | ICD-10-CM

## 2022-04-16 NOTE — Progress Notes (Signed)
04/16/2022 Joe Klein [id number] 1960-05-19   CHIEF COMPLAINT: Schedule a colonoscopy   HISTORY OF PRESENT ILLNESS: Joe Klein is a 61 year old male with a past medical history of CKD stage III, hypertension, paroxysmal atrial fibrillation on Warfarin, AS and thoracic aortic aneurysm s/p aortic valve replacement with ascending aorta and distal hemiarch reconstruction, surgical maze with left atrial appendage clipping 12/2018 and CHF. 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 His most recent EKG 04/06/2022 showed a NSR with PACs per cardiology follow up notes. He feels occasional hearti irregularity without chest pain. A 14 day Zio patch monitor was ordered  by his cardiologist, he intends to start this study tomorrow. He presents to our office today as referred by Dr. Michel Santee Klein to schedule a screening colonoscopy. He denies ever having a screening colonoscopy. He is passing a normal formed brown stool daily. No rectal bleeding or black stools. No known family history of colon cancer. He remains quite active and rides a motorcycle.       Latest Ref Rng & Units 02/18/2022   12:01 PM 06/19/2021    2:27 PM 11/05/2020    3:22 PM  CMP  Glucose 70 - 99 mg/dL 277  412  878   BUN 8 - 27 mg/dL 26  30  35   Creatinine 0.76 - 1.27 mg/dL 6.76  7.20  9.47   Sodium 134 - 144 mmol/L 140  139  140   Potassium 3.5 - 5.2 mmol/L 4.7  4.9  4.3   Chloride 96 - 106 mmol/L 104  102  104   CO2 20 - 29 mmol/L 22  21  21    Calcium 8.6 - 10.2 mg/dL 9.6  9.8  9.6   Total Protein 6.0 - 8.5 g/dL  7.9  8.1   Total Bilirubin 0.0 - 1.2 mg/dL  0.4  0.4   Alkaline Phos 44 - 121 IU/L  46  46   AST 0 - 40 IU/L  18  22   ALT 0 - 44 IU/L  16  23     No recent CBC in Epic.      Latest Ref Rng & Units 07/27/2019   11:43 AM 01/06/2019    4:50 PM 12/24/2018    2:36 AM  CBC  WBC 3.4 - 10.8 x10E3/uL  7.7  12.0  13.3   Hemoglobin 13.0 - 17.7 g/dL 12/26/2018  09.6  9.5   Hematocrit 37.5 - 51.0 % 38.4  36.5  29.5   Platelets 150 - 450 x10E3/uL 218  605  358      ECHO 08/31/2019: 1. Left ventricular ejection fraction, by estimation, is 65 to 70%. The left ventricle has normal function. The left ventricle has no regional wall motion abnormalities. There is moderate concentric left ventricular hypertrophy. Left ventricular diastolic parameters are consistent with Grade III diastolic dysfunction (restrictive). 2. Right ventricular systolic function is normal. The right ventricular size is normal. There is mildly elevated pulmonary artery systolic pressure. 3. The mitral valve is normal in structure. Trivial mitral valve regurgitation. No evidence of mitral stenosis. 4. Replacement of ascending aorta with super coronary graft implantation (28 mm Hemashield platinum dacryon) proximally and a distal hemi-arch reconstruction under hypothermic circulatory arrest (17 minutes with 15 minutes of antegrade cerebral perfusion), aortic valve replacement (23 mm On-X mechanical valve), bilateral pulmonary vein isolation and left atrial appendage clipping. The aortic valve is  normal in structure. Aortic valve regurgitation 2 trivial jets. No aortic stenosis is present. There is a 23 mm On X On X valve present in the aortic position. Procedure Date: 12/17/2018. Echo findings are consistent with normal structure and function of the aortic valve prosthesis. 5. The inferior vena cava is normal in size with greater than 50% respiratory variability, suggesting right atrial pressure of 3 mmHg.   Past Medical History:  Diagnosis Date   AAA (abdominal aortic aneurysm) (HCC)    Aortic stenosis    CHF (congestive heart failure) (HCC)    Chronic kidney disease    stage 3   Dysrhythmia    A-fib   Hypertension    Past Surgical History:  Procedure Laterality Date   AORTIC VALVE REPLACEMENT N/A 12/16/2018    Procedure: AORTIC VALVE REPLACEMENT (AVR);  Surgeon: Linden Dolin, MD;  Location[facility] OR;  Service: Open Heart Surgery;  Laterality: N/A;   CARDIOVERSION N/A 11/21/2018   Procedure: CARDIOVERSION (CATH LAB);  Surgeon: Marinus Maw, MD;  Location: [facility] INVASIVE CV LAB;  Service: Cardiovascular;  Laterality: N/A;   CLIPPING OF ATRIAL APPENDAGE Left 12/16/2018   Procedure: CLIPPING OF ATRIAL APPENDAGE;  Surgeon: Linden Dolin, MD;  Location: [facility] OR;  Service: Open Heart Surgery;  Laterality: Left;  Appendage clipping with AtriCure AtriClip 50.   MANDIBLE FRACTURE SURGERY     MAZE N/A 12/16/2018   Procedure: MAZE;  Surgeon: Linden Dolin, MD;  Location: [facility] OR;  Service: Open Heart Surgery;  Laterality: N/A;   RIGHT/LEFT HEART CATH AND CORONARY ANGIOGRAPHY N/A 11/18/2018   Procedure: RIGHT/LEFT HEART CATH AND CORONARY ANGIOGRAPHY;  Surgeon: Corky Crafts, MD;  Location: [facility] INVASIVE CV LAB;  Service: Cardiovascular;  Laterality: N/A;   TEE WITHOUT CARDIOVERSION N/A 12/16/2018   Procedure: TRANSESOPHAGEAL ECHOCARDIOGRAM (TEE);  Surgeon: Linden Dolin, MD;  Location: [facility] OR;  Service: Open Heart Surgery;  Laterality: N/A;   THORACIC AORTIC ANEURYSM REPAIR N/A 12/16/2018   Procedure: THORACIC ASCENDING ANEURYSM REPAIR;  Surgeon: Linden Dolin, MD;  Location: [facility] OR;  Service: Open Heart Surgery;  Laterality: N/A;   TONSILLECTOMY     Social History: He is divorced. He has one son. He is self employed. Past smoker. No alcohol use. No drug use.   Family History: Father had bone cancer. Mother had heart disease/CHF. Brother with hypertension. No known family history of colorectal cancer.  No Known Allergies   Outpatient Encounter Medications as of 04/16/2022  Medication Sig   aspirin 81 MG EC tablet Take 81 mg by mouth daily.    atorvastatin (LIPITOR) 40 MG tablet Take 1 tablet (40 mg total) by mouth daily.   furosemide (LASIX) 20 MG tablet Take 20 mg by mouth daily as needed (as  directed).   hydrALAZINE (APRESOLINE) 25 MG tablet TAKE 3 TABLETS BY MOUTH IN THE MORNING AND AT BEDTIME   JARDIANCE 10 MG TABS tablet Take 10 mg by mouth every morning.   labetalol (NORMODYNE) 100 MG tablet Take 1 tablet by mouth twice daily   methimazole (TAPAZOLE) 10 MG tablet Take 10 mg by mouth daily.   warfarin (COUMADIN) 2.5 MG tablet TAKE 1 TABLET BY MOUTH ONCE DAILY OR AS  DIRECTED BY COUMADIN  CLINIC   No facility-administered encounter medications on file as of 04/16/2022.   REVIEW OF SYSTEMS:  Gen: Denies fever, sweats or chills. No weight loss.  CV: See HPI.  Resp: Denies cough, shortness of breath of hemoptysis.  GI: Denies  heartburn, dysphagia, stomach or lower abdominal pain. No diarrhea or constipation.  GU : Denies urinary burning, blood in urine, increased urinary frequency or incontinence. MS: Denies joint pain, muscles aches or weakness. Derm: Denies rash, itchiness, skin lesions or unhealing ulcers. Psych: Denies depression, anxiety or memory loss. Heme: Denies bruising, easy bleeding. Neuro:  Denies headaches, dizziness or paresthesias. Endo:  Denies any problems with DM, thyroid or adrenal function.  PHYSICAL EXAM: BP (!) 152/86   Pulse 89   Ht 6' (1.829 m)   Wt 173 lb 8 oz (78.7 kg)   BMI 23.53 kg/m  General: 61 year old male in NAD.  Head: Normocephalic and atraumatic. Eyes:  Sclerae non-icteric, conjunctive pink. Ears: Normal auditory acuity. Mouth: Dentition intact. No ulcers or lesions.  Neck: Supple, no lymphadenopathy or thyromegaly.  Lungs: Clear bilaterally to auscultation without wheezes, crackles or rhonchi. Heart: Slightly irregular rhythm. No murmur, rub or gallop appreciated.  Abdomen: Soft, nontender, non distended. No masses. No hepatosplenomegaly. Normoactive bowel sounds x 4 quadrants.  Rectal: Deferred. Musculoskeletal: Symmetrical with no gross deformities. Skin: Warm and dry. No rash or lesions on visible extremities. Extremities:  No edema. Neurological: Alert oriented x 4, no focal deficits.  Psychological:  Alert and cooperative. Normal mood and affect.  ASSESSMENT AND PLAN:  34) 61 year old male presents to schedule a screening colonoscopy -Colonoscopy benefits and risks discussed including risk with sedation, risk of bleeding, perforation and infection  -Colonoscopy to be scheduled after Zio patch study completed and cardiac clearance with Warfarin hold instructions received from his cardiologist   2) Paroxysmal atrial fibrillation on Labetalol and Warfarin. Amiodarone discontinued secondary to associated hyperthyroidism requiring Methimazole. CHA2DS2-VASc of 2. Two week Zio patch study ordered by his cardiologist, to start 04/17/2022.  -See Plan in # 1  3) S/P AVR with ascending aorta and distal hemiarch reconstruction, surgical maze with left atrial appendage clipping 12/2018.   4) Chronic combined systolic and diastolic CHF. LV EF 65 - 70% Per ECO 08/2019.   5) CKD stag III  6) History of mild normocytic anemia. Hg 12.4 per labs 07/27/2019.  -Recommend CBC if not done recently by PCP   CC:  Crist Fat, MD

## 2022-04-16 NOTE — Telephone Encounter (Signed)
Currently wearing cardiac monitor. Recommend that we await results prior to determining if he can be cleared with virtual visit  Levi Aland, NP-C 04/16/2022, 3:28 PM [address] Office [phone] Fax [phone]

## 2022-04-16 NOTE — Telephone Encounter (Signed)
Patient with diagnosis of afib and On-X AVR on warfarin for anticoagulation.    Procedure: colonoscopy Date of procedure: TBD  CHA2DS2-VASc Score = 2  This indicates a 2.2% annual risk of stroke. The patient's score is based upon: CHF History: 1 HTN History: 1 Diabetes History: 0 Stroke History: 0 Vascular Disease History: 0 Age Score: 0 Gender Score: 0   CrCl 21mL/min Platelet count 218K  INR range currently 1.5-2 due to On-X AVR. With concomitant afib, would likely benefit from INR range of 2-3, will forward to MD for input. Will also clarify potential need for Lovenox bridging.  **This guidance is not considered finalized until pre-operative APP has relayed final recommendations.**

## 2022-04-16 NOTE — Patient Instructions (Signed)
Colonoscopy to be scheduled once cardiac clearance is received.  Thank you for trusting me with your gastrointestinal care!   Alcide Evener, CRNP

## 2022-04-16 NOTE — Telephone Encounter (Signed)
[facility] Pre-operative Risk Assessment     Request for surgical clearance:     Endoscopy Procedure  What type of surgery is being performed?     Colonoscopy  When is this surgery scheduled?     TBD  What type of clearance is required ?   Pharmacy  Are there any medications that need to be held prior to surgery and how long? Coumadin 5 days.  Practice name and name of physician performing surgery?      [facility]  What is your office phone and fax number?      Phone- [phone]  Fax- [phone]  Anesthesia type (None, local, MAC, general) ?       MAC    [facility] Pre-operative Risk Assessment     Request for surgical clearance:     Endoscopy Procedure  What type of surgery is being performed?     Colonoscopy  When is this surgery scheduled?     TBD  What type of clearance is required ?   Pharmacy  Are there any medications that need to be held prior to surgery and how long? Coumadin & 5 days  Practice name and name of physician performing surgery?      [facility]  What is your office phone and fax number?      Phone- [phone]  [phone]  Anesthesia type (None, local, MAC, general) ?       MAC

## 2022-04-17 NOTE — Telephone Encounter (Signed)
Will forward to [facility] where pt is followed - will need INR range increased back to 2-3 at next visit. Will also require bridging with Lovenox while off of warfarin for 5 days prior to procedure, date not scheduled yet.

## 2022-04-17 NOTE — Telephone Encounter (Signed)
Coumadin Clinic will increase INR goal to 2-3 at next visit and also provide pt with Lovenox bridge instructions when procedure is scheduled.

## 2022-04-17 NOTE — Progress Notes (Signed)
I agree with the assessment and plan as outlined by Ms. Kennedy-Smith. 

## 2022-04-20 NOTE — Telephone Encounter (Signed)
Dr. Leonides Schanz, refer to office visit 04/16/2022. See note from cardiologist Dr. Dulce Sellar below, do you recommend Cologuard for this patient? If a conventional colonoscopy pursued, he will require Lovenox  bridge. Cardiac clearance pending 14 day Zio patch monitory.   The last labs in Epic showed mild anemia. However, patient stated he had labs done by his PCP within the pas month or two (results not in Epic) which he believes were ok. He will obtain a copy of these results for our review.

## 2022-04-23 ENCOUNTER — Other Ambulatory Visit: Payer: Self-pay

## 2022-04-23 DIAGNOSIS — D649 Anemia, unspecified: Secondary | ICD-10-CM

## 2022-04-23 NOTE — Telephone Encounter (Signed)
Dr. Lorenso Courier, labs received from his PCP dated 03/03/2022 as follows: WBC 6.2. Hg 11.5. HCT 34.3. MCV 83. BUN 23. Cr 1.51 (0.76-1.27). T. Bili 0.4. Alk phos 61. AST 26. ALT 32.   I will send patient to our lab for a repeat CBC and IBC + ferritin panel and B12 level.   His renal disease a contributing factor but will rule out concomitant iron deficiency anemia.   If he is iron deficient, he will need EGD and colonoscopy. Cardiac clearance pending and he will require Lovenox bridge.    I will forward the lab results to you when received.   Remo Lipps, pls contact patient and let him know I did receive his lab results from his pcp and provide him with a lab order for the blood tests as noted above. I will contact patient with finalized GI plan after labs received. THX

## 2022-04-23 NOTE — Telephone Encounter (Addendum)
Pt made aware of Alcide Evener NP recommendations: Orders for labs placed in Epic: Pt made aware: Location to lab given:  Pt verbalized understanding with all questions answered.

## 2022-04-24 ENCOUNTER — Telehealth: Payer: Self-pay

## 2022-04-24 NOTE — Telephone Encounter (Signed)
Primary care note 03/03/22 and labs received, forward to Dr Dulce Sellar for review

## 2022-05-05 ENCOUNTER — Ambulatory Visit: Payer: Medicaid Other | Attending: Cardiology

## 2022-05-05 DIAGNOSIS — I480 Paroxysmal atrial fibrillation: Secondary | ICD-10-CM

## 2022-05-05 DIAGNOSIS — Z7901 Long term (current) use of anticoagulants: Secondary | ICD-10-CM | POA: Diagnosis not present

## 2022-05-05 DIAGNOSIS — Q231 Congenital insufficiency of aortic valve: Secondary | ICD-10-CM | POA: Diagnosis not present

## 2022-05-05 DIAGNOSIS — Z5181 Encounter for therapeutic drug level monitoring: Secondary | ICD-10-CM | POA: Diagnosis not present

## 2022-05-05 LAB — POCT INR: INR: 1.2 — AB (ref 2.0–3.0)

## 2022-05-05 NOTE — Patient Instructions (Signed)
Description   Take 2 tablets today and then START taking warfarin 1 tablet daily except 0.5 tablet on Mondays, Wednesdays and Fridays.  Recheck INR in 2 weeks.  Stay consistent with greens week  Call Coumadin Clinic with any medicaiton changes or up coming procedures.  [facility] Clinic # [phone]

## 2022-05-06 ENCOUNTER — Other Ambulatory Visit (INDEPENDENT_AMBULATORY_CARE_PROVIDER_SITE_OTHER): Payer: Medicaid Other

## 2022-05-06 DIAGNOSIS — D649 Anemia, unspecified: Secondary | ICD-10-CM | POA: Diagnosis not present

## 2022-05-06 LAB — CBC WITH DIFFERENTIAL/PLATELET
Basophils Absolute: 0.1 10*3/uL (ref 0.0–0.1)
Basophils Relative: 0.7 % (ref 0.0–3.0)
Eosinophils Absolute: 0.1 10*3/uL (ref 0.0–0.7)
Eosinophils Relative: 1.4 % (ref 0.0–5.0)
HCT: 45.3 % (ref 39.0–52.0)
Hemoglobin: 15 g/dL (ref 13.0–17.0)
Lymphocytes Relative: 14.5 % (ref 12.0–46.0)
Lymphs Abs: 1.1 10*3/uL (ref 0.7–4.0)
MCHC: 33.1 g/dL (ref 30.0–36.0)
MCV: 84.7 fl (ref 78.0–100.0)
Monocytes Absolute: 0.7 10*3/uL (ref 0.1–1.0)
Monocytes Relative: 9.5 % (ref 3.0–12.0)
Neutro Abs: 5.5 10*3/uL (ref 1.4–7.7)
Neutrophils Relative %: 73.9 % (ref 43.0–77.0)
Platelets: 210 10*3/uL (ref 150.0–400.0)
RBC: 5.35 Mil/uL (ref 4.22–5.81)
RDW: 16.9 % — ABNORMAL HIGH (ref 11.5–15.5)
WBC: 7.4 10*3/uL (ref 4.0–10.5)

## 2022-05-06 LAB — IBC + FERRITIN
Ferritin: 37.4 ng/mL (ref 22.0–322.0)
Iron: 82 ug/dL (ref 42–165)
Saturation Ratios: 27.4 % (ref 20.0–50.0)
TIBC: 299.6 ug/dL (ref 250.0–450.0)
Transferrin: 214 mg/dL (ref 212.0–360.0)

## 2022-05-06 LAB — VITAMIN B12: Vitamin B-12: 336 pg/mL (ref 211–911)

## 2022-05-13 ENCOUNTER — Other Ambulatory Visit: Payer: Self-pay

## 2022-05-13 DIAGNOSIS — Z1211 Encounter for screening for malignant neoplasm of colon: Secondary | ICD-10-CM

## 2022-05-19 ENCOUNTER — Encounter: Payer: Self-pay | Admitting: Cardiology

## 2022-05-19 ENCOUNTER — Ambulatory Visit: Payer: Medicaid Other | Attending: Cardiology

## 2022-05-19 DIAGNOSIS — Z5181 Encounter for therapeutic drug level monitoring: Secondary | ICD-10-CM

## 2022-05-19 DIAGNOSIS — I480 Paroxysmal atrial fibrillation: Secondary | ICD-10-CM

## 2022-05-19 DIAGNOSIS — Q231 Congenital insufficiency of aortic valve: Secondary | ICD-10-CM

## 2022-05-19 DIAGNOSIS — Z7901 Long term (current) use of anticoagulants: Secondary | ICD-10-CM

## 2022-05-19 LAB — POCT INR: INR: 1.2 — AB (ref 2.0–3.0)

## 2022-05-19 NOTE — Patient Instructions (Signed)
Description   Take 2 tablets today and then START taking warfarin 1 tablet daily except 0.5 tablet on Fridays.  Recheck INR in 2 weeks.  Stay consistent with greens week  Call Coumadin Clinic with any medicaiton changes or up coming procedures.  C[facility] Clinic # [phone]

## 2022-05-28 ENCOUNTER — Other Ambulatory Visit: Payer: Self-pay

## 2022-05-28 MED ORDER — METHIMAZOLE 10 MG PO TABS: 10.0000 mg | ORAL_TABLET | Freq: Every day | ORAL | 3 refills | Status: DC

## 2022-06-02 ENCOUNTER — Ambulatory Visit: Payer: Medicaid Other | Attending: Cardiology

## 2022-06-02 DIAGNOSIS — Z7901 Long term (current) use of anticoagulants: Secondary | ICD-10-CM | POA: Diagnosis not present

## 2022-06-02 DIAGNOSIS — Q231 Congenital insufficiency of aortic valve: Secondary | ICD-10-CM

## 2022-06-02 DIAGNOSIS — Z5181 Encounter for therapeutic drug level monitoring: Secondary | ICD-10-CM | POA: Diagnosis not present

## 2022-06-02 DIAGNOSIS — I480 Paroxysmal atrial fibrillation: Secondary | ICD-10-CM | POA: Diagnosis not present

## 2022-06-02 LAB — POCT INR: INR: 1.2 — AB (ref 2.0–3.0)

## 2022-06-02 NOTE — Patient Instructions (Signed)
Description   Take 2 tablets today and then START taking warfarin 1 tablet daily.  Recheck INR in 2 weeks.  Stay consistent with greens week  Call Coumadin Clinic with any medicaiton changes or up coming procedures.  Coumadin Clinic # [phone]

## 2022-06-03 ENCOUNTER — Encounter: Payer: Self-pay | Admitting: Internal Medicine

## 2022-06-03 ENCOUNTER — Ambulatory Visit: Payer: Medicaid Other | Admitting: Internal Medicine

## 2022-06-03 VITALS — BP 124/78 | HR 64 | Temp 97.5°F | Resp 18 | Ht 71.0 in | Wt 176.4 lb

## 2022-06-03 DIAGNOSIS — I480 Paroxysmal atrial fibrillation: Secondary | ICD-10-CM

## 2022-06-03 DIAGNOSIS — N1831 Chronic kidney disease, stage 3a: Secondary | ICD-10-CM

## 2022-06-03 DIAGNOSIS — I10 Essential (primary) hypertension: Secondary | ICD-10-CM

## 2022-06-03 DIAGNOSIS — N183 Chronic kidney disease, stage 3 unspecified: Secondary | ICD-10-CM

## 2022-06-03 DIAGNOSIS — R490 Dysphonia: Secondary | ICD-10-CM

## 2022-06-03 DIAGNOSIS — E0590 Thyrotoxicosis, unspecified without thyrotoxic crisis or storm: Secondary | ICD-10-CM

## 2022-06-03 HISTORY — DX: Dysphonia: R49.0

## 2022-06-03 HISTORY — DX: Essential (primary) hypertension: I10

## 2022-06-03 HISTORY — DX: Thyrotoxicosis, unspecified without thyrotoxic crisis or storm: E05.90

## 2022-06-03 NOTE — Assessment & Plan Note (Signed)
His A. Fib is seems rate controlled.  I see his zio patch results where Dr. Curt Bears will call him and update him.

## 2022-06-03 NOTE — Assessment & Plan Note (Signed)
I want him to avoid any NSAIDs and continue on jardiance at this time.

## 2022-06-03 NOTE — Progress Notes (Signed)
Office Visit  Subjective   Patient ID: Demetria Lightsey   DOB: 1960/12/22   Age: 62 y.o.   MRN: [id number]   Chief Complaint Chief Complaint  Patient presents with   Follow-up     History of Present Illness The patient tells me he has acute hoarseness of his voice that started acutely about 2 weeks ago.  He denies any respiratory symptoms and no acute weakness or neurologic change.  This is a 62 year old Caucasian/White male who has paroxysmal atrial fibrillation which was diagnosed in 12/2018 when he was hospitalized for his CHF and aortic stenosis. He states he was initially told he had A. Fib when he went for a heart catherization prior to his aortic valve replacement. His atrial fibrillation is controlled with therapy as summarized in the medication list and previous notes. Specifically denied complaints: chest pain, TIAs, orthopnea, edema, exertional dyspnea, and syncope. Anticoagulation status: therapeutic anticoagulation-see lab section. The patient was placed on a beta blocker as well as amiodarone. He underwent a Maze procedure and clipping of the atrial appendage on 12/16/2018. He is followed by cardiology for his coumadin levels.   He did see Dr. Curt Bears in electrophysiology in 04/2022 where he noted he was on labetalol 100 mg twice daily and warfarin.  He felt his CHA2DS2-VASc score was a 2.  He reviewed his cardia mobile strips and it was unclear to him whether or not he is having episodes of atrial fibrillation.  He had PACs on his ECG which further complicated this.  The patient had a zio patch placed and on review of these results he had a predominant underlying sinus rhythm with some runs of SVT.     Mr. 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  I saw him late 01/2022 where he presented with complaints of weight loss, SOB and body trembling.  He saw Dr. Bettina Gavia in 06/2021 where his weight was 186 lbs.  Today on our scales he  weighs 165 lbs.   The patient states he began having SOB where prior to that he can go up and down steps including at the beach where a few weeks ago he could go up 3 flights of stairs without SOB. He stated he had lethargy, fatigue and trembling.  He first noticed his whole body trembling about a month prior.  I did lab testing and discovered that his TSH was low and felt he had hyperthyroidism due to his amiodarone.  I did contact his cardiologist and I started him on methiazole 10mg  daily and obtained a thyroid US.  This thyroid US was done on 02/12/2022 and this showed a thyroid of normal size and no hypervascularity.   Today, his energy and fatigue and SOB are all improved and he has started back to hiking.  He has not had any TFT' done in the last 3 months.  The patient is a 62 year old male who presents for a follow-up evaluation of hypertension.  On his last visit, I was concerend about his BP being elevated.  The patient has not been checking his blood pressure at home.  The patient's current medications include: hydralazine 75mg  BID and labetalol 100mg  BID.  He has lasix prn only for leg swelling but he has not had to take this medication in months.  He was previously on amlodipine 10mg  daily but he stopped this due to foot edema. The patient denies any headache, visual changes,  lightheadness, shortness of breath, and weakness/numbness. He reports there have been no other symptoms noted.   The patient has Stage IIIa CKD which they noted while he was hospitalized in 12/2018.  Again, he had acute renal failure with a creatinine of 1.6 but they wondered if this was longstanding CKD.  He denies any NSAID use.  I believe his Stage III CKD is secondary to longstanding hypertension.  I did start him on Jardiance 10mg  daily in 02/2022 for his CKD and history of CHF.       Past Medical History Past Medical History:  Diagnosis Date   AAA (abdominal aortic aneurysm) (HCC)    Aortic stenosis    CHF  (congestive heart failure) (HCC)    Chronic kidney disease    stage 3   Dysrhythmia    A-fib   Hypertension      Allergies No Known Allergies   Medications  Current Outpatient Medications:    aspirin 81 MG EC tablet, Take 81 mg by mouth daily. , Disp: , Rfl:    atorvastatin (LIPITOR) 40 MG tablet, Take 1 tablet (40 mg total) by mouth daily., Disp: 90 tablet, Rfl: 3   furosemide (LASIX) 20 MG tablet, Take 20 mg by mouth daily as needed (as directed)., Disp: , Rfl:    hydrALAZINE (APRESOLINE) 25 MG tablet, TAKE 3 TABLETS BY MOUTH IN THE MORNING AND AT BEDTIME, Disp: 504 tablet, Rfl: 1   JARDIANCE 10 MG TABS tablet, Take 10 mg by mouth every morning., Disp: , Rfl:    labetalol (NORMODYNE) 100 MG tablet, Take 1 tablet by mouth twice daily, Disp: 180 tablet, Rfl: 1   methimazole (TAPAZOLE) 10 MG tablet, Take 1 tablet (10 mg total) by mouth daily., Disp: 30 tablet, Rfl: 3   warfarin (COUMADIN) 2.5 MG tablet, TAKE 1 TABLET BY MOUTH ONCE DAILY OR AS  DIRECTED BY COUMADIN  CLINIC, Disp: 100 tablet, Rfl: 0   Review of Systems Review of Systems  Constitutional:  Negative for chills, diaphoresis, fever and malaise/fatigue.  Eyes:  Negative for blurred vision and double vision.  Respiratory:  Negative for cough and shortness of breath.   Cardiovascular:  Positive for palpitations. Negative for chest pain and leg swelling.  Gastrointestinal:  Positive for constipation. Negative for abdominal pain, diarrhea, nausea and vomiting.  Musculoskeletal:  Negative for myalgias.  Skin:  Negative for itching and rash.  Neurological:  Negative for dizziness, weakness and headaches.       Objective:    Vitals BP 124/78 (BP Location: Left Arm, Patient Position: Sitting, Cuff Size: Normal)   Pulse 64   Temp (!) 97.5 F (36.4 C) (Temporal)   Resp 18   Ht 5\' 11"  (1.803 m)   Wt 176 lb 6.4 oz (80 kg)   SpO2 98%   BMI 24.60 kg/m    Physical Examination Physical Exam Constitutional:       Appearance: Normal appearance. He is not ill-appearing.  Cardiovascular:     Rate and Rhythm: Normal rate and regular rhythm.     Pulses: Normal pulses.     Heart sounds: No murmur heard.    No friction rub. No gallop.  Pulmonary:     Effort: Pulmonary effort is normal. No respiratory distress.     Breath sounds: No wheezing, rhonchi or rales.  Abdominal:     General: Abdomen is flat. Bowel sounds are normal. There is no distension.     Palpations: Abdomen is soft.     Tenderness: There  is no abdominal tenderness.  Musculoskeletal:     Right lower leg: No edema.     Left lower leg: No edema.  Skin:    General: Skin is warm and dry.     Findings: No rash.  Neurological:     General: No focal deficit present.     Mental Status: He is alert and oriented to person, place, and time.  Psychiatric:        Mood and Affect: Mood normal.        Behavior: Behavior normal.        Assessment & Plan:   Atrial fibrillation ([place]) His A. Fib is seems rate controlled.  I see his zio patch results where Dr. Curt Bears will call him and update him.  Hyperthyroidism The patient seems euthryoid.  We will check a TFT's on him at this time.  He is off amiodraone.  CKD (chronic kidney disease) stage 3, GFR 30-59 ml/min (HCC) I want him to avoid any NSAIDs and continue on jardiance at this time.  Hoarseness He has hoarseness without any other symptoms.  I am going to refer him to ENT at this time.  Essential hypertension His BP is currently controlled.  We will cotninue on his current meds.    Return in about 3 months (around 09/01/2022).   Townsend Roger, MD

## 2022-06-03 NOTE — Assessment & Plan Note (Signed)
He has hoarseness without any other symptoms.  I am going to refer him to ENT at this time.

## 2022-06-03 NOTE — Assessment & Plan Note (Signed)
The patient seems euthryoid.  We will check a TFT's on him at this time.  He is off amiodraone.

## 2022-06-03 NOTE — Assessment & Plan Note (Signed)
His BP is currently controlled.  We will cotninue on his current meds.

## 2022-06-04 ENCOUNTER — Encounter: Payer: Self-pay | Admitting: Cardiology

## 2022-06-04 LAB — TSH: TSH: 24 u[IU]/mL — ABNORMAL HIGH (ref 0.450–4.500)

## 2022-06-04 LAB — T4, FREE: Free T4: 0.79 ng/dL — ABNORMAL LOW (ref 0.82–1.77)

## 2022-06-09 NOTE — Telephone Encounter (Signed)
Left message to call back See result note for further

## 2022-06-10 ENCOUNTER — Other Ambulatory Visit: Payer: Self-pay | Admitting: Internal Medicine

## 2022-06-11 ENCOUNTER — Other Ambulatory Visit: Payer: Self-pay

## 2022-06-16 ENCOUNTER — Ambulatory Visit: Payer: Medicaid Other | Attending: Cardiology

## 2022-06-16 DIAGNOSIS — Z5181 Encounter for therapeutic drug level monitoring: Secondary | ICD-10-CM

## 2022-06-16 DIAGNOSIS — I480 Paroxysmal atrial fibrillation: Secondary | ICD-10-CM | POA: Diagnosis not present

## 2022-06-16 LAB — POCT INR: INR: 1.3 — AB (ref 2.0–3.0)

## 2022-06-16 NOTE — Patient Instructions (Signed)
Description   Take 2 tablets today and then START taking warfarin 1 tablet daily EXCEPT 2 tablets on Sundays  Recheck INR in 2 weeks.  Stay consistent with greens week  Call Coumadin Clinic with any medicaiton changes or up coming procedures.  Coumadin Clinic # [phone]

## 2022-06-18 ENCOUNTER — Telehealth: Payer: Self-pay | Admitting: *Deleted

## 2022-06-18 ENCOUNTER — Telehealth: Payer: Self-pay | Admitting: Cardiology

## 2022-06-18 ENCOUNTER — Encounter: Payer: Self-pay | Admitting: Pharmacist

## 2022-06-18 NOTE — Telephone Encounter (Signed)
Had a phone call with Joe Klein today witnessed by Joe Klein PharmD.  Made patient aware that his behavior at his Coumadin appointment  this week left the RN uncomfortable.  I made him aware that his request to learn about her marital status & sexual activity even after she requested he stop with that line of questioning and his attempts make her feel he was their as a friend  and she could talk to him was not appropriate for the work/clinic environment.  He was made aware that because of this incident that Joe Klein was offering an alternative option to continue to follow him using the [employer] on site at [employer] to check his levels and Joe Klein or someone else from his team would call him with results. This is with understanding the described behavior do not come back.    Joe Klein reported he understood and offered that his primary care office may be able to help him with this and Joe Klein was ok with this plan.  Joe Klein agreed to check with his primary care office and get back with Joe Klein via [place].  Joe Klein agreed that if he did not hear from Joe Klein by next week he would reach out to him. Next Coumadin check is not until week of 06/29/2022.   Joe Klein did request to share his side of the story.  He shared that it was not him having this conversation it was the [facility] that led him to have this conversation and that he loved and cared a lot about this individual.  I offered the work setting is not appropriate to have these conversations. We agreed to end the conversation and have a plan in place Joe Klein reports to be comfortable with.

## 2022-06-18 NOTE — Telephone Encounter (Signed)
Patient states he is returning a call from a male, but he is unsure of what the call was regarding.

## 2022-06-19 NOTE — Telephone Encounter (Signed)
Hi, yes we spoke with him yesterday

## 2022-06-21 ENCOUNTER — Encounter: Payer: Self-pay | Admitting: Internal Medicine

## 2022-07-01 ENCOUNTER — Other Ambulatory Visit: Payer: Self-pay | Admitting: Internal Medicine

## 2022-07-02 ENCOUNTER — Encounter: Payer: Self-pay | Admitting: Internal Medicine

## 2022-07-02 LAB — PROTIME-INR
INR: 1.6 — ABNORMAL HIGH (ref 0.9–1.2)
Prothrombin Time: 16.4 s — ABNORMAL HIGH (ref 9.1–12.0)

## 2022-07-07 NOTE — Telephone Encounter (Signed)
PCP managing pt's warfarin now.

## 2022-07-13 ENCOUNTER — Other Ambulatory Visit: Payer: Self-pay

## 2022-07-13 DIAGNOSIS — I480 Paroxysmal atrial fibrillation: Secondary | ICD-10-CM

## 2022-07-13 DIAGNOSIS — Z7901 Long term (current) use of anticoagulants: Secondary | ICD-10-CM

## 2022-07-13 DIAGNOSIS — Z5181 Encounter for therapeutic drug level monitoring: Secondary | ICD-10-CM

## 2022-07-15 ENCOUNTER — Other Ambulatory Visit: Payer: Self-pay | Admitting: Internal Medicine

## 2022-07-16 ENCOUNTER — Encounter: Payer: Self-pay | Admitting: Internal Medicine

## 2022-07-16 LAB — PROTIME-INR
INR: 2 — ABNORMAL HIGH (ref 0.9–1.2)
Prothrombin Time: 20.4 s — ABNORMAL HIGH (ref 9.1–12.0)

## 2022-07-27 ENCOUNTER — Other Ambulatory Visit: Payer: Self-pay | Admitting: Cardiology

## 2022-07-27 ENCOUNTER — Telehealth: Payer: Self-pay | Admitting: Cardiology

## 2022-07-27 DIAGNOSIS — I480 Paroxysmal atrial fibrillation: Secondary | ICD-10-CM

## 2022-07-27 MED ORDER — WARFARIN SODIUM 2.5 MG PO TABS: ORAL_TABLET | ORAL | 0 refills | Status: DC

## 2022-07-27 NOTE — Telephone Encounter (Signed)
Pt is having INR managed by PCP and getting blood drawn at [place].  Future refills should go to PCP.

## 2022-07-27 NOTE — Telephone Encounter (Signed)
Yes, patient's goal is 2-3 and is documented. In his record. Warfarin and INR managed by PCP

## 2022-07-27 NOTE — Telephone Encounter (Signed)
Refill request for warfarin:  Last INR was 1.3 on 06/16/22 Next INR due 06/30/22 LOV 04/06/22  Elliot Cousin MD  Pt is past due for INR appt.  Message sent to schedulers to call and make INR appt in [place].  Refill approved x 1 only.

## 2022-07-27 NOTE — Telephone Encounter (Signed)
Pt c/o medication issue:  1. Name of Medication: Amiodarone and Warfarin  2. How are you currently taking this medication (dosage and times per day)? No longer taking Amiodarone and taking Warfarin as prescribed  3. Are you having a reaction (difficulty breathing--STAT)? no  4. What is your medication issue? Patient states that he is no longer taking amiodarone because he has a mechanical valve. He states that right now he is not taking any medications for afib and therefore, Dr. Bettina Gavia increased his ranges for Warfarin from 1.5-2 to 2-3. He wants to make sure this is correct and if it is documented in his chart for reference. I was unable to locate whether or not his ranges were increased and advised I would send a message over. He also wants to make it known that he gets his coumadin checked at [place] now and not at the coumadin clinic in [place].

## 2022-07-27 NOTE — Telephone Encounter (Signed)
Patient needs refill on warfarin sent to [facility] on [address].

## 2022-07-30 ENCOUNTER — Other Ambulatory Visit: Payer: Self-pay | Admitting: Internal Medicine

## 2022-07-31 LAB — PROTIME-INR
INR: 2.5 — ABNORMAL HIGH (ref 0.9–1.2)
Prothrombin Time: 25 s — ABNORMAL HIGH (ref 9.1–12.0)

## 2022-08-05 ENCOUNTER — Encounter: Payer: Self-pay | Admitting: Internal Medicine

## 2022-08-17 ENCOUNTER — Ambulatory Visit: Payer: Medicaid Other | Admitting: Cardiology

## 2022-08-21 ENCOUNTER — Other Ambulatory Visit: Payer: Self-pay | Admitting: Internal Medicine

## 2022-08-21 ENCOUNTER — Other Ambulatory Visit: Payer: Self-pay | Admitting: Cardiology

## 2022-08-21 ENCOUNTER — Encounter: Payer: Self-pay | Admitting: Cardiology

## 2022-08-21 DIAGNOSIS — I480 Paroxysmal atrial fibrillation: Secondary | ICD-10-CM

## 2022-08-21 DIAGNOSIS — Z952 Presence of prosthetic heart valve: Secondary | ICD-10-CM

## 2022-08-21 DIAGNOSIS — I10 Essential (primary) hypertension: Secondary | ICD-10-CM

## 2022-08-24 MED ORDER — EMPAGLIFLOZIN 10 MG PO TABS: 10.0000 mg | ORAL_TABLET | Freq: Every morning | ORAL | 0 refills | Status: DC

## 2022-08-24 MED ORDER — HYDRALAZINE HCL 25 MG PO TABS: ORAL_TABLET | ORAL | 1 refills | Status: DC

## 2022-08-24 MED ORDER — LABETALOL HCL 100 MG PO TABS
100.0000 mg | ORAL_TABLET | Freq: Two times a day (BID) | ORAL | 1 refills | Status: DC
Start: 2022-08-24 — End: 2023-02-22

## 2022-08-24 NOTE — Telephone Encounter (Signed)
Pt's Warfarin and INR are now managed by pt's PCP per multiple telephone notes in Epic, rx should be forwarded to Dr Crist Fat. Rx refill denied to pharmacy.

## 2022-08-25 ENCOUNTER — Encounter: Payer: Self-pay | Admitting: Cardiology

## 2022-08-29 ENCOUNTER — Other Ambulatory Visit: Payer: Self-pay | Admitting: Cardiology

## 2022-08-29 DIAGNOSIS — I480 Paroxysmal atrial fibrillation: Secondary | ICD-10-CM

## 2022-08-31 ENCOUNTER — Encounter: Payer: Self-pay | Admitting: Internal Medicine

## 2022-08-31 ENCOUNTER — Ambulatory Visit: Payer: Medicaid Other | Admitting: Internal Medicine

## 2022-08-31 VITALS — BP 148/88 | HR 71 | Temp 97.9°F | Resp 16 | Ht 72.0 in | Wt 182.0 lb

## 2022-08-31 DIAGNOSIS — N1831 Chronic kidney disease, stage 3a: Secondary | ICD-10-CM | POA: Diagnosis not present

## 2022-08-31 DIAGNOSIS — N183 Chronic kidney disease, stage 3 unspecified: Secondary | ICD-10-CM

## 2022-08-31 DIAGNOSIS — E0590 Thyrotoxicosis, unspecified without thyrotoxic crisis or storm: Secondary | ICD-10-CM | POA: Diagnosis not present

## 2022-08-31 NOTE — Progress Notes (Addendum)
Office Visit  Subjective   Patient ID: Joe Klein   DOB: 12-Jul-1960   Age: 62 y.o.   MRN: [id number]   Chief Complaint Chief Complaint  Patient presents with   Follow-up     History of Present Illness Mr. 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   On her last visit 3 months ago, his TSH was elevated and I had a discussion with Dr. Mathis Bud and we decided to stop his methimazole and have him come back for repeat TFT's.  Again, I saw him late 01/2022 where he presented with complaints of weight loss, SOB and body trembling.  He saw Dr. Dulce Sellar in 06/2021 where his weight was 186 lbs. His weight in 01/2022 went down to 165 lbs.   The patient states he began having SOB where prior to that he can go up and [place] steps including at the beach where a few weeks ago he could go up 3 flights of stairs without SOB. He stated he had lethargy, fatigue and trembling.  He first noticed his whole body trembling about a month prior.  I did lab testing and discovered that his TSH was low and felt he had hyperthyroidism due to his amiodarone.  I did contact his cardiologist and I started him on methiazole 10mg  daily and obtained a thyroid US.  This thyroid US was done on 02/12/2022 and this showed a thyroid of normal size and no hypervascularity.   Today, his energy and fatigue and SOB are all improved and he has started back to hiking.  Also over the interim, the hoarseness of his voice resolved and he never went to see ENT.     Past Medical History Past Medical History:  Diagnosis Date   AAA (abdominal aortic aneurysm) (HCC)    Aortic stenosis    CHF (congestive heart failure) (HCC)    Chronic kidney disease    stage 3   Dysrhythmia    A-fib   Hypertension      Allergies No Known Allergies   Medications  Current Outpatient Medications:    aspirin 81 MG EC tablet, Take 81 mg by mouth daily. , Disp: , Rfl:    atorvastatin (LIPITOR) 40  MG tablet, Take 1 tablet (40 mg total) by mouth daily., Disp: 90 tablet, Rfl: 3   empagliflozin (JARDIANCE) 10 MG TABS tablet, Take 1 tablet (10 mg total) by mouth every morning., Disp: 90 tablet, Rfl: 0   furosemide (LASIX) 20 MG tablet, Take 20 mg by mouth daily as needed (as directed)., Disp: , Rfl:    hydrALAZINE (APRESOLINE) 25 MG tablet, TAKE 3 TABLETS BY MOUTH IN THE MORNING AND AT BEDTIME, Disp: 504 tablet, Rfl: 1   labetalol (NORMODYNE) 100 MG tablet, Take 1 tablet (100 mg total) by mouth 2 (two) times daily., Disp: 180 tablet, Rfl: 1   warfarin (COUMADIN) 2.5 MG tablet, TAKE 1 TO 2 TABLETS BY MOUTH ONCE DAILY OR  AS  DIRECTED  BY  COUMADIN  CLINIC, Disp: 30 tablet, Rfl: 0   Review of Systems Review of Systems  Constitutional:  Negative for chills, fever, malaise/fatigue and weight loss.  Eyes:  Negative for blurred vision.  Respiratory:  Negative for cough, shortness of breath and wheezing.   Cardiovascular:  Negative for chest pain, palpitations and leg swelling.  Gastrointestinal:  Negative for abdominal pain, constipation, diarrhea, nausea and vomiting.  Musculoskeletal:  Negative for myalgias.  Skin:  Negative  for itching and rash.  Neurological:  Negative for dizziness, weakness and headaches.       Objective:    Vitals BP (!) 148/88   Pulse 71   Temp 97.9 F (36.6 C)   Resp 16   Ht 6' (1.829 m)   Wt 182 lb (82.6 kg)   SpO2 98%   BMI 24.68 kg/m    Physical Examination Physical Exam Constitutional:      Appearance: Normal appearance. He is not ill-appearing.  Cardiovascular:     Rate and Rhythm: Normal rate and regular rhythm.     Pulses: Normal pulses.     Heart sounds: No murmur heard.    No friction rub. No gallop.  Pulmonary:     Effort: Pulmonary effort is normal. No respiratory distress.     Breath sounds: No wheezing, rhonchi or rales.  Abdominal:     General: Abdomen is flat. Bowel sounds are normal. There is no distension.     Palpations: Abdomen  is soft.     Tenderness: There is no abdominal tenderness.  Musculoskeletal:     Right lower leg: No edema.     Left lower leg: No edema.  Skin:    General: Skin is warm and dry.     Findings: No rash.  Neurological:     Mental Status: He is alert.        Assessment & Plan:   Hyperthyroidism He seems euthyroid today.  We did stop him methiamazole 3 months ago.  I will check his TSH and Free T4 today.  CKD (chronic kidney disease) stage 3, GFR 30-59 ml/min (HCC) He has been on Jardiance since 02/2022.  I am going to check his urine studies today.    Return in about 3 months (around 11/30/2022).   Crist Fat, MD

## 2022-08-31 NOTE — Assessment & Plan Note (Signed)
He seems euthyroid today.  We did stop him methiamazole 3 months ago.  I will check his TSH and Free T4 today.

## 2022-08-31 NOTE — Telephone Encounter (Signed)
Refill request for warfarin:  Last INR was 1.3 on 06/16/22 Past due for INR appointment. Has office visit scheduled with Dr Dulce Sellar on 09/29/22  Message sent to schedulers to schedule INR appt.  Warfarin refilled x 30 days only will time of appt.

## 2022-08-31 NOTE — Assessment & Plan Note (Signed)
He has been on Jardiance since 02/2022.  I am going to check his urine studies today.

## 2022-08-31 NOTE — Addendum Note (Signed)
Addended by: Crist Fat on: 08/31/2022 02:26 PM   Modules accepted: Orders

## 2022-09-01 ENCOUNTER — Telehealth: Payer: Self-pay | Admitting: Cardiology

## 2022-09-01 LAB — MICROALBUMIN / CREATININE URINE RATIO
Creatinine, Urine: 31.7 mg/dL
Microalb/Creat Ratio: 280 mg/g creat — ABNORMAL HIGH (ref 0–29)
Microalbumin, Urine: 88.7 ug/mL

## 2022-09-01 LAB — T4, FREE: Free T4: 1.3 ng/dL (ref 0.82–1.77)

## 2022-09-01 LAB — TSH: TSH: 3.41 u[IU]/mL (ref 0.450–4.500)

## 2022-09-01 NOTE — Telephone Encounter (Signed)
Patient is calling stating he is returning a call he received from the office today to setup an appt for his INR to be checked. Patient reports he is now having this checked at [facility] which was setup by his PCP.

## 2022-09-01 NOTE — Telephone Encounter (Signed)
Patient called and informed that he was called by accident. Patient was fine with that and had no further questions at this time.

## 2022-09-01 NOTE — Telephone Encounter (Signed)
Left voice mail to schedule INR.

## 2022-09-01 NOTE — Telephone Encounter (Signed)
Error

## 2022-09-04 ENCOUNTER — Other Ambulatory Visit: Payer: Self-pay | Admitting: Internal Medicine

## 2022-09-05 LAB — PROTIME-INR
INR: 2.6 — ABNORMAL HIGH (ref 0.9–1.2)
Prothrombin Time: 26.1 s — ABNORMAL HIGH (ref 9.1–12.0)

## 2022-09-07 ENCOUNTER — Encounter: Payer: Self-pay | Admitting: Internal Medicine

## 2022-09-07 ENCOUNTER — Telehealth: Payer: Self-pay

## 2022-09-07 NOTE — Telephone Encounter (Signed)
-----   Message from Crist Fat, MD sent at 09/03/2022 10:46 AM EDT ----- Tell him that his thyroid functions are normal and he does not need meds (he's off them).  His urine shows proteins but he is on jardiance.

## 2022-09-07 NOTE — Telephone Encounter (Signed)
Pt notified of lab results

## 2022-09-08 NOTE — Telephone Encounter (Signed)
Patient is returning call to inform the office again he is no longer getting his INR checked through [facility] as previously documented on 03/25 and 04/30. He was banned from the [place] coumadin clinic and now goes to [facility] to have it checked.

## 2022-09-08 NOTE — Telephone Encounter (Signed)
Left voicemail to schedule INR appt.

## 2022-09-14 ENCOUNTER — Ambulatory Visit: Payer: Medicaid Other | Attending: Cardiology | Admitting: Cardiology

## 2022-09-14 ENCOUNTER — Encounter: Payer: Self-pay | Admitting: Cardiology

## 2022-09-14 VITALS — BP 130/82 | HR 54 | Ht 72.0 in | Wt 179.8 lb

## 2022-09-14 DIAGNOSIS — D6869 Other thrombophilia: Secondary | ICD-10-CM

## 2022-09-14 DIAGNOSIS — Z952 Presence of prosthetic heart valve: Secondary | ICD-10-CM

## 2022-09-14 DIAGNOSIS — I480 Paroxysmal atrial fibrillation: Secondary | ICD-10-CM

## 2022-09-14 NOTE — Patient Instructions (Signed)
Medication Instructions:  °Your physician recommends that you continue on your current medications as directed. Please refer to the Current Medication list given to you today. ° °*If you need a refill on your cardiac medications before your next appointment, please call your pharmacy* ° ° °Lab Work: °None ordered ° ° °Testing/Procedures: °None ordered ° ° °Follow-Up: °At [facility], you and your health needs are our priority.  As part of our continuing mission to provide you with exceptional heart care, we have created designated Provider Care Teams.  These Care Teams include your primary Cardiologist (physician) and Advanced Practice Providers (APPs -  Physician Assistants and Nurse Practitioners) who all work together to provide you with the care you need, when you need it. ° °Your next appointment:   °1 year(s) ° °The format for your next appointment:   °In Person ° °Provider:   °Will Camnitz, MD ° ° ° °Thank you for choosing [facility]!! ° ° °Roderic Lammert, RN °[phone] ° ° ° °

## 2022-09-14 NOTE — Progress Notes (Signed)
Electrophysiology Office Note   Date:  09/14/2022   ID:  Joe Klein, DOB 09/27/1960, MRN [id number]  PCP:  Crist Fat, MD  Cardiologist:  Dulce Sellar Primary Electrophysiologist:  Endiya Klahr Jorja Loa, MD    Chief Complaint: AF   History of Present Illness: Joe Klein is a 62 y.o. male who is being seen today for the evaluation of AF at the request of Crist Fat, MD. Presenting today for electrophysiology evaluation.  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  He is post AVR with ascending aorta and distal hemiarch reconstruction, surgical maze with left atrial appendage clipping.  He wore a cardiac monitor post surgery that showed no episodes of atrial fibrillation.  He did have short episodes of SVT.  Today, denies symptoms of palpitations, chest pain, shortness of breath, orthopnea, PND, lower extremity edema, claudication, dizziness, presyncope, syncope, bleeding, or neurologic sequela. The patient is tolerating medications without difficulties.  Today he feels well.  He has no chest pain or shortness of breath.  He is able to do all of his daily activities without restriction.  He has noted no further episodes of atrial fibrillation.   Past Medical History:  Diagnosis Date   AAA (abdominal aortic aneurysm) (HCC)    Aortic stenosis    CHF (congestive heart failure) (HCC)    Chronic kidney disease    stage 3   Dysrhythmia    A-fib   Hypertension    Past Surgical History:  Procedure Laterality Date   AORTIC VALVE REPLACEMENT N/A 12/16/2018   Procedure: AORTIC VALVE REPLACEMENT (AVR);  Surgeon: Linden Dolin, MD;  Location: [facility] OR;  Service: Open Heart Surgery;  Laterality: N/A;   CARDIOVERSION N/A 11/21/2018   Procedure: CARDIOVERSION (CATH LAB);  Surgeon: Marinus Maw, MD;  [facility] INVASIVE CV LAB;  Service: Cardiovascular;  Laterality: N/A;   CLIPPING OF ATRIAL APPENDAGE Left  12/16/2018   Procedure: CLIPPING OF ATRIAL APPENDAGE;  Surgeon: Linden Dolin, MD;  Location: [facility] OR;  Service: Open Heart Surgery;  Laterality: Left;  Appendage clipping with AtriCure AtriClip 50.   MANDIBLE FRACTURE SURGERY     MAZE N/A 12/16/2018   Procedure: MAZE;  Surgeon: Linden Dolin, MD;  Location: [facility] OR;  Service: Open Heart Surgery;  Laterality: N/A;   RIGHT/LEFT HEART CATH AND CORONARY ANGIOGRAPHY N/A 11/18/2018   Procedure: RIGHT/LEFT HEART CATH AND CORONARY ANGIOGRAPHY;  Surgeon: Corky Crafts, MD;  Location: [facility] INVASIVE CV LAB;  Service: Cardiovascular;  Laterality: N/A;   TEE WITHOUT CARDIOVERSION N/A 12/16/2018   Procedure: TRANSESOPHAGEAL ECHOCARDIOGRAM (TEE);  Surgeon: Linden Dolin, MD;  Location: [facility] OR;  Service: Open Heart Surgery;  Laterality: N/A;   THORACIC AORTIC ANEURYSM REPAIR N/A 12/16/2018   Procedure: THORACIC ASCENDING ANEURYSM REPAIR;  Surgeon: Linden Dolin, MD;  Location: [facility] OR;  Service: Open Heart Surgery;  Laterality: N/A;   TONSILLECTOMY       Current Outpatient Medications  Medication Sig Dispense Refill   aspirin 81 MG EC tablet Take 81 mg by mouth daily.      atorvastatin (LIPITOR) 40 MG tablet Take 1 tablet (40 mg total) by mouth daily. 90 tablet 3   empagliflozin (JARDIANCE) 10 MG TABS tablet Take 1 tablet (10 mg total) by mouth every morning. 90 tablet 0   furosemide (LASIX) 20 MG tablet Take 20 mg by mouth daily as needed (as directed).     hydrALAZINE (APRESOLINE)  25 MG tablet TAKE 3 TABLETS BY MOUTH IN THE MORNING AND AT BEDTIME 504 tablet 1   labetalol (NORMODYNE) 100 MG tablet Take 1 tablet (100 mg total) by mouth 2 (two) times daily. 180 tablet 1   warfarin (COUMADIN) 2.5 MG tablet TAKE 1 TO 2 TABLETS BY MOUTH ONCE DAILY OR  AS  DIRECTED  BY  COUMADIN  CLINIC 30 tablet 0   No current facility-administered medications for this visit.    Allergies:   Patient has no known allergies.   Social History:  The patient  reports  that he has never smoked. He has never been exposed to tobacco smoke. He has never used smokeless tobacco. He reports that he does not drink alcohol and does not use drugs.   Family History:  The patient's family history includes Cancer in his father; Heart disease in his mother; Hypertension in his brother and mother.   ROS:  Please see the history of present illness.   Otherwise, review of systems is positive for none.   All other systems are reviewed and negative.   PHYSICAL EXAM: VS:  BP 130/82   Pulse (!) 54   Ht 6' (1.829 m)   Wt 179 lb 12.8 oz (81.6 kg)   SpO2 97%   BMI 24.39 kg/m  , BMI Body mass index is 24.39 kg/m. GEN: Well nourished, well developed, in no acute distress  HEENT: normal  Neck: no JVD, carotid bruits, or masses Cardiac:  RRR; no murmurs, rubs, or gallops,no edema  Respiratory:  clear to auscultation bilaterally, normal work of breathing GI: soft, nontender, nondistended, + BS MS: no deformity or atrophy  Skin: warm and dry Neuro:  Strength and sensation are intact Psych: euthymic mood, full affect  EKG:  EKG is ordered today. Personal review of the ekg ordered shows sinus rhythm, rate 54, PACs, left bundle branch block  Recent Labs: 02/18/2022: BUN 26; Creatinine, Ser 1.52; Potassium 4.7; Sodium 140 05/06/2022: Hemoglobin 15.0; Platelets 210.0 08/31/2022: TSH 3.410    Lipid Panel     Component Value Date/Time   CHOL 121 06/19/2021 1427   TRIG 82 06/19/2021 1427   HDL 35 (L) 06/19/2021 1427   CHOLHDL 3.5 06/19/2021 1427   LDLCALC 70 06/19/2021 1427     Wt Readings from Last 3 Encounters:  09/14/22 179 lb 12.8 oz (81.6 kg)  08/31/22 182 lb (82.6 kg)  06/03/22 176 lb 6.4 oz (80 kg)      Other studies Reviewed: Additional studies/ records that were reviewed today include: TTE 08/31/19  Review of the above records today demonstrates:   1. Left ventricular ejection fraction, by estimation, is 65 to 70%. The  left ventricle has normal function.  The left ventricle has no regional  wall motion abnormalities. There is moderate concentric left ventricular  hypertrophy. Left ventricular  diastolic parameters are consistent with Grade III diastolic dysfunction  (restrictive).   2. Right ventricular systolic function is normal. The right ventricular  size is normal. There is mildly elevated pulmonary artery systolic  pressure.   3. The mitral valve is normal in structure. Trivial mitral valve  regurgitation. No evidence of mitral stenosis.   4. Replacement of ascending aorta with super coronary graft implantation  (28 mm Hemashield platinum dacryon) proximally and a distal hemi-arch  reconstruction under hypothermic circulatory arrest (17 minutes with 15  minutes of antegrade cerebral  perfusion), aortic valve replacement (23 mm On-X mechanical valve),  bilateral pulmonary vein isolation and left atrial appendage  clipping. The  aortic valve is normal in structure. Aortic valve regurgitation 2 trivial  jets. No aortic stenosis is present.  There is a 23 mm On X On X valve present in the aortic position. Procedure  Date: 12/17/2018. Echo findings are consistent with normal structure and  function of the aortic valve prosthesis.   5. The inferior vena cava is normal in size with greater than 50%  respiratory variability, suggesting right atrial pressure of 3 mmHg.   Cardiac monitor 05/26/2022 personally reviewed Predominant underlying rhythm was sinus rhythm 20 SVT episodes, longest and fastest 12.8 seconds at 145 bpm Less than 1% ventricular ectopy 5.9% supraventricular ectopy Triggered episodes associated with sinus rhythm with supraventricular ectopy  ASSESSMENT AND PLAN:  1.  Paroxysmal atrial fibrillation: Currently on labetalol and warfarin.  CHA2DS2-VASc of 2.  He wore a recent cardiac monitor that showed no episodes of atrial fibrillation.  He is unaware of palpitations, fatigue, shortness of breath.  Takasha Vetere continue with  current management.  2.  Hypothyroidism: On methimazole.  Avoiding amiodarone.  3.  Status post AVR with aortic root replacement: Stable on most recent echo.  Plan per primary cardiology.  4.  Secondary hypercoagulable state: Currently on warfarin for atrial fibrillation and aortic valve replacement.   Current medicines are reviewed at length with the patient today.   The patient does not have concerns regarding his medicines.  The following changes were made today:  none  Labs/ tests ordered today include:  Orders Placed This Encounter  Procedures   EKG 12-Lead     Disposition:   FU 12 months  Signed, Long Brimage Jorja Loa, MD  09/14/2022 11:37 AM     [facility] [address] [place] [address] [phone] (office) [phone] (fax)

## 2022-09-16 ENCOUNTER — Other Ambulatory Visit: Payer: Self-pay | Admitting: Cardiology

## 2022-09-16 DIAGNOSIS — I480 Paroxysmal atrial fibrillation: Secondary | ICD-10-CM

## 2022-09-22 DIAGNOSIS — G4700 Insomnia, unspecified: Secondary | ICD-10-CM | POA: Insufficient documentation

## 2022-09-22 DIAGNOSIS — F419 Anxiety disorder, unspecified: Secondary | ICD-10-CM | POA: Insufficient documentation

## 2022-09-22 HISTORY — DX: Insomnia, unspecified: G47.00

## 2022-09-22 HISTORY — DX: Anxiety disorder, unspecified: F41.9

## 2022-09-28 NOTE — Progress Notes (Signed)
Cardiology Office Note:    Date:  09/29/2022   ID:  Joe Klein, DOB 05/31/1960, MRN [id number]  PCP:  Crist Fat, MD  Cardiologist:  Norman Herrlich, MD    Referring MD: Leonia Reader, Barbara Cower, MD    ASSESSMENT:    1. S/P AVR   2. Secondary hypercoagulable state (HCC)   3. Paroxysmal atrial fibrillation (HCC)   4. Essential hypertension    PLAN:    In order of problems listed above:  [place] is doing well following following resolution of amiodarone induced hyperthyroidism off suppressive therapy Continue warfarin now being managed by his PCP office Containing sinus rhythm presently without antiarrhythmic drug Continue his current antihypertensives Continue his statin Ask his PCP to do a CMP and lipid profile with his next pro time INR   Next appointment: 6 months   Medication Adjustments/Labs and Tests Ordered: Current medicines are reviewed at length with the patient today.  Concerns regarding medicines are outlined above.  No orders of the defined types were placed in this encounter.  No orders of the defined types were placed in this encounter.   Chief Complaint  Patient presents with   Follow-up   Atrial Fibrillation    History of Present Illness:    Joe Klein is a 62 y.o. 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  Heunderwent aortic valve replacement with a #23 On-X mechanical valve and repair thoracic ascending aorta with a Heema shield Dacron proximal and distal Hemi arch reconstruction Maze procedure and clipping at the atrial appendage 12/16/2018.  With discontinuation of amiodarone and suppressant therapy his thyroid is normal months.  Compliance with diet, lifestyle and medications: Yes  He is made  a full recovery from his anemia hypothyroidism again sinus rhythm Having no cardiovascular symptoms edema shortness of breath chest pain palpitation or syncope Pro time is being managed labs and follow-up in his PCP office Lipid profile 03/03/2022 LDL 54 cholesterol 90 Past Medical History:  Diagnosis Date   AAA (abdominal aortic aneurysm) (HCC)    Anxiety 09/22/2022   Aortic stenosis    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   Atrial fibrillation (HCC) 12/26/2018   Bicuspid aortic valve 10/25/2018   CHF (congestive heart failure) (HCC)    Chronic combined systolic and diastolic heart failure (HCC) 10/25/2018   Chronic kidney disease    stage 3   CKD (chronic kidney disease) stage 3, GFR 30-59 ml/min (HCC) 10/25/2018   Dysrhythmia    A-fib   Encounter for therapeutic drug monitoring 12/26/2018   Essential hypertension 06/03/2022   Hoarseness 06/03/2022   Hypertension    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 and stage 3 chronic kidney disease (HCC) 10/25/2018   Hyperthyroidism 06/03/2022   Insomnia 09/22/2022   Long term (current) use of anticoagulants 12/26/2018   S/P ascending aortic aneurysm repair 12/16/2018   Thoracic aortic aneurysm without rupture [facility])     Past Surgical History:  Procedure Laterality Date   AORTIC VALVE REPLACEMENT N/A 12/16/2018   Procedure: AORTIC VALVE REPLACEMENT (AVR);  Surgeon: Linden Dolin, MD;  Location: [facility] OR;  Service: Open Heart Surgery;  Laterality: N/A;   CARDIOVERSION N/A 11/21/2018   Procedure: CARDIOVERSION (CATH LAB);  Surgeon: Marinus Maw, MD;  Location: [facility] INVASIVE CV LAB;  Service: Cardiovascular;  Laterality: N/A;   CLIPPING OF ATRIAL APPENDAGE Left 12/16/2018   Procedure: CLIPPING OF ATRIAL APPENDAGE;  Surgeon: Linden Dolin, MD;  Location: [facility] OR;  Service: Open Heart Surgery;  Laterality: Left;  Appendage clipping with AtriCure AtriClip 50.   MANDIBLE FRACTURE SURGERY      MAZE N/A 12/16/2018   Procedure: MAZE;  Surgeon: Linden Dolin, MD;  Location: [facility] OR;  Service: Open Heart Surgery;  Laterality: N/A;   RIGHT/LEFT HEART CATH AND CORONARY ANGIOGRAPHY N/A 11/18/2018   Procedure: RIGHT/LEFT HEART CATH AND CORONARY ANGIOGRAPHY;  Surgeon: Corky Crafts, MD;  Location: [facility] INVASIVE CV LAB;  Service: Cardiovascular;  Laterality: N/A;   TEE WITHOUT CARDIOVERSION N/A 12/16/2018   Procedure: TRANSESOPHAGEAL ECHOCARDIOGRAM (TEE);  Surgeon: Linden Dolin, MD;  Location: [facility] OR;  Service: Open Heart Surgery;  Laterality: N/A;   THORACIC AORTIC ANEURYSM REPAIR N/A 12/16/2018   Procedure: THORACIC ASCENDING ANEURYSM REPAIR;  Surgeon: Linden Dolin, MD;  Location: [facility] OR;  Service: Open Heart Surgery;  Laterality: N/A;   TONSILLECTOMY      Current Medications: Current Meds  Medication Sig   aspirin 81 MG EC tablet Take 81 mg by mouth daily.    atorvastatin (LIPITOR) 40 MG tablet Take 1 tablet (40 mg total) by mouth daily.   empagliflozin (JARDIANCE) 10 MG TABS tablet Take 1 tablet (10 mg total) by mouth every morning.   furosemide (LASIX) 20 MG tablet Take 20 mg by mouth daily as needed (as directed).   hydrALAZINE (APRESOLINE) 25 MG tablet TAKE 3 TABLETS BY MOUTH IN THE MORNING AND AT BEDTIME   labetalol (NORMODYNE) 100 MG tablet Take 1 tablet (100 mg total) by mouth 2 (two) times daily.   warfarin (COUMADIN) 2.5 MG tablet Take 5 mg by mouth 3 (three) times a week.     Allergies:   Amiodarone   Social History   Socioeconomic History   Marital status: Single    Spouse name: Not on file   Number of children: Not on file   Years of education: Not on file   Highest education level: Not on file  Occupational History   Not on file  Tobacco Use   Smoking status: Never    Passive exposure: Never   Smokeless tobacco: Never  Vaping Use   Vaping Use: Never used  Substance and Sexual Activity   Alcohol use: Never   Drug use: Never   Sexual activity: Not  on file  Other Topics Concern   Not on file  Social History Narrative   Not on file   Social Determinants of Health   Financial Resource Strain: Not on file  Food Insecurity: Not on file  Transportation Needs: Not on file  Physical Activity: Not on file  Stress: Not on file  Social Connections: Not on file     Family History: The patient's family history includes Cancer in his father; Heart disease in his mother; Hypertension in his brother and mother. ROS:   Please see the history of present illness.    All other systems reviewed and are negative.  EKGs/Labs/Other Studies Reviewed:    The following studies were reviewed today:  Cardiac Studies & Procedures   CARDIAC CATHETERIZATION  CARDIAC CATHETERIZATION 11/18/2018  Narrative  LV end diastolic pressure is normal.  There is mild aortic stenosis by gradient.  Mild, diffuse nonobstructive CAD.  Ao sat 99%, PA sat 78%, CO 6.9 L/min; CI 3.3, mean PA pressure 18 mm Hg; mean PCWP 11  mm Hg  Transient atrial fibrillation noted during the cath, which affected obtaining waveform and gradient during pullback from LV to Ao. Marland Kitchen  Plan for surgical evaluation of aneurysm and aortic valve.  Continue preventive therapy.  Findings Coronary Findings Diagnostic  Dominance: Right  Left Anterior Descending The vessel exhibits minimal luminal irregularities.  Second Diagonal Branch The vessel exhibits minimal luminal irregularities.  Left Circumflex The vessel exhibits minimal luminal irregularities.  Right Coronary Artery The vessel exhibits minimal luminal irregularities.  Intervention  No interventions have been documented.     ECHOCARDIOGRAM  ECHOCARDIOGRAM COMPLETE 08/31/2019  Narrative ECHOCARDIOGRAM REPORT    Patient Name:   ROCHESTER WINKELER Date of Exam: 08/31/2019 Medical Rec #:  [id number]          Height:       72.0 in Accession #:    [id number]         Weight:       179.0 lb Date of Birth:  June 11, 1960            BSA:          2.032 m Patient Age:    59 years           BP:           158/70 mmHg Patient Gender: M                  HR:           50 bpm. Exam Location:  [place]  Procedure: 2D Echo  Indications:    : S/P AVR (aortic valve replacement) [Z95.2 (ICD-10-CM)]  History:        Patient has prior history of Echocardiogram examinations, most recent 09/21/2018. CHF, S/P ascending aortic aneurysm repair, Ascending aorta enlargement, Bicuspid aortic valve, Arrythmias:Atrial Fibrillation; Signs/Symptoms:Hypertensive Heart Disease. Aortic Valve: 23 mm On X On X valve is present in the aortic position. Procedure Date: 12/17/2018.  Sonographer:    Louie Boston Referring Phys: [phone] Xayvier Vallez J Merissa Renwick  IMPRESSIONS   1. Left ventricular ejection fraction, by estimation, is 65 to 70%. The left ventricle has normal function. The left ventricle has no regional wall motion abnormalities. There is moderate concentric left ventricular hypertrophy. Left ventricular diastolic parameters are consistent with Grade III diastolic dysfunction (restrictive). 2. Right ventricular systolic function is normal. The right ventricular size is normal. There is mildly elevated pulmonary artery systolic pressure. 3. The mitral valve is normal in structure. Trivial mitral valve regurgitation. No evidence of mitral stenosis. 4. Replacement of ascending aorta with super coronary graft implantation (28 mm Hemashield platinum dacryon) proximally and a distal hemi-arch reconstruction under hypothermic circulatory arrest (17 minutes with 15 minutes of antegrade cerebral perfusion), aortic valve replacement (23 mm On-X mechanical valve), bilateral pulmonary vein isolation and left atrial appendage clipping. The aortic valve is normal in structure. Aortic valve regurgitation 2 trivial jets. No aortic stenosis is present. There is a 23 mm On X On X valve present in the aortic position. Procedure Date: 12/17/2018. Echo findings  are consistent with normal structure and function of the aortic valve prosthesis. 5. The inferior vena cava is normal in size with greater than 50% respiratory variability, suggesting right atrial pressure of 3 mmHg.  FINDINGS Left Ventricle: Left ventricular ejection fraction, by estimation, is 65 to 70%. The left ventricle has normal function. The left ventricle has no regional wall motion abnormalities. The left ventricular internal cavity size was normal in size. There is moderate concentric left ventricular hypertrophy.  Left ventricular diastolic parameters are consistent with Grade III diastolic dysfunction (restrictive).  Right Ventricle: The right ventricular size is normal. No increase in right ventricular wall thickness. Right ventricular systolic function is normal. There is mildly elevated pulmonary artery systolic pressure. The tricuspid regurgitant velocity is 2.80 m/s, and with an assumed right atrial pressure of 10 mmHg, the estimated right ventricular systolic pressure is 41.4 mmHg.  Left Atrium: Left atrial size was normal in size.  Right Atrium: Right atrial size was normal in size.  Pericardium: There is no evidence of pericardial effusion.  Mitral Valve: The mitral valve is normal in structure. Normal mobility of the mitral valve leaflets. Trivial mitral valve regurgitation. No evidence of mitral valve stenosis. MV peak gradient, 5.1 mmHg. The mean mitral valve gradient is 2.0 mmHg.  Tricuspid Valve: The tricuspid valve is normal in structure. Tricuspid valve regurgitation is trivial. No evidence of tricuspid stenosis.  Aortic Valve: Replacement of ascending aorta with super coronary graft implantation (28 mm Hemashield platinum dacryon) proximally and a distal hemi-arch reconstruction under hypothermic circulatory arrest (17 minutes with 15 minutes of antegrade cerebral perfusion), aortic valve replacement (23 mm On-X mechanical valve), bilateral pulmonary vein isolation  and left atrial appendage clipping. The aortic valve is normal in structure. Aortic valve regurgitation 2 trivial jets. No aortic stenosis is present. Aortic valve mean gradient measures 16.3 mmHg. Aortic valve peak gradient measures 32.7 mmHg. There is a 23 mm On X On X valve present in the aortic position. Procedure Date: 12/17/2018. Echo findings are consistent with normal structure and function of the aortic valve prosthesis.  Pulmonic Valve: The pulmonic valve was normal in structure. Pulmonic valve regurgitation is not visualized. No evidence of pulmonic stenosis.  Aorta: The aortic root is normal in size and structure.  Venous: The pulmonary veins were not well visualized. The inferior vena cava is normal in size with greater than 50% respiratory variability, suggesting right atrial pressure of 3 mmHg.  IAS/Shunts: No atrial level shunt detected by color flow Doppler.   LEFT VENTRICLE PLAX 2D LVIDd:         5.60 cm Diastology LVIDs:         2.90 cm LV e' lateral:   10.10 cm/s LV PW:         1.50 cm LV E/e' lateral: 9.3 LV IVS:        1.50 cm LV e' medial:    5.85 cm/s LV E/e' medial:  16.0   RIGHT VENTRICLE            IVC RV S prime:     9.60 cm/s  IVC diam: 1.70 cm TAPSE (M-mode): 1.9 cm  LEFT ATRIUM             Index       RIGHT ATRIUM           Index LA diam:        4.20 cm 2.07 cm/m  RA Area:     18.30 cm LA Vol (A2C):   77.0 ml 37.89 ml/m RA Volume:   50.10 ml  24.65 ml/m LA Vol (A4C):   68.0 ml 33.46 ml/m LA Biplane Vol: 75.2 ml 37.00 ml/m AORTIC VALVE AV Vmax:           286.00 cm/s AV Vmean:          182.667 cm/s AV VTI:            0.711 m AV Peak Grad:  32.7 mmHg AV Mean Grad:      16.3 mmHg LVOT Vmax:         151.00 cm/s LVOT Vmean:        108.000 cm/s LVOT VTI:          0.355 m LVOT/AV VTI ratio: 0.50  AORTA Ao Root diam: 3.80 cm Ao Asc diam:  3.50 cm  MITRAL VALVE               TRICUSPID VALVE MV Area (PHT): 2.62 cm    TR Peak grad:   31.4  mmHg MV Peak grad:  5.1 mmHg    TR Vmax:        280.00 cm/s MV Mean grad:  2.0 mmHg MV Vmax:       1.13 m/s    SHUNTS MV Vmean:      60.2 cm/s   Systemic VTI: 0.36 m MV Decel Time: 290 msec MV E velocity: 93.60 cm/s MV A velocity: 24.30 cm/s MV E/A ratio:  3.85  Norman Herrlich MD Electronically signed by Norman Herrlich MD Signature Date/Time: 08/31/2019/12:13:36 PM    Final   TEE  ECHO INTRAOPERATIVE TEE 12/16/2018  Narrative *INTRAOPERATIVE TRANSESOPHAGEAL REPORT *    Patient Name:   HANNA COMINS Date of Exam: 12/16/2018 Medical Rec #:  [id number]          Height:       72.0 in Accession #:    [id number]         Weight:       180.6 lb Date of Birth:  January 07, 1961           BSA:          2.04 m Patient Age:    58 years           BP:           142/63 mmHg Patient Gender: M                  HR:           51 bpm. Exam Location:  Anesthesiology  Transesophogeal exam was perform intraoperatively during surgical procedure. Patient was closely monitored under general anesthesia during the entirety of examination.  Indications:     Aortic valve stenosis, etiology of cardiac valve disease unspecified [I35.0]  Thoracic aortic aneurysm without rupture (HCC) [I71.2] History:         Abnormal ECG Risk Factors: Hypertension. Sonographer:     Leta Jungling RDCS Performing Phys: [id number] GLOVFIE Z ATKINS Diagnosing Phys: Lewie Loron MD  Complications: No known complications during this procedure. POST-OP IMPRESSIONS - Left Ventricle: The left ventricle is unchanged from pre-bypass. - Aorta: A graft was placed in the ascending aorta for repair. - Aortic Valve: No stenosis present. A bileaflet mechanical valve was placed, leaflets are freely mobile Size; 23mm. There is Trivial regurgitation. trivial. The gradient recorded across the prosthetic valve is within the expected range. No perivalvular leak noted. - Mitral Valve: The mitral valve appears unchanged from pre-bypass. -  Tricuspid Valve: The tricuspid valve appears unchanged from pre-bypass.  PRE-OP FINDINGS Left Ventricle: The left ventricle has normal systolic function, with an ejection fraction of 60-65%. The cavity size was normal. There is severe concentric left ventricular hypertrophy.  Right Ventricle: The right ventricle has normal systolic function. The cavity was normal. There is increased right ventricular wall thickness.  Left Atrium: Left atrial size was dilated.  Right Atrium: Right atrial size was normal in size. Right  atrial pressure is estimated at 10 mmHg.  Interatrial Septum: No atrial level shunt detected by color flow Doppler. There is right bowing of the interatrial septum, suggestive of elevated left atrial pressure.  Pericardium: There is no evidence of pericardial effusion.  Mitral Valve: The mitral valve is normal in structure. Mild thickening of the mitral valve leaflet. Mitral valve regurgitation is trivial by color flow Doppler.  Tricuspid Valve: The tricuspid valve was normal in structure. Tricuspid valve regurgitation is trivial by color flow Doppler.  Aortic Valve: The aortic valve is bicuspid Aortic valve regurgitation is moderate by color flow Doppler. There is moderate-severe stenosis of the aortic valve, with a calculated valve area of 1.73 cm.  Pulmonic Valve: The pulmonic valve was normal in structure. Pulmonic valve regurgitation is trivial by color flow Doppler.   Aorta: There is dilatation of the ascending aorta measuring 42 mm. There is evidence of an atheroma immobile plaque in the descending aorta; Grade II, measuring 2-72mm in size.  +--------------+--------++ LEFT VENTRICLE         +--------------+--------++ PLAX 2D                +--------------+--------++ LVOT diam:    2.50 cm  +--------------+--------++ LVOT Area:    4.91 cm +--------------+--------++                         +--------------+--------++  +------------------+------------++ AORTIC VALVE                   +------------------+------------++ AV Area (Vmax):   1.66 cm     +------------------+------------++ AV Area (Vmean):  1.67 cm     +------------------+------------++ AV Area (VTI):    1.73 cm     +------------------+------------++ AV Vmax:          346.00 cm/s  +------------------+------------++ AV Vmean:         237.000 cm/s +------------------+------------++ AV VTI:           0.897 m      +------------------+------------++ AV Peak Grad:     47.9 mmHg    +------------------+------------++ AV Mean Grad:     26.0 mmHg    +------------------+------------++ LVOT Vmax:        117.00 cm/s  +------------------+------------++ LVOT Vmean:       80.800 cm/s  +------------------+------------++ LVOT VTI:         0.316 m      +------------------+------------++ LVOT/AV VTI ratio:0.35         +------------------+------------++   +--------------+-------+ SHUNTS                +--------------+-------+ Systemic VTI: 0.32 m  +--------------+-------+ Systemic Diam:2.50 cm +--------------+-------+   Lewie Loron MD Electronically signed by Lewie Loron MD Signature Date/Time: 12/16/2018/2:49:59 PM    Final   MONITORS  LONG TERM MONITOR (3-14 DAYS) 05/26/2022  Narrative Patch Wear Time:  13 days and 23 hours  Predominant underlying rhythm was sinus rhythm 20 SVT episodes, longest and fastest 12.8 seconds at 145 bpm Less than 1% ventricular ectopy 5.9% supraventricular ectopy Triggered episodes associated with sinus rhythm with supraventricular ectopy  Will Camnitz, MD             Recent Labs: 02/18/2022: BUN 26; Creatinine, Ser 1.52; Potassium 4.7; Sodium 140 05/06/2022: Hemoglobin 15.0; Platelets 210.0 08/31/2022: TSH 3.410  Recent Lipid Panel    Component Value Date/Time   CHOL 121 06/19/2021 1427   TRIG  82 06/19/2021 1427   HDL 35 (L)  06/19/2021 1427   CHOLHDL 3.5 06/19/2021 1427   LDLCALC 70 06/19/2021 1427    Physical Exam:    VS:  BP (!) 148/76   Pulse (!) 58   Ht 6' (1.829 m)   Wt 180 lb (81.6 kg)   SpO2 98%   BMI 24.41 kg/m     Wt Readings from Last 3 Encounters:  09/29/22 180 lb (81.6 kg)  09/14/22 179 lb 12.8 oz (81.6 kg)  08/31/22 182 lb (82.6 kg)     GEN:  Well nourished, well developed in no acute distress HEENT: Normal NECK: No JVD; No carotid bruits LYMPHATICS: No lymphadenopathy CARDIAC: Normal exam mechanical AVR Sharp closing sound 1 of 6 flow murmur no AR RRR, no murmurs, rubs, gallops RESPIRATORY:  Clear to auscultation without rales, wheezing or rhonchi  ABDOMEN: Soft, non-tender, non-distended MUSCULOSKELETAL:  No edema; No deformity  SKIN: Warm and dry NEUROLOGIC:  Alert and oriented x 3 PSYCHIATRIC:  Normal affect    Signed, Norman Herrlich, MD  09/29/2022 10:52 AM    [facility]

## 2022-09-29 ENCOUNTER — Encounter: Payer: Self-pay | Admitting: Cardiology

## 2022-09-29 ENCOUNTER — Other Ambulatory Visit: Payer: Self-pay

## 2022-09-29 ENCOUNTER — Ambulatory Visit: Payer: Medicaid Other | Attending: Cardiology | Admitting: Cardiology

## 2022-09-29 VITALS — BP 148/76 | HR 58 | Ht 72.0 in | Wt 180.0 lb

## 2022-09-29 DIAGNOSIS — D6869 Other thrombophilia: Secondary | ICD-10-CM

## 2022-09-29 DIAGNOSIS — I10 Essential (primary) hypertension: Secondary | ICD-10-CM

## 2022-09-29 DIAGNOSIS — Z952 Presence of prosthetic heart valve: Secondary | ICD-10-CM | POA: Diagnosis not present

## 2022-09-29 DIAGNOSIS — I480 Paroxysmal atrial fibrillation: Secondary | ICD-10-CM | POA: Diagnosis not present

## 2022-09-29 MED ORDER — WARFARIN SODIUM 2.5 MG PO TABS: 5.0000 mg | ORAL_TABLET | ORAL | 1 refills | Status: DC

## 2022-09-29 NOTE — Patient Instructions (Signed)
Medication Instructions:  Your physician recommends that you continue on your current medications as directed. Please refer to the Current Medication list given to you today.  *If you need a refill on your cardiac medications before your next appointment, please call your pharmacy*   Lab Work: None If you have labs (blood work) drawn today and your tests are completely normal, you will receive your results only by: MyChart Message (if you have MyChart) OR A paper copy in the mail If you have any lab test that is abnormal or we need to change your treatment, we will call you to review the results.   Testing/Procedures: None   Follow-Up: At [facility], you and your health needs are our priority.  As part of our continuing mission to provide you with exceptional heart care, we have created designated Provider Care Teams.  These Care Teams include your primary Cardiologist (physician) and Advanced Practice Providers (APPs -  Physician Assistants and Nurse Practitioners) who all work together to provide you with the care you need, when you need it.  We recommend signing up for the patient portal called "MyChart".  Sign up information is provided on this After Visit Summary.  MyChart is used to connect with patients for Virtual Visits (Telemedicine).  Patients are able to view lab/test results, encounter notes, upcoming appointments, etc.  Non-urgent messages can be sent to your provider as well.   To learn more about what you can do with MyChart, go to [email or web address].    Your next appointment:   6 month(s)  Provider:   Brian Munley, MD    Other Instructions None  

## 2022-10-10 ENCOUNTER — Encounter: Payer: Self-pay | Admitting: Internal Medicine

## 2022-10-12 ENCOUNTER — Other Ambulatory Visit: Payer: Self-pay | Admitting: Internal Medicine

## 2022-10-13 ENCOUNTER — Telehealth: Payer: Self-pay

## 2022-10-13 LAB — PROTIME-INR
INR: 2.4 — ABNORMAL HIGH (ref 0.9–1.2)
Prothrombin Time: 24.7 s — ABNORMAL HIGH (ref 9.1–12.0)

## 2022-10-13 NOTE — Telephone Encounter (Signed)
   Pre-operative Risk Assessment    Patient Name: Joe Klein  DOB: [birth year] MRN: [id number]      Request for Surgical Clearance    Procedure:   TRUS Prostate Biopsy  Date of Surgery:  Clearance TBD                                 Surgeon:  Dr Debroah Baller, MD  Surgeon's Group or Practice Name:  [facility] Phone number:  [phone] Fax number:  [phone]   Type of Clearance Requested:   - Pharmacy:  Hold Aspirin Aspirin and Warfarin   Type of Anesthesia:  Not Indicated   Additional requests/questions:  Please advise surgeon/provider what medications should be held.  Signed, Viviano Simas   10/13/2022, 9:13 AM

## 2022-10-14 ENCOUNTER — Other Ambulatory Visit: Payer: Self-pay

## 2022-10-14 MED ORDER — WARFARIN SODIUM 2.5 MG PO TABS: ORAL_TABLET | ORAL | 3 refills | Status: DC

## 2022-10-14 NOTE — Telephone Encounter (Signed)
Patient on warfarin for On-X mechanical AVR and afib. Primary care managing INRs.  Procedure: TRUS prostate biopsy Date of procedure: TBD  CHA2DS2-VASc Score = 2  This indicates a 2.2% annual risk of stroke. The patient's score is based upon: CHF History: 1 HTN History: 1 Diabetes History: 0 Stroke History: 0 Vascular Disease History: 0 Age Score: 0 Gender Score: 0   CrCl 61mL/min Platelet count 210K  Per office protocol, patient can hold warfarin for 5 days prior to procedure. Typically with history of mechanical AVR + afib, would bridge with Lovenox, however since pt has On-X valve which carries lower thrombotic risk, don't believe he should need bridging but will confirm with MD.  **This guidance is not considered finalized until pre-operative APP has relayed final recommendations.**

## 2022-10-15 NOTE — Telephone Encounter (Signed)
Placed call to pt.  He has been made aware that his clearance has been sent back over to Urology.  He is aware that he will need to bridged with Lovenox when Warfarin is being held, but that would need to come from his PCP since they manage his INR.  Pt verbalized understanding and thanked me for the call.

## 2022-10-15 NOTE — Telephone Encounter (Signed)
   Primary Cardiologist: Norman Herrlich, MD  Chart reviewed as part of pre-operative protocol coverage. Given past medical history and time since last visit, based on ACC/AHA guidelines, Joe Klein would be at acceptable risk for the planned procedure without further cardiovascular testing.   Patient was advised that if he develops new symptoms prior to surgery to contact our office to arrange a follow-up appointment.  He verbalized understanding.  Per Dr. Dulce Sellar, he will need to be bridged with Lovenox while warfarin is being held for the procedure. Bridging directions will need to come from patient's PCP who manages his INR.   I will route this recommendation to the requesting party via Epic fax function and remove from pre-op pool.  Please call with questions.  Levi Aland, NP-C  10/15/2022, 8:53 AM [address], [address] Office [phone] Fax [phone]

## 2022-10-17 ENCOUNTER — Other Ambulatory Visit: Payer: Self-pay | Admitting: Cardiology

## 2022-11-13 ENCOUNTER — Other Ambulatory Visit: Payer: Self-pay | Admitting: Internal Medicine

## 2022-11-14 LAB — PROTIME-INR
INR: 2.2 — ABNORMAL HIGH (ref 0.9–1.2)
Prothrombin Time: 22.8 s — ABNORMAL HIGH (ref 9.1–12.0)

## 2022-12-02 ENCOUNTER — Encounter: Payer: Self-pay | Admitting: Internal Medicine

## 2022-12-02 ENCOUNTER — Ambulatory Visit: Payer: Medicaid Other | Admitting: Internal Medicine

## 2022-12-02 VITALS — BP 142/86 | HR 95 | Temp 98.4°F | Resp 17 | Ht 72.0 in | Wt 185.0 lb

## 2022-12-02 DIAGNOSIS — I10 Essential (primary) hypertension: Secondary | ICD-10-CM

## 2022-12-02 DIAGNOSIS — Z7901 Long term (current) use of anticoagulants: Secondary | ICD-10-CM

## 2022-12-02 DIAGNOSIS — I480 Paroxysmal atrial fibrillation: Secondary | ICD-10-CM | POA: Diagnosis not present

## 2022-12-02 DIAGNOSIS — E0590 Thyrotoxicosis, unspecified without thyrotoxic crisis or storm: Secondary | ICD-10-CM | POA: Diagnosis not present

## 2022-12-02 MED ORDER — HYDRALAZINE HCL 100 MG PO TABS: 100.0000 mg | ORAL_TABLET | Freq: Two times a day (BID) | ORAL | 3 refills | Status: DC

## 2022-12-02 NOTE — Assessment & Plan Note (Signed)
He is no longer on methimazole and his TFT's were normal 3 months ago.  I am going to repeat this again today.

## 2022-12-02 NOTE — Assessment & Plan Note (Signed)
His BP is a bit elevated and was elevated all last year and he was elevated when he saw Dr. Dulce Sellar in 09/2022.  I am going to increase his hydralazine from 75mg  BID to 100mg  BID.

## 2022-12-02 NOTE — Assessment & Plan Note (Signed)
He is currently rate controlled.  His last INR was therapeutic and his previous INR's were therapeutic.  Continue on his current meds.

## 2022-12-02 NOTE — Progress Notes (Signed)
Office Visit  Subjective   Patient ID: Joe Klein   DOB: [birth year]   Age: 62 y.o.   MRN: [id number]   Chief Complaint Chief Complaint  Patient presents with   Follow-up     History of Present Illness This is a 62 year old Caucasian/White male who has paroxysmal atrial fibrillation which was diagnosed in 12/2018 when he was hospitalized for his CHF and aortic stenosis. He states he was initially told he had A. Fib when he went for a heart catherization prior to his aortic valve replacement. His atrial fibrillation is controlled with therapy as summarized in the medication list and previous notes. Specifically denied complaints: chest pain, TIAs, orthopnea, edema, exertional dyspnea, and syncope. Anticoagulation status: therapeutic anticoagulation-see lab section. The patient was placed on a beta blocker as well as amiodarone. He underwent a Maze procedure and clipping of the atrial appendage on 12/16/2018. He is followed by cardiology for his coumadin levels.   He did see Dr. Elberta Fortis in electrophysiology in 04/2022 where he noted he was on labetalol 100 mg twice daily and warfarin 2.5 mg Tue/Thur/Sat/Sun and 5mg  on M/W/F.  His last INR was done on 11/13/2022 and was 2.2.  He felt his CHA2DS2-VASc score was a 2.  He reviewed his cardia mobile strips and it was unclear to him whether or not he is having episodes of atrial fibrillation.  He had PACs on his ECG which further complicated this.  The patient had a zio patch placed and on review of these results he had a predominant underlying sinus rhythm with some runs of SVT.  Mr. 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   I did see him 3 months ago where we repeated TFT's which normal.  He is off the methimazole at this time.  I saw him late 01/2022 where he presented with complaints of weight loss, SOB and body trembling.  He saw Dr. Dulce Sellar in 06/2021 where his weight was 186 lbs. His  weight in 01/2022 went down to 165 lbs.   The patient states he began having SOB where prior to that he can go up and down steps including at the beach where a few weeks ago he could go up 3 flights of stairs without SOB. He stated he had lethargy, fatigue and trembling.  He first noticed his whole body trembling about a month prior.  I did lab testing and discovered that his TSH was low and felt he had hyperthyroidism due to his amiodarone.  I did contact his cardiologist and I started him on methiazole 10mg  daily and obtained a thyroid US.  This thyroid US was done on 02/12/2022 and this showed a thyroid of normal size and no hypervascularity.   Today, his energy and fatigue and SOB are all improved and he has started back to hiking.  Also over the interim, the hoarseness of his voice resolved and he never went to see ENT.  The patient is a 62 year old male who presents for a follow-up evaluation of hypertension.  On his last visit, I was concerend about his BP being elevated.  The patient has not been checking his blood pressure at home.  The patient's current medications include: hydralazine 75mg  BID and labetalol 100mg  BID.  He has lasix prn only for leg swelling but he has not had to take this medication in months.  He was previously on amlodipine 10mg  daily but he stopped  this due to foot edema. The patient denies any headache, visual changes, lightheadness, shortness of breath, and weakness/numbness. He reports there have been no other symptoms noted.        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   Atrial fibrillation (HCC) 12/26/2018   Bicuspid aortic valve 10/25/2018   CHF (congestive heart failure) (HCC)    Chronic combined systolic and diastolic heart failure (HCC) 10/25/2018   Chronic kidney disease    stage 3   CKD (chronic kidney  disease) stage 3, GFR 30-59 ml/min (HCC) 10/25/2018   Dysrhythmia    A-fib   Encounter for therapeutic drug monitoring 12/26/2018   Essential hypertension 06/03/2022   Hoarseness 06/03/2022   Hypertension    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 and stage 3 chronic kidney disease (HCC) 10/25/2018   Hyperthyroidism 06/03/2022   Insomnia 09/22/2022   Long term (current) use of anticoagulants 12/26/2018   S/P ascending aortic aneurysm repair 12/16/2018   Thoracic aortic aneurysm without rupture (HCC)      Allergies Allergies  Allergen Reactions   Amiodarone Other (See Comments)    Hyperthyroidism     Medications  Current Outpatient Medications:    aspirin 81 MG EC tablet, Take 81 mg by mouth daily. , Disp: , Rfl:    atorvastatin (LIPITOR) 40 MG tablet, Take 1 tablet (40 mg total) by mouth daily., Disp: 90 tablet, Rfl: 2   empagliflozin (JARDIANCE) 10 MG TABS tablet, Take 1 tablet (10 mg total) by mouth every morning., Disp: 90 tablet, Rfl: 0   furosemide (LASIX) 20 MG tablet, Take 20 mg by mouth daily as needed (as directed)., Disp: , Rfl:    labetalol (NORMODYNE) 100 MG tablet, Take 1 tablet (100 mg total) by mouth 2 (two) times daily., Disp: 180 tablet, Rfl: 1   warfarin (COUMADIN) 2.5 MG tablet, Take two tablets by mouth on M,W,F. Take one tablet by mouth on Tues, Thurs, Sat and Sun, Disp: 90 tablet, Rfl: 3   Review of Systems Review of Systems  Constitutional:  Negative for fever.  Eyes:  Negative for blurred vision and double vision.  Respiratory:  Negative for cough, hemoptysis and shortness of breath.   Cardiovascular:  Negative for chest pain, palpitations and leg swelling.  Gastrointestinal:  Negative for abdominal pain, blood in stool, constipation, diarrhea, melena, nausea and vomiting.  Genitourinary:  Negative for frequency and hematuria.  Musculoskeletal:  Negative for myalgias.  Skin:  Negative for itching and rash.   Neurological:  Negative for dizziness, weakness and headaches.  Endo/Heme/Allergies:  Negative for polydipsia.       Objective:    Vitals BP (!) 142/86   Pulse 95   Temp 98.4 F (36.9 C)   Resp 17   Ht 6' (1.829 m)   Wt 185 lb (83.9 kg)   SpO2 97%   BMI 25.09 kg/m    Physical Examination Physical Exam Constitutional:      Appearance: Normal appearance. He is not ill-appearing.  Neck:     Vascular: No carotid bruit.  Cardiovascular:     Rate and Rhythm: Normal rate and regular rhythm.     Pulses: Normal pulses.     Heart sounds: No murmur heard.    No friction rub. No gallop.  Pulmonary:     Effort: Pulmonary effort is  normal. No respiratory distress.     Breath sounds: No wheezing, rhonchi or rales.  Abdominal:     General: Abdomen is flat. Bowel sounds are normal. There is no distension.     Palpations: Abdomen is soft.     Tenderness: There is no abdominal tenderness.  Musculoskeletal:     Right lower leg: No edema.     Left lower leg: No edema.  Skin:    General: Skin is warm and dry.     Findings: No rash.  Neurological:     General: No focal deficit present.     Mental Status: He is alert and oriented to person, place, and time.  Psychiatric:        Mood and Affect: Mood normal.        Behavior: Behavior normal.        Assessment & Plan:   Essential hypertension His BP is a bit elevated and was elevated all last year and he was elevated when he saw Dr. Dulce Sellar in 09/2022.  I am going to increase his hydralazine from 75mg  BID to 100mg  BID.  Atrial fibrillation (HCC) He is currently rate controlled.  His last INR was therapeutic and his previous INR's were therapeutic.  Continue on his current meds.  Hyperthyroidism He is no longer on methimazole and his TFT's were normal 3 months ago.  I am going to repeat this again today.    Return in about 3 months (around 03/05/2023) for annual.   Crist Fat, MD

## 2022-12-04 NOTE — Progress Notes (Signed)
Tell him that his thyroid function is normal.  Pt letter mailed

## 2022-12-08 NOTE — Progress Notes (Signed)
His INR from 2 weeks ago looked good. How often does he check his INR?  Patient notified.

## 2022-12-09 ENCOUNTER — Other Ambulatory Visit: Payer: Self-pay | Admitting: Internal Medicine

## 2022-12-14 ENCOUNTER — Other Ambulatory Visit: Payer: Self-pay | Admitting: Internal Medicine

## 2022-12-24 NOTE — Progress Notes (Signed)
His inr looks good. He needs a repeat INR in 1 month if it is not already scheduled.  Pt. Has been informed of results and mentioned that his Cardiologist used to allow him to go every 6 weeks for repeat INR once he got to a maintenance dose. He is questioning, can he start doing that now instead of every 4 weeks?

## 2023-01-14 ENCOUNTER — Other Ambulatory Visit: Payer: Self-pay | Admitting: Internal Medicine

## 2023-01-15 LAB — PROTIME-INR
INR: 2.6 — ABNORMAL HIGH (ref 0.9–1.2)
Prothrombin Time: 27.3 s — ABNORMAL HIGH (ref 9.1–12.0)

## 2023-01-28 ENCOUNTER — Other Ambulatory Visit: Payer: Self-pay | Admitting: Cardiology

## 2023-02-08 NOTE — Progress Notes (Signed)
His INR looks good. Check it in 6 weeks.   Patient aware of labs per [facility]

## 2023-02-22 ENCOUNTER — Other Ambulatory Visit: Payer: Self-pay | Admitting: Cardiology

## 2023-02-22 DIAGNOSIS — I10 Essential (primary) hypertension: Secondary | ICD-10-CM

## 2023-02-22 DIAGNOSIS — Z952 Presence of prosthetic heart valve: Secondary | ICD-10-CM

## 2023-02-22 NOTE — Telephone Encounter (Signed)
Rx refill sent to pharmacy. 

## 2023-02-26 ENCOUNTER — Other Ambulatory Visit: Payer: Self-pay | Admitting: Internal Medicine

## 2023-02-27 LAB — PROTIME-INR
INR: 2 — ABNORMAL HIGH (ref 0.9–1.2)
Prothrombin Time: 21 s — ABNORMAL HIGH (ref 9.1–12.0)

## 2023-03-11 ENCOUNTER — Ambulatory Visit: Payer: Medicaid Other | Admitting: Internal Medicine

## 2023-03-11 ENCOUNTER — Encounter: Payer: Self-pay | Admitting: Internal Medicine

## 2023-03-11 VITALS — BP 120/72 | HR 74 | Temp 98.4°F | Resp 17 | Ht 72.0 in | Wt 188.0 lb

## 2023-03-11 DIAGNOSIS — E0590 Thyrotoxicosis, unspecified without thyrotoxic crisis or storm: Secondary | ICD-10-CM

## 2023-03-11 DIAGNOSIS — Z125 Encounter for screening for malignant neoplasm of prostate: Secondary | ICD-10-CM

## 2023-03-11 DIAGNOSIS — N183 Chronic kidney disease, stage 3 unspecified: Secondary | ICD-10-CM

## 2023-03-11 DIAGNOSIS — Z131 Encounter for screening for diabetes mellitus: Secondary | ICD-10-CM

## 2023-03-11 DIAGNOSIS — Z5181 Encounter for therapeutic drug level monitoring: Secondary | ICD-10-CM

## 2023-03-11 DIAGNOSIS — Z13220 Encounter for screening for lipoid disorders: Secondary | ICD-10-CM

## 2023-03-11 NOTE — Progress Notes (Unsigned)
Office Visit  Subjective   Patient ID: Joe Klein   DOB: Nov 18, 1960   Age: 62 y.o.   MRN: [id number]   Chief Complaint Chief Complaint  Patient presents with  . Follow-up     History of Present Illness  Has a cologuard sitting at home.   This is a 62 year old Caucasian/White male who has paroxysmal atrial fibrillation which was diagnosed in 12/2018 when he was hospitalized for his CHF and aortic stenosis. He states he was initially told he had A. Fib when he went for a heart catherization prior to his aortic valve replacement. His atrial fibrillation is controlled with therapy as summarized in the medication list and previous notes. Specifically denied complaints: chest pain, TIAs, orthopnea, edema, exertional dyspnea, and syncope. Anticoagulation status: therapeutic anticoagulation-see lab section. The patient was placed on a beta blocker as well as amiodarone. He underwent a Maze procedure and clipping of the atrial appendage on 12/16/2018. I am currently following his coumadin levels.   He last saw Dr. Elberta Fortis in electrophysiology in 09/2022 where he felt his Parx. A Fib was controlled.  He had cardiac monitor placed in 05/26/2202 and predominant underlying rhythm was sinus rhythm with 20 SVT episodes, longest and fastest 12.8 seconds at 145 bpm, less than 1% ventricular ectopy, 5.9% supraventricular ectopy.    He remains on warfarin 2.5 mg Tue/Thur/Sat/Sun and 5mg  on M/W/F.  He felt his CHA2DS2-VASc score was a 2.  His last ECHO was done on 08/31/2019 which showed a left ventricular ejection fraction, by estimation, is 65 to 70%. The left ventricle had normal function. The left ventricle has no regional wall motion abnormalities. There was moderate concentric left ventricular hypertrophy. Left ventricular diastolic parameters are consistent with Grade III diastolic dysfunction (restrictive).  His right ventricular systolic function was normal.  Replacement of ascending aorta with super  coronary graft implantation (28 mm Hemashield platinum dacryon) proximally and a distal hemi-arch reconstruction under hypothermic circulatory arrest (17 minutes with 15 minutes of antegrade cerebral perfusion), aortic valve replacement (23 mm On-X mechanical valve), bilateral pulmonary vein isolation and left atrial appendage clipping. The aortic valve is normal in structure. Aortic valve regurgitation 2 trivial jets. No aortic stenosis is present. There was a 23 mm On X On X valve present in the aortic position. Echo findings are consistent with normal structure and function of the aortic valve prosthesis.    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   I did see him in 11/2022 where we repeated TFT's which normal.  He is off the methimazole at this time.  I saw him late 01/2022 where he presented with complaints of weight loss, SOB and body trembling.  He saw Dr. Dulce Sellar in 06/2021 where his weight was 186 lbs. His weight in 01/2022 went down to 165 lbs.   The patient states he began having SOB where prior to that he can go up and down steps including at the beach where a few weeks ago he could go up 3 flights of stairs without SOB. He stated he had lethargy, fatigue and trembling.  He first noticed his whole body trembling about a month prior.  I did lab testing and discovered that his TSH was low and felt he had hyperthyroidism due to his amiodarone.  I did contact his cardiologist and I started him on methiazole 10mg  daily and obtained a thyroid US.  This thyroid US was done  on 02/12/2022 and this showed a thyroid of normal size and no hypervascularity.   Today, his energy and fatigue and SOB are all improved and he has started back to hiking.  Also over the interim, the hoarseness of his voice resolved and he never went to see ENT.   The patient is a 62 year old male who presents for a follow-up evaluation of hypertension.  On his last visit, his  BP was elevated and I increased his hydralazine from 75mg  BID to 100mg  BID.  The patient has not been checking his blood pressure at home.  The patient's current medications include: hydralazine 100mg  BID and labetalol 100mg  BID.  He has lasix prn only for leg swelling but he has not had to take this medication in months.  He was previously on amlodipine 10mg  daily but he stopped this due to foot edema. The patient denies any headache, visual changes, lightheadness, shortness of breath, and weakness/numbness. He reports there have been no other symptoms noted.   The patient has Stage IIIa CKD which they noted while he was hospitalized in 12/2018.  Again, he had acute renal failure with a creatinine of 1.6 but they wondered if this was longstanding CKD.  He denies any NSAID use.  I believe his Stage III CKD is secondary to longstanding hypertension.  I did start him on Jardiance 10mg  daily in 02/2022 for his CKD and history of CHF.    Past Medical History Past Medical History:  Diagnosis Date  . AAA (abdominal aortic aneurysm) (HCC)   . Anxiety 09/22/2022  . Aortic stenosis   . 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 Atrial fibrillation (HCC) 12/26/2018  . Bicuspid aortic valve 10/25/2018  . CHF (congestive heart failure) (HCC)   . Chronic combined systolic and diastolic heart failure (HCC) 10/25/2018  . Chronic kidney disease    stage 3  . CKD (chronic kidney disease) stage 3, GFR 30-59 ml/min (HCC) 10/25/2018  . Dysrhythmia    A-fib  . Encounter for therapeutic drug monitoring 12/26/2018  . Essential hypertension 06/03/2022  . Hoarseness 06/03/2022  . Hypertension   . 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 and stage 3 chronic kidney disease (HCC) 10/25/2018  . Hyperthyroidism 06/03/2022  . Insomnia 09/22/2022  . Long term (current) use of anticoagulants 12/26/2018  . S/P ascending aortic aneurysm repair 12/16/2018  . Thoracic aortic  aneurysm without rupture (HCC)      Allergies Allergies  Allergen Reactions  . Amiodarone Other (See Comments)    Hyperthyroidism     Medications  Current Outpatient Medications:  .  aspirin 81 MG EC tablet, Take 81 mg by mouth daily. , Disp: , Rfl:  .  atorvastatin (LIPITOR) 40 MG tablet, Take 1 tablet (40 mg total) by mouth daily., Disp: 90 tablet, Rfl: 2 .  furosemide (LASIX) 20 MG tablet, Take 20 mg by mouth daily as needed (as directed)., Disp: , Rfl:  .  hydrALAZINE (APRESOLINE) 100 MG tablet, Take 1 tablet (100 mg total) by mouth 2 (two) times daily., Disp: 180 tablet, Rfl: 3 .  JARDIANCE 10 MG TABS tablet, TAKE 1 TABLET BY MOUTH ONCE DAILY IN THE MORNING, Disp: 90 tablet, Rfl: 0 .  labetalol (NORMODYNE) 100 MG tablet, Take 1 tablet by mouth twice daily, Disp: 180 tablet, Rfl: 1 .  warfarin (COUMADIN) 2.5 MG tablet, Take two tablets by mouth on M,W,F. Take one tablet by mouth on Tues, Thurs, Sat and Sun, Disp: 90 tablet,  Rfl: 3   Review of Systems Review of Systems  Constitutional:  Negative for chills and fever.  Eyes:  Negative for blurred vision and double vision.  Respiratory:  Negative for cough, hemoptysis, shortness of breath and wheezing.   Cardiovascular:  Negative for chest pain, palpitations and leg swelling.  Gastrointestinal:  Negative for abdominal pain, blood in stool, constipation, diarrhea, heartburn, melena, nausea and vomiting.  Genitourinary:  Negative for frequency and hematuria.  Musculoskeletal:  Negative for myalgias.  Skin:  Negative for itching and rash.  Neurological:  Negative for dizziness, weakness and headaches.  Endo/Heme/Allergies:  Negative for polydipsia.       Objective:    Vitals BP 120/72   Pulse 74   Temp 98.4 F (36.9 C)   Resp 17   Ht 6' (1.829 m)   Wt 188 lb (85.3 kg)   SpO2 96%   BMI 25.50 kg/m    Physical Examination Physical Exam     Assessment & Plan:   No problem-specific Assessment & Plan notes found for  this encounter.    No follow-ups on file.   Crist Fat, MD

## 2023-03-12 ENCOUNTER — Other Ambulatory Visit: Payer: Self-pay | Admitting: Internal Medicine

## 2023-03-12 LAB — CMP14 + ANION GAP
ALT: 19 [IU]/L (ref 0–44)
AST: 21 [IU]/L (ref 0–40)
Albumin: 3.9 g/dL (ref 3.9–4.9)
Alkaline Phosphatase: 52 [IU]/L (ref 44–121)
Anion Gap: 14 mmol/L (ref 10.0–18.0)
BUN/Creatinine Ratio: 15 (ref 10–24)
BUN: 28 mg/dL — ABNORMAL HIGH (ref 8–27)
Bilirubin Total: 0.5 mg/dL (ref 0.0–1.2)
CO2: 21 mmol/L (ref 20–29)
Calcium: 9.5 mg/dL (ref 8.6–10.2)
Chloride: 104 mmol/L (ref 96–106)
Creatinine, Ser: 1.86 mg/dL — ABNORMAL HIGH (ref 0.76–1.27)
Globulin, Total: 3.9 g/dL (ref 1.5–4.5)
Glucose: 84 mg/dL (ref 70–99)
Potassium: 4.5 mmol/L (ref 3.5–5.2)
Sodium: 139 mmol/L (ref 134–144)
Total Protein: 7.8 g/dL (ref 6.0–8.5)
eGFR: 40 mL/min/{1.73_m2} — ABNORMAL LOW (ref >59)

## 2023-03-12 LAB — CBC WITH DIFFERENTIAL/PLATELET
Basophils Absolute: 0 10*3/uL (ref 0.0–0.2)
Basos: 0 %
EOS (ABSOLUTE): 0.2 10*3/uL (ref 0.0–0.4)
Eos: 2 %
Hematocrit: 45.6 % (ref 37.5–51.0)
Hemoglobin: 14.8 g/dL (ref 13.0–17.7)
Immature Grans (Abs): 0 10*3/uL (ref 0.0–0.1)
Immature Granulocytes: 0 %
Lymphocytes Absolute: 1.3 10*3/uL (ref 0.7–3.1)
Lymphs: 17 %
MCH: 28.8 pg (ref 26.6–33.0)
MCHC: 32.5 g/dL (ref 31.5–35.7)
MCV: 89 fL (ref 79–97)
Monocytes Absolute: 0.8 10*3/uL (ref 0.1–0.9)
Monocytes: 10 %
Neutrophils Absolute: 5.4 10*3/uL (ref 1.4–7.0)
Neutrophils: 71 %
Platelets: 209 10*3/uL (ref 150–450)
RBC: 5.14 x10E6/uL (ref 4.14–5.80)
RDW: 12.9 % (ref 11.6–15.4)
WBC: 7.6 10*3/uL (ref 3.4–10.8)

## 2023-03-12 LAB — MICROALBUMIN / CREATININE URINE RATIO
Creatinine, Urine: 73.9 mg/dL
Microalb/Creat Ratio: 133 mg/g{creat} — ABNORMAL HIGH (ref 0–29)
Microalbumin, Urine: 98.6 ug/mL

## 2023-03-12 LAB — LIPID PANEL
Chol/HDL Ratio: 3.6 ratio (ref 0.0–5.0)
Cholesterol, Total: 113 mg/dL (ref 100–199)
HDL: 31 mg/dL — ABNORMAL LOW (ref >39)
LDL Chol Calc (NIH): 63 mg/dL (ref 0–99)
Triglycerides: 100 mg/dL (ref 0–149)
VLDL Cholesterol Cal: 19 mg/dL (ref 5–40)

## 2023-03-12 LAB — PSA: Prostate Specific Ag, Serum: 6 ng/mL — ABNORMAL HIGH (ref 0.0–4.0)

## 2023-03-12 LAB — HEMOGLOBIN A1C
Est. average glucose Bld gHb Est-mCnc: 108 mg/dL
Hgb A1c MFr Bld: 5.4 % (ref 4.8–5.6)

## 2023-03-12 LAB — TSH: TSH: 2.75 u[IU]/mL (ref 0.450–4.500)

## 2023-03-12 LAB — T4, FREE: Free T4: 1.36 ng/dL (ref 0.82–1.77)

## 2023-03-12 LAB — MAGNESIUM: Magnesium: 2.1 mg/dL (ref 1.6–2.3)

## 2023-03-20 ENCOUNTER — Other Ambulatory Visit: Payer: Self-pay | Admitting: Internal Medicine

## 2023-04-08 ENCOUNTER — Other Ambulatory Visit: Payer: Self-pay | Admitting: Internal Medicine

## 2023-04-12 LAB — PROTIME-INR
INR: 2.5 — ABNORMAL HIGH (ref 0.9–1.2)
Prothrombin Time: 26.6 s — ABNORMAL HIGH (ref 9.1–12.0)

## 2023-04-20 ENCOUNTER — Other Ambulatory Visit: Payer: Self-pay | Admitting: Urology

## 2023-04-20 DIAGNOSIS — R972 Elevated prostate specific antigen [PSA]: Secondary | ICD-10-CM

## 2023-05-17 ENCOUNTER — Other Ambulatory Visit: Payer: Self-pay | Admitting: Internal Medicine

## 2023-05-18 LAB — PROTIME-INR
INR: 2.4 — ABNORMAL HIGH (ref 0.9–1.2)
Prothrombin Time: 25.1 s — ABNORMAL HIGH (ref 9.1–12.0)

## 2023-06-07 NOTE — Progress Notes (Signed)
 Cardiology Office Note:    Date:  06/08/2023   ID:  Joe Klein, DOB Sep 26, 1960, MRN [id number]  PCP:  Fleeta Valeria Mayo, MD  Cardiologist:  Redell Leiter, MD    Referring MD: Fleeta Valeria, Mayo, MD    ASSESSMENT:    1. S/P AVR   2. Chronic anticoagulation   3. 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 Paroxysmal atrial fibrillation (HCC)   5. Hyperthyroidism    PLAN:    In order of problems listed above:  [name] continues to do well following AVR compliant with warfarin well managed by his PCP INR target for On-X AVR Blood pressure is well-controlled on current medications including labetalol  hydralazine  furosemide  along with Jardiance . Kidney function is improved Off amiodarone  no recurrence Normal thyroid  after withdrawal amiodarone  and treatment for amiodarone  induced hyperthyroidism   Next appointment: 9 months   Medication Adjustments/Labs and Tests Ordered: Current medicines are reviewed at length with the patient today.  Concerns regarding medicines are outlined above.  No orders of the defined types were placed in this encounter.  No orders of the defined types were placed in this encounter.    History of Present Illness:    Joe Klein is a 63 y.o. 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  Last seen 09/29/2022. He underwent aortic valve replacement with a #23 On-X mechanical valve and repair thoracic ascending aorta with a Hema shield Dacron proximal and distal Hemi arch reconstruction Maze procedure and clipping at the atrial appendage 12/16/2018.  With  discontinuation of amiodarone  and suppressant therapy his thyroid  is normal months.  Compliance with diet, lifestyle and medications: Yes  He continues to do well after valve surgery and correction of amiodarone  induced hypothyroidism Regained his weight muscle very active and no exercise intolerance edema shortness of breath chest pain palpitation or syncope Warfarin managed with his PCP INR is at target for his On-X AVR He is on lipid-lowering therapy well-tolerated no muscle pain or weakness ideal LDL Kidney function is improved Past Medical History:  Diagnosis Date   AAA (abdominal aortic aneurysm) (HCC)    Anxiety 09/22/2022   Aortic stenosis    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   Atrial fibrillation (HCC) 12/26/2018   Bicuspid aortic valve 10/25/2018   CHF (congestive heart failure) (HCC)    Chronic combined systolic and diastolic heart failure (HCC) 10/25/2018   Chronic kidney disease    stage 3   CKD (chronic kidney disease) stage 3, GFR 30-59 ml/min (HCC) 10/25/2018   Dysrhythmia    A-fib   Encounter for therapeutic drug monitoring 12/26/2018   Essential hypertension 06/03/2022   Hoarseness 06/03/2022   Hypertension    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 and stage 3 chronic kidney disease (HCC) 10/25/2018   Hyperthyroidism 06/03/2022   Insomnia 09/22/2022   Long term (current) use of anticoagulants 12/26/2018   S/P ascending aortic aneurysm repair 12/16/2018   Thoracic aortic aneurysm without rupture (HCC)     Current Medications: Current Meds  Medication Sig   aspirin  81 MG EC tablet Take 81 mg by mouth daily.    atorvastatin  (LIPITOR) 40 MG  tablet Take 1 tablet (40 mg total) by mouth daily.   furosemide  (LASIX ) 20 MG tablet Take 20 mg by mouth daily as needed (as directed).   hydrALAZINE  (APRESOLINE ) 100 MG tablet Take 1 tablet (100 mg total) by mouth 2 (two) times daily.   JARDIANCE  10 MG TABS tablet TAKE 1  TABLET BY MOUTH ONCE DAILY IN THE MORNING   labetalol  (NORMODYNE ) 100 MG tablet Take 1 tablet by mouth twice daily   warfarin (COUMADIN ) 2.5 MG tablet Take two tablets by mouth on M,W,F. Take one tablet by mouth on Tues, Thurs, Sat and Sun      EKGs/Labs/Other Studies Reviewed:    The following studies were reviewed today:  Cardiac Studies & Procedures   CARDIAC CATHETERIZATION  CARDIAC CATHETERIZATION 11/18/2018  Narrative  LV end diastolic pressure is normal.  There is mild aortic stenosis by gradient.  Mild, diffuse nonobstructive CAD.  Ao sat 99%, PA sat 78%, CO 6.9 L/min; CI 3.3, mean PA pressure 18 mm Hg; mean PCWP 11 mm Hg  Transient atrial fibrillation noted during the cath, which affected obtaining waveform and gradient during pullback from LV to Ao. SABRA  Plan for surgical evaluation of aneurysm and aortic valve.  Continue preventive therapy.  Findings Coronary Findings Diagnostic  Dominance: Right  Left Anterior Descending The vessel exhibits minimal luminal irregularities.  Second Diagonal Branch The vessel exhibits minimal luminal irregularities.  Left Circumflex The vessel exhibits minimal luminal irregularities.  Right Coronary Artery The vessel exhibits minimal luminal irregularities.  Intervention  No interventions have been documented.    ECHOCARDIOGRAM  ECHOCARDIOGRAM COMPLETE 08/31/2019  Narrative ECHOCARDIOGRAM REPORT    Patient Name:   Joe Klein Date of Exam: 08/31/2019 Medical Rec #:  [id number]          Height:       72.0 in Accession #:    [id number]         Weight:       179.0 lb Date of Birth:  1961-04-28           BSA:          2.032 m Patient Age:    59 years           BP:           158/70 mmHg Patient Gender: M                  HR:           50 bpm. Exam Location:  [facility]  Procedure: 2D Echo  Indications:    : S/P AVR (aortic valve replacement) [Z95.2 (ICD-10-CM)]  History:        Patient has prior history of  Echocardiogram examinations, most recent 09/21/2018. CHF, S/P ascending aortic aneurysm repair, Ascending aorta enlargement, Bicuspid aortic valve, Arrythmias:Atrial Fibrillation; Signs/Symptoms:Hypertensive Heart Disease. Aortic Valve: 23 mm On X On X valve is present in the aortic position. Procedure Date: 12/17/2018.  Sonographer:    Lynwood Silvas Referring Phys: [phone] Fordyce Lepak J Zoria Rawlinson  IMPRESSIONS   1. Left ventricular ejection fraction, by estimation, is 65 to 70%. The left ventricle has normal function. The left ventricle has no regional wall motion abnormalities. There is moderate concentric left ventricular hypertrophy. Left ventricular diastolic parameters are consistent with Grade III diastolic dysfunction (restrictive). 2. Right ventricular systolic function is normal. The right ventricular size is normal. There is mildly elevated pulmonary artery systolic pressure. 3. The mitral valve is normal in structure. Trivial mitral valve regurgitation.  No evidence of mitral stenosis. 4. Replacement of ascending aorta with super coronary graft implantation (28 mm Hemashield platinum dacryon) proximally and a distal hemi-arch reconstruction under hypothermic circulatory arrest (17 minutes with 15 minutes of antegrade cerebral perfusion), aortic valve replacement (23 mm On-X mechanical valve), bilateral pulmonary vein isolation and left atrial appendage clipping. The aortic valve is normal in structure. Aortic valve regurgitation 2 trivial jets. No aortic stenosis is present. There is a 23 mm On X On X valve present in the aortic position. Procedure Date: 12/17/2018. Echo findings are consistent with normal structure and function of the aortic valve prosthesis. 5. The inferior vena cava is normal in size with greater than 50% respiratory variability, suggesting right atrial pressure of 3 mmHg.  FINDINGS Left Ventricle: Left ventricular ejection fraction, by estimation, is 65 to 70%. The left  ventricle has normal function. The left ventricle has no regional wall motion abnormalities. The left ventricular internal cavity size was normal in size. There is moderate concentric left ventricular hypertrophy. Left ventricular diastolic parameters are consistent with Grade III diastolic dysfunction (restrictive).  Right Ventricle: The right ventricular size is normal. No increase in right ventricular wall thickness. Right ventricular systolic function is normal. There is mildly elevated pulmonary artery systolic pressure. The tricuspid regurgitant velocity is 2.80 m/s, and with an assumed right atrial pressure of 10 mmHg, the estimated right ventricular systolic pressure is 41.4 mmHg.  Left Atrium: Left atrial size was normal in size.  Right Atrium: Right atrial size was normal in size.  Pericardium: There is no evidence of pericardial effusion.  Mitral Valve: The mitral valve is normal in structure. Normal mobility of the mitral valve leaflets. Trivial mitral valve regurgitation. No evidence of mitral valve stenosis. MV peak gradient, 5.1 mmHg. The mean mitral valve gradient is 2.0 mmHg.  Tricuspid Valve: The tricuspid valve is normal in structure. Tricuspid valve regurgitation is trivial. No evidence of tricuspid stenosis.  Aortic Valve: Replacement of ascending aorta with super coronary graft implantation (28 mm Hemashield platinum dacryon) proximally and a distal hemi-arch reconstruction under hypothermic circulatory arrest (17 minutes with 15 minutes of antegrade cerebral perfusion), aortic valve replacement (23 mm On-X mechanical valve), bilateral pulmonary vein isolation and left atrial appendage clipping. The aortic valve is normal in structure. Aortic valve regurgitation 2 trivial jets. No aortic stenosis is present. Aortic valve mean gradient measures 16.3 mmHg. Aortic valve peak gradient measures 32.7 mmHg. There is a 23 mm On X On X valve present in the aortic position. Procedure  Date: 12/17/2018. Echo findings are consistent with normal structure and function of the aortic valve prosthesis.  Pulmonic Valve: The pulmonic valve was normal in structure. Pulmonic valve regurgitation is not visualized. No evidence of pulmonic stenosis.  Aorta: The aortic root is normal in size and structure.  Venous: The pulmonary veins were not well visualized. The inferior vena cava is normal in size with greater than 50% respiratory variability, suggesting right atrial pressure of 3 mmHg.  IAS/Shunts: No atrial level shunt detected by color flow Doppler.   LEFT VENTRICLE PLAX 2D LVIDd:         5.60 cm Diastology LVIDs:         2.90 cm LV e' lateral:   10.10 cm/s LV PW:         1.50 cm LV E/e' lateral: 9.3 LV IVS:        1.50 cm LV e' medial:    5.85 cm/s LV E/e' medial:  16.0  RIGHT VENTRICLE            IVC RV S prime:     9.60 cm/s  IVC diam: 1.70 cm TAPSE (M-mode): 1.9 cm  LEFT ATRIUM             Index       RIGHT ATRIUM           Index LA diam:        4.20 cm 2.07 cm/m  RA Area:     18.30 cm LA Vol (A2C):   77.0 ml 37.89 ml/m RA Volume:   50.10 ml  24.65 ml/m LA Vol (A4C):   68.0 ml 33.46 ml/m LA Biplane Vol: 75.2 ml 37.00 ml/m AORTIC VALVE AV Vmax:           286.00 cm/s AV Vmean:          182.667 cm/s AV VTI:            0.711 m AV Peak Grad:      32.7 mmHg AV Mean Grad:      16.3 mmHg LVOT Vmax:         151.00 cm/s LVOT Vmean:        108.000 cm/s LVOT VTI:          0.355 m LVOT/AV VTI ratio: 0.50  AORTA Ao Root diam: 3.80 cm Ao Asc diam:  3.50 cm  MITRAL VALVE               TRICUSPID VALVE MV Area (PHT): 2.62 cm    TR Peak grad:   31.4 mmHg MV Peak grad:  5.1 mmHg    TR Vmax:        280.00 cm/s MV Mean grad:  2.0 mmHg MV Vmax:       1.13 m/s    SHUNTS MV Vmean:      60.2 cm/s   Systemic VTI: 0.36 m MV Decel Time: 290 msec MV E velocity: 93.60 cm/s MV A velocity: 24.30 cm/s MV E/A ratio:  3.85  Redell Leiter MD Electronically signed by Redell Leiter MD Signature Date/Time: 08/31/2019/12:13:36 PM    Final  TEE  ECHO INTRAOPERATIVE TEE 12/16/2018  Narrative *INTRAOPERATIVE TRANSESOPHAGEAL REPORT *    Patient Name:   LUVERNE ZERKLE Date of Exam: 12/16/2018 Medical Rec #:  [id number]          Height:       72.0 in Accession #:    [id number]         Weight:       180.6 lb Date of Birth:  1961-05-02           BSA:          2.04 m Patient Age:    58 years           BP:           142/63 mmHg Patient Gender: M                  HR:           51 bpm. Exam Location:  An[facility]iology  Transesophogeal exam was perform intraoperatively during surgical procedure. Patient was closely monitored under general anesthesia during the entirety of examination.  Indications:     Aortic valve stenosis, etiology of cardiac valve disease unspecified [I35.0]  Thoracic aortic aneurysm without rupture (HCC) [I71.2] History:         Abnormal ECG Risk Factors: Hypertension. Sonographer:     Annabella Fell  RDCS Performing Phys: [id number] BARTLETT Z ATKINS Diagnosing Phys: Norleen Pope MD  Complications: No known complications during this procedure. POST-OP IMPRESSIONS - Left Ventricle: The left ventricle is unchanged from pre-bypass. - Aorta: A graft was placed in the ascending aorta for repair. - Aortic Valve: No stenosis present. A bileaflet mechanical valve was placed, leaflets are freely mobile Size; 23mm. There is Trivial regurgitation. trivial. The gradient recorded across the prosthetic valve is within the expected range. No perivalvular leak noted. - Mitral Valve: The mitral valve appears unchanged from pre-bypass. - Tricuspid Valve: The tricuspid valve appears unchanged from pre-bypass.  PRE-OP FINDINGS Left Ventricle: The left ventricle has normal systolic function, with an ejection fraction of 60-65%. The cavity size was normal. There is severe concentric left ventricular hypertrophy.  Right Ventricle: The right ventricle has  normal systolic function. The cavity was normal. There is increased right ventricular wall thickness.  Left Atrium: Left atrial size was dilated.  Right Atrium: Right atrial size was normal in size. Right atrial pressure is estimated at 10 mmHg.  Interatrial Septum: No atrial level shunt detected by color flow Doppler. There is right bowing of the interatrial septum, suggestive of elevated left atrial pressure.  Pericardium: There is no evidence of pericardial effusion.  Mitral Valve: The mitral valve is normal in structure. Mild thickening of the mitral valve leaflet. Mitral valve regurgitation is trivial by color flow Doppler.  Tricuspid Valve: The tricuspid valve was normal in structure. Tricuspid valve regurgitation is trivial by color flow Doppler.  Aortic Valve: The aortic valve is bicuspid Aortic valve regurgitation is moderate by color flow Doppler. There is moderate-severe stenosis of the aortic valve, with a calculated valve area of 1.73 cm.  Pulmonic Valve: The pulmonic valve was normal in structure. Pulmonic valve regurgitation is trivial by color flow Doppler.   Aorta: There is dilatation of the ascending aorta measuring 42 mm. There is evidence of an atheroma immobile plaque in the descending aorta; Grade II, measuring 2-49mm in size.  +--------------+--------++ LEFT VENTRICLE         +--------------+--------++ PLAX 2D                +--------------+--------++ LVOT diam:    2.50 cm  +--------------+--------++ LVOT Area:    4.91 cm +--------------+--------++                        +--------------+--------++  +------------------+------------++ AORTIC VALVE                   +------------------+------------++ AV Area (Vmax):   1.66 cm     +------------------+------------++ AV Area (Vmean):  1.67 cm     +------------------+------------++ AV Area (VTI):    1.73 cm     +------------------+------------++ AV Vmax:           346.00 cm/s  +------------------+------------++ AV Vmean:         237.000 cm/s +------------------+------------++ AV VTI:           0.897 m      +------------------+------------++ AV Peak Grad:     47.9 mmHg    +------------------+------------++ AV Mean Grad:     26.0 mmHg    +------------------+------------++ LVOT Vmax:        117.00 cm/s  +------------------+------------++ LVOT Vmean:       80.800 cm/s  +------------------+------------++ LVOT VTI:         0.316 m      +------------------+------------++ LVOT/AV VTI ratio:0.35         +------------------+------------++   +--------------+-------+  SHUNTS                +--------------+-------+ Systemic VTI: 0.32 m  +--------------+-------+ Systemic Diam:2.50 cm +--------------+-------+   Norleen Pope MD Electronically signed by Norleen Pope MD Signature Date/Time: 12/16/2018/2:49:59 PM    Final  MONITORS  LONG TERM MONITOR (3-14 DAYS) 05/26/2022  Narrative Patch Wear Time:  13 days and 23 hours  Predominant underlying rhythm was sinus rhythm 20 SVT episodes, longest and fastest 12.8 seconds at 145 bpm Less than 1% ventricular ectopy 5.9% supraventricular ectopy Triggered episodes associated with sinus rhythm with supraventricular ectopy  Soyla Norton, MD               Recent Labs: 03/11/2023: ALT 19; BUN 28; Creatinine, Ser 1.86; Hemoglobin 14.8; Magnesium  2.1; Platelets 209; Potassium 4.5; Sodium 139; TSH 2.750  Recent Lipid Panel    Component Value Date/Time   CHOL 113 03/11/2023 1035   TRIG 100 03/11/2023 1035   HDL 31 (L) 03/11/2023 1035   CHOLHDL 3.6 03/11/2023 1035   LDLCALC 63 03/11/2023 1035    Physical Exam:    VS:  BP 132/74   Pulse 68   Ht 6' 1 (1.854 m)   Wt 188 lb 9.6 oz (85.5 kg)   SpO2 97%   BMI 24.88 kg/m     Wt Readings from Last 3 Encounters:  06/08/23 188 lb 9.6 oz (85.5 kg)  03/11/23 188 lb (85.3 kg)  12/02/22 185 lb (83.9  kg)     GEN:  Well nourished, well developed in no acute distress HEENT: Normal NECK: No JVD; No carotid bruits LYMPHATICS: No lymphadenopathy CARDIAC: RRR, normal exam for mechanical AVR sharp closing sound no murmur in particular no valvular regurgitation RESPIRATORY:  Clear to auscultation without rales, wheezing or rhonchi  ABDOMEN: Soft, non-tender, non-distended MUSCULOSKELETAL:  No edema; No deformity  SKIN: Warm and dry NEUROLOGIC:  Alert and oriented x 3 PSYCHIATRIC:  Normal affect    Signed, Redell Leiter, MD  06/08/2023 11:00 AM    [facility]

## 2023-06-08 ENCOUNTER — Encounter: Payer: Self-pay | Admitting: Cardiology

## 2023-06-08 ENCOUNTER — Ambulatory Visit: Payer: Medicaid Other | Attending: Cardiology | Admitting: Cardiology

## 2023-06-08 VITALS — BP 132/74 | HR 68 | Ht 73.0 in | Wt 188.6 lb

## 2023-06-08 DIAGNOSIS — Z952 Presence of prosthetic heart valve: Secondary | ICD-10-CM

## 2023-06-08 DIAGNOSIS — E0590 Thyrotoxicosis, unspecified without thyrotoxic crisis or storm: Secondary | ICD-10-CM

## 2023-06-08 DIAGNOSIS — I5042 Chronic combined systolic (congestive) and diastolic (congestive) heart failure: Secondary | ICD-10-CM

## 2023-06-08 DIAGNOSIS — Z7901 Long term (current) use of anticoagulants: Secondary | ICD-10-CM | POA: Diagnosis not present

## 2023-06-08 DIAGNOSIS — I480 Paroxysmal atrial fibrillation: Secondary | ICD-10-CM | POA: Diagnosis not present

## 2023-06-08 DIAGNOSIS — I130 Hypertensive heart and chronic kidney disease with heart failure and stage 1 through stage 4 chronic kidney disease, or unspecified chronic kidney disease: Secondary | ICD-10-CM

## 2023-06-08 DIAGNOSIS — N183 Chronic kidney disease, stage 3 unspecified: Secondary | ICD-10-CM

## 2023-06-08 NOTE — Patient Instructions (Addendum)
Medication Instructions:  Your physician recommends that you continue on your current medications as directed. Please refer to the Current Medication list given to you today.  *If you need a refill on your cardiac medications before your next appointment, please call your pharmacy*   Lab Work: None If you have labs (blood work) drawn today and your tests are completely normal, you will receive your results only by: MyChart Message (if you have MyChart) OR A paper copy in the mail If you have any lab test that is abnormal or we need to change your treatment, we will call you to review the results.   Testing/Procedures: Your physician has requested that you have an echocardiogram. Echocardiography is a painless test that uses sound waves to create images of your heart. It provides your doctor with information about the size and shape of your heart and how well your heart's chambers and valves are working. This procedure takes approximately one hour. There are no restrictions for this procedure. Please do NOT wear cologne, perfume, aftershave, or lotions (deodorant is allowed). Please arrive 15 minutes prior to your appointment time.  Please note: We ask at that you not bring children with you during ultrasound (echo/ vascular) testing. Due to room size and safety concerns, children are not allowed in the ultrasound rooms during exams. Our front office staff cannot provide observation of children in our lobby area while testing is being conducted. An adult accompanying a patient to their appointment will only be allowed in the ultrasound room at the discretion of the ultrasound technician under special circumstances. We apologize for any inconvenience.    Follow-Up: At  [facility], you and your health needs are our priority.  As part of our continuing mission to provide you with exceptional heart care, we have created designated Provider Care Teams.  These Care Teams include your  primary Cardiologist (physician) and Advanced Practice Providers (APPs -  Physician Assistants and Nurse Practitioners) who all work together to provide you with the care you need, when you need it.  We recommend signing up for the patient portal called "MyChart".  Sign up information is provided on this After Visit Summary.  MyChart is used to connect with patients for Virtual Visits (Telemedicine).  Patients are able to view lab/test results, encounter notes, upcoming appointments, etc.  Non-urgent messages can be sent to your provider as well.   To learn more about what you can do with MyChart, go to [email or web address].    Your next appointment:   9 months  Provider:   Norman Herrlich, MD    Other Instructions None

## 2023-06-10 ENCOUNTER — Other Ambulatory Visit: Payer: Self-pay | Admitting: Internal Medicine

## 2023-06-28 ENCOUNTER — Other Ambulatory Visit: Payer: Self-pay | Admitting: Internal Medicine

## 2023-06-29 LAB — PROTIME-INR
INR: 2 — ABNORMAL HIGH (ref 0.9–1.2)
Prothrombin Time: 21.7 s — ABNORMAL HIGH (ref 9.1–12.0)

## 2023-07-06 ENCOUNTER — Other Ambulatory Visit: Payer: Self-pay | Admitting: Cardiology

## 2023-07-07 NOTE — Telephone Encounter (Signed)
 Prescription sent to pharmacy.

## 2023-07-15 ENCOUNTER — Other Ambulatory Visit (HOSPITAL_BASED_OUTPATIENT_CLINIC_OR_DEPARTMENT_OTHER): Payer: Self-pay

## 2023-07-15 ENCOUNTER — Ambulatory Visit (HOSPITAL_BASED_OUTPATIENT_CLINIC_OR_DEPARTMENT_OTHER)
Admission: EM | Admit: 2023-07-15 | Discharge: 2023-07-15 | Disposition: A | Discharge status: Home / Self Care | Attending: Family Medicine | Admitting: Family Medicine

## 2023-07-15 ENCOUNTER — Encounter (HOSPITAL_BASED_OUTPATIENT_CLINIC_OR_DEPARTMENT_OTHER): Payer: Self-pay | Admitting: Emergency Medicine

## 2023-07-15 DIAGNOSIS — J301 Allergic rhinitis due to pollen: Secondary | ICD-10-CM | POA: Diagnosis not present

## 2023-07-15 DIAGNOSIS — R0981 Nasal congestion: Secondary | ICD-10-CM | POA: Diagnosis not present

## 2023-07-15 DIAGNOSIS — J3489 Other specified disorders of nose and nasal sinuses: Secondary | ICD-10-CM | POA: Diagnosis not present

## 2023-07-15 MED ORDER — MONTELUKAST SODIUM 10 MG PO TABS
10.0000 mg | ORAL_TABLET | Freq: Every evening | ORAL | 0 refills | Status: DC | PRN
  Filled 2023-07-15: qty 30, 30d supply, fill #0

## 2023-07-15 MED ORDER — CETIRIZINE HCL 10 MG PO TABS
10.0000 mg | ORAL_TABLET | Freq: Every evening | ORAL | 0 refills | Status: AC | PRN
  Filled 2023-07-15: qty 30, 30d supply, fill #0

## 2023-07-15 MED ORDER — PREDNISONE 20 MG PO TABS
20.0000 mg | ORAL_TABLET | Freq: Every day | ORAL | 0 refills | Status: AC
  Filled 2023-07-15: qty 5, 5d supply, fill #0

## 2023-07-15 NOTE — ED Triage Notes (Signed)
 Pt reports he has seasonal allergies he was hiking around [place], he c/o sinus congestion, sneezing , coughing since Monday. He has taken Zyrtec

## 2023-07-15 NOTE — ED Provider Notes (Signed)
 [facility]    CSN: [id number] Arrival date & time: 07/15/23  0801      History   Chief Complaint Chief Complaint  Patient presents with   Cough   Nasal Congestion    HPI Joe Klein is a 63 y.o. male.   The patient has a complex medical history with previous open heart surgery and valve replacement, intermittent congestive heart failure, chronic kidney disease.  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  He is on Jardiance due to the congestive heart failure.  He is very fit and regularly hikes.  He was hiking over the previous weekend (March 8 to 9, 2025).  He feels like he got a big exposure to tree pollens and became very congested.  He was up all last night due to congestion.  He has been taking Zyrtec, 10 mg daily but he is cautious with it because it makes him very sleepy.  His head feels very full but no sinus pain.  His nasal discharge is clear or white.  He is sneezing a lot and just does not feel good.  He has had a few times when he felt a little damper like he was perspiring but he is not aware of an actual fever.  He denies nausea, vomiting, diarrhea, constipation.   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   Atrial fibrillation (HCC) 12/26/2018   Bicuspid aortic valve 10/25/2018   CHF (congestive heart failure) (HCC)    Chronic combined systolic and diastolic heart failure (HCC) 10/25/2018   Chronic kidney disease    stage 3   CKD (chronic kidney disease) stage 3, GFR 30-59 ml/min (HCC) 10/25/2018   Dysrhythmia    A-fib   Encounter for therapeutic drug monitoring 12/26/2018   Essential hypertension 06/03/2022   Hoarseness 06/03/2022   Hypertension    Hypertensive heart  disease with combined systolic and diastolic heart failure and stage 3 chronic kidney disease (HCC) 10/25/2018   Hyperthyroidism 06/03/2022   Insomnia 09/22/2022   Long term (current) use of anticoagulants 12/26/2018   S/P ascending aortic aneurysm repair 12/16/2018   Thoracic aortic aneurysm without rupture [facility])     Patient Active Problem List   Diagnosis Date Noted   Anxiety 09/22/2022   Insomnia 09/22/2022   Hoarseness 06/03/2022   Hyperthyroidism 06/03/2022   Essential hypertension 06/03/2022   Hypertension    Dysrhythmia    Chronic kidney disease    AAA (abdominal aortic aneurysm) (HCC)    CHF (congestive heart failure) (HCC)    Long term (current) use of anticoagulants 12/26/2018   Atrial fibrillation (HCC) 12/26/2018   Encounter for therapeutic drug monitoring 12/26/2018   S/P ascending aortic aneurysm repair 12/16/2018   Thoracic aortic aneurysm without rupture (HCC)    Bicuspid aortic valve 10/25/2018   Aortic stenosis 10/25/2018   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 and stage 3 chronic kidney disease (HCC) 10/25/2018   Chronic combined systolic and diastolic heart failure (HCC) 10/25/2018   CKD (chronic kidney disease) stage 3, GFR 30-59 ml/min (HCC) 10/25/2018    Past Surgical History:  Procedure Laterality Date   AORTIC  VALVE REPLACEMENT N/A 12/16/2018   Procedure: AORTIC VALVE REPLACEMENT (AVR);  Surgeon: Linden Dolin, MD;  Location: [facility] OR;  Service: Open Heart Surgery;  Laterality: N/A;   CARDIOVERSION N/A 11/21/2018   Procedure: CARDIOVERSION (CATH LAB);  Surgeon: Marinus Maw, MD;  Location: [facility] INVASIVE CV LAB;  Service: Cardiovascular;  Laterality: N/A;   CLIPPING OF ATRIAL APPENDAGE Left 12/16/2018   Procedure: CLIPPING OF ATRIAL APPENDAGE;  Surgeon: Linden Dolin, MD;  Location: [facility] OR;  Service: Open Heart Surgery;  Laterality: Left;  Appendage clipping with  AtriCure AtriClip 50.   MANDIBLE FRACTURE SURGERY     MAZE N/A 12/16/2018   Procedure: MAZE;  Surgeon: Linden Dolin, MD;  Location: [facility] OR;  Service: Open Heart Surgery;  Laterality: N/A;   RIGHT/LEFT HEART CATH AND CORONARY ANGIOGRAPHY N/A 11/18/2018   Procedure: RIGHT/LEFT HEART CATH AND CORONARY ANGIOGRAPHY;  Surgeon: Corky Crafts, MD;  Location: [facility] INVASIVE CV LAB;  Service: Cardiovascular;  Laterality: N/A;   TEE WITHOUT CARDIOVERSION N/A 12/16/2018   Procedure: TRANSESOPHAGEAL ECHOCARDIOGRAM (TEE);  Surgeon: Linden Dolin, MD;  Location: [facility] OR;  Service: Open Heart Surgery;  Laterality: N/A;   THORACIC AORTIC ANEURYSM REPAIR N/A 12/16/2018   Procedure: THORACIC ASCENDING ANEURYSM REPAIR;  Surgeon: Linden Dolin, MD;  Location: [facility] OR;  Service: Open Heart Surgery;  Laterality: N/A;   TONSILLECTOMY         Home Medications    Prior to Admission medications   Medication Sig Start Date End Date Taking? Authorizing Provider  aspirin 81 MG EC tablet Take 81 mg by mouth daily.    Yes [provider]  atorvastatin (LIPITOR) 40 MG tablet Take 1 tablet by mouth once daily 07/07/23  Yes Munley, Iline Oven, MD  cetirizine (ZYRTEC) 10 MG tablet Take 1 tablet (10 mg total) by mouth at bedtime as needed for allergies. 07/15/23  Yes Prescilla Sours, FNP  furosemide (LASIX) 20 MG tablet Take 20 mg by mouth daily as needed (as directed).   Yes [provider]  hydrALAZINE (APRESOLINE) 100 MG tablet Take 1 tablet (100 mg total) by mouth 2 (two) times daily. 12/02/22  Yes Crist Fat, MD  JARDIANCE 10 MG TABS tablet TAKE 1 TABLET BY MOUTH ONCE DAILY IN THE MORNING 03/22/23  Yes Crist Fat, MD  labetalol (NORMODYNE) 100 MG tablet Take 1 tablet by mouth twice daily 02/22/23  Yes Munley, Iline Oven, MD  montelukast (SINGULAIR) 10 MG tablet Take 1 tablet (10 mg total) by mouth at bedtime as needed (allergies). 07/15/23  Yes Prescilla Sours, FNP  predniSONE (DELTASONE) 20 MG tablet  Take 1 tablet (20 mg total) by mouth daily with breakfast for 5 days. 07/15/23 07/20/23 Yes Prescilla Sours, FNP  warfarin (COUMADIN) 2.5 MG tablet TAKE 2 TABLETS BY MOUTH ON MON, WED, AND FRI. TAKE 1 TABLET ON TUE, THUR, SAT, AND SUN. 06/10/23  Yes Crist Fat, MD    Family History Family History  Problem Relation Age of Onset   Heart disease Mother    Hypertension Mother    Cancer Father    Hypertension Brother     Social History Social History   Tobacco Use   Smoking status: Never    Passive exposure: Never   Smokeless tobacco: Never  Vaping Use   Vaping status: Never Used  Substance Use Topics   Alcohol use: Never   Drug use: Never     Allergies   Amiodarone   Review of  Systems Review of Systems  Constitutional:  Negative for chills and fever.  HENT:  Positive for congestion, postnasal drip, rhinorrhea and sneezing. Negative for ear pain and sore throat.   Eyes:  Negative for pain and visual disturbance.  Respiratory:  Positive for cough.   Cardiovascular:  Negative for chest pain and palpitations.  Gastrointestinal:  Negative for abdominal pain, constipation, diarrhea, nausea and vomiting.  Genitourinary:  Negative for dysuria and hematuria.  Musculoskeletal:  Negative for arthralgias and back pain.  Skin:  Negative for color change and rash.  Neurological:  Negative for seizures and syncope.  All other systems reviewed and are negative.    Physical Exam Triage Vital Signs ED Triage Vitals  Encounter Vitals Group     BP 07/15/23 0815 (!) 165/86     Systolic BP Percentile --      Diastolic BP Percentile --      Pulse Rate 07/15/23 0815 65     Resp 07/15/23 0815 18     Temp 07/15/23 0815 98.3 F (36.8 C)     Temp Source 07/15/23 0815 Oral     SpO2 07/15/23 0815 96 %     Weight --      Height --      Head Circumference --      Peak Flow --      Pain Score 07/15/23 0812 0     Pain Loc --      Pain Education --      Exclude from Growth Chart --    No  data found.  Updated Vital Signs BP (!) 165/86 (BP Location: Left Arm)   Pulse 65   Temp 98.3 F (36.8 C) (Oral)   Resp 18   SpO2 96%   Visual Acuity Right Eye Distance:   Left Eye Distance:   Bilateral Distance:    Right Eye Near:   Left Eye Near:    Bilateral Near:     Physical Exam Vitals and nursing note reviewed.  Constitutional:      General: He is not in acute distress.    Appearance: He is well-developed. He is not ill-appearing or toxic-appearing.  HENT:     Head: Normocephalic and atraumatic.     Right Ear: Hearing, tympanic membrane, ear canal and external ear normal.     Left Ear: Hearing, tympanic membrane, ear canal and external ear normal.     Nose: Congestion and rhinorrhea present. Rhinorrhea is clear.     Right Sinus: No maxillary sinus tenderness or frontal sinus tenderness.     Left Sinus: No maxillary sinus tenderness or frontal sinus tenderness.     Mouth/Throat:     Lips: Pink.     Mouth: Mucous membranes are moist.     Pharynx: Uvula midline. No oropharyngeal exudate or posterior oropharyngeal erythema.     Tonsils: No tonsillar exudate.  Eyes:     Conjunctiva/sclera: Conjunctivae normal.     Pupils: Pupils are equal, round, and reactive to light.  Cardiovascular:     Rate and Rhythm: Normal rate and regular rhythm.     Heart sounds: S1 normal and S2 normal. No murmur heard. Pulmonary:     Effort: Pulmonary effort is normal. No respiratory distress.     Breath sounds: Normal breath sounds. No decreased breath sounds, wheezing, rhonchi or rales.  Abdominal:     General: Bowel sounds are normal.     Palpations: Abdomen is soft.     Tenderness: There is no abdominal  tenderness.  Musculoskeletal:        General: No swelling.     Cervical back: Neck supple.  Lymphadenopathy:     Head:     Right side of head: No submental, submandibular, tonsillar, preauricular or posterior auricular adenopathy.     Left side of head: No submental,  submandibular, tonsillar, preauricular or posterior auricular adenopathy.     Cervical: No cervical adenopathy.     Right cervical: No superficial cervical adenopathy.    Left cervical: No superficial cervical adenopathy.  Skin:    General: Skin is warm and dry.     Capillary Refill: Capillary refill takes less than 2 seconds.     Findings: No rash.  Neurological:     Mental Status: He is alert and oriented to person, place, and time.  Psychiatric:        Mood and Affect: Mood normal.      UC Treatments / Results  Labs (all labs ordered are listed, but only abnormal results are displayed) Labs Reviewed - No data to display  EKG   Radiology No results found.  Procedures Procedures (including critical care time)  Medications Ordered in UC Medications - No data to display  Initial Impression / Assessment and Plan / UC Course  I have reviewed the triage vital signs and the nursing notes.  Pertinent labs & imaging results that were available during my care of the patient were reviewed by me and considered in my medical decision making (see chart for details).     Spent extra time reviewing his medical history and medications and discussing both with the patient.  He is very cautious about medications due to previous medical events.  Given his history and chronic health conditions and medications: Will use cetirizine, 10 mg nightly if needed for allergies.  Will use montelukast 10 mg nightly if needed for allergies.  Will add prednisone, 20 mg daily in the morning for 5 days.  Reviewed the risk benefits and alternatives to prednisone use and side effects that might indicate that the prednisone is causing fluid retention or putting him at risk for CHF.  Advised he can stop the prednisone at any time.  Using oral prednisone instead of Kenalog because once he has a shot he just has to go through what ever it does to him and I prefer him to have the pills and be able to stop the pills  if needed.  Follow-up here or with primary care if symptoms do not improve, worsen or new symptoms occur.  Encouraged to get some N95 masks from the drugstore and wear them while hiking if he is going to be hiking during the worst pollen seasons.  Additional time during the visit (30 minutes). Final Clinical Impressions(s) / UC Diagnoses   Final diagnoses:  Seasonal allergic rhinitis due to pollen  Nasal congestion with rhinorrhea     Discharge Instructions      Spent extra time with the patient discussing his medical conditions and medications.  He has a complex cardiac history.  Given his history we will treat him with cetirizine, 10 mg 1 daily at night for allergies.  Will add montelukast, 10 mg 1 daily at night for allergies.  He will take the cetirizine and montelukast daily for now but he may stop them at any point that he is not having allergy problems and only use them as needed.  Adding prednisone, 20 mg 1 daily for 5 days, take in the mornings.  Reviewed  the risk benefits and alternatives and probable side effects of prednisone.  Patient may stop the prednisone at any time if he is having swelling in his legs or sign of congestive heart failure.  Encouraged to possibly get a good N95 mask to wear if he is going to be hiking in the woods a lot during allergy season to reduce some of his exposures.  Continue the hiking it is great for his cardiovascular health.     ED Prescriptions     Medication Sig Dispense Auth. Provider   montelukast (SINGULAIR) 10 MG tablet Take 1 tablet (10 mg total) by mouth at bedtime as needed (allergies). 30 tablet Prescilla Sours, FNP   cetirizine (ZYRTEC) 10 MG tablet Take 1 tablet (10 mg total) by mouth at bedtime as needed for allergies. 30 tablet Prescilla Sours, FNP   predniSONE (DELTASONE) 20 MG tablet Take 1 tablet (20 mg total) by mouth daily with breakfast for 5 days. 5 tablet Prescilla Sours, FNP      PDMP not reviewed this encounter.    Prescilla Sours, FNP 07/15/23 [id number]

## 2023-07-15 NOTE — Discharge Instructions (Addendum)
 Spent extra time with the patient discussing his medical conditions and medications.  He has a complex cardiac history.  Given his history we will treat him with cetirizine, 10 mg 1 daily at night for allergies.  Will add montelukast, 10 mg 1 daily at night for allergies.  He will take the cetirizine and montelukast daily for now but he may stop them at any point that he is not having allergy problems and only use them as needed.  Adding prednisone, 20 mg 1 daily for 5 days, take in the mornings.  Reviewed the risk benefits and alternatives and probable side effects of prednisone.  Patient may stop the prednisone at any time if he is having swelling in his legs or sign of congestive heart failure.  Encouraged to possibly get a good N95 mask to wear if he is going to be hiking in the woods a lot during allergy season to reduce some of his exposures.  Continue the hiking it is great for his cardiovascular health.

## 2023-07-17 ENCOUNTER — Telehealth (HOSPITAL_BASED_OUTPATIENT_CLINIC_OR_DEPARTMENT_OTHER): Payer: Self-pay

## 2023-08-09 ENCOUNTER — Other Ambulatory Visit: Payer: Self-pay | Admitting: Internal Medicine

## 2023-08-10 LAB — PROTIME-INR
INR: 2 — ABNORMAL HIGH (ref 0.9–1.2)
Prothrombin Time: 21.4 s — ABNORMAL HIGH (ref 9.1–12.0)

## 2023-08-30 ENCOUNTER — Other Ambulatory Visit: Payer: Self-pay | Admitting: Internal Medicine

## 2023-09-03 ENCOUNTER — Other Ambulatory Visit: Payer: Self-pay | Admitting: Cardiology

## 2023-09-03 DIAGNOSIS — Z952 Presence of prosthetic heart valve: Secondary | ICD-10-CM

## 2023-09-03 DIAGNOSIS — I10 Essential (primary) hypertension: Secondary | ICD-10-CM

## 2023-09-19 NOTE — Progress Notes (Signed)
  Electrophysiology Office Note:   Date:  09/20/2023  ID:  Joe Klein, DOB August 20, 1960, MRN [id number]  Primary Cardiologist: Zoe Hinds, MD Primary Heart Failure: None Electrophysiologist: Kinan Safley [name], MD      History of Present Illness:   Joe Klein is a 63 y.o. male with h/o atrial fibrillation, bicuspid aortic valve with ascending aortic aneurysm post Bentall with maze and left atrial appendage clip, hypertension, CKD stage III seen today for routine electrophysiology followup.   Since last being seen in our clinic the patient reports doing well.  He notes no further episodes of atrial fibrillation.  He does have intermittent palpitations on some nights that he attributes to PACs.  Despite this, he remains quite active.  He has no acute complaints or issues..  he denies chest pain, palpitations, dyspnea, PND, orthopnea, nausea, vomiting, dizziness, syncope, edema, weight gain, or early satiety.   Review of systems complete and found to be negative unless listed in HPI.   EP Information / Studies Reviewed:    EKG is ordered today. Personal review as below.  Sinus rhythm with PACs    Risk Assessment/Calculations:    CHA2DS2-VASc Score = 2   This indicates a 2.2% annual risk of stroke. The patient's score is based upon: CHF History: 0 HTN History: 1 Diabetes History: 0 Stroke History: 0 Vascular Disease History: 1 Age Score: 0 Gender Score: 0            Physical Exam:   VS:  BP (!) 150/88   Pulse (!) 56   Ht 6\' 1"  (1.854 m)   Wt 190 lb 6.4 oz (86.4 kg)   SpO2 97%   BMI 25.12 kg/m    Wt Readings from Last 3 Encounters:  09/20/23 190 lb 6.4 oz (86.4 kg)  06/08/23 188 lb 9.6 oz (85.5 kg)  03/11/23 188 lb (85.3 kg)     GEN: Well nourished, well developed in no acute distress NECK: No JVD; No carotid bruits CARDIAC: Regular rate and rhythm, no murmurs, rubs, gallops RESPIRATORY:  Clear to auscultation without rales, wheezing or rhonchi   ABDOMEN: Soft, non-tender, non-distended EXTREMITIES:  No edema; No deformity   ASSESSMENT AND PLAN:    1.  Paroxysmal atrial fibrillation: Postsurgical maze and left atrial appendage clip.  On labetalol .  He has had no further episodes of atrial fibrillation.  He is feeling well.  He has no acute complaints.  2.  Secondary hypercoagulable state: On warfarin for AVR and atrial fibrillation  3.  Post AVR with aortic root replacement: Stable on most recent echo.  Plan per primary cardiology.  4.  Hypertension: Elevated today.  Is just recently taking his medicine.  Plan per primary physician.  Follow up with Dr. Lawana Pray as needed   Signed, Amaro Mangold [name], MD

## 2023-09-20 ENCOUNTER — Encounter: Payer: Self-pay | Admitting: Cardiology

## 2023-09-20 ENCOUNTER — Ambulatory Visit: Payer: Medicaid Other | Attending: Cardiology | Admitting: Cardiology

## 2023-09-20 VITALS — BP 150/88 | HR 56 | Ht 73.0 in | Wt 190.4 lb

## 2023-09-20 DIAGNOSIS — I480 Paroxysmal atrial fibrillation: Secondary | ICD-10-CM | POA: Diagnosis present

## 2023-09-20 DIAGNOSIS — D6869 Other thrombophilia: Secondary | ICD-10-CM

## 2023-09-20 DIAGNOSIS — I10 Essential (primary) hypertension: Secondary | ICD-10-CM

## 2023-09-20 NOTE — Patient Instructions (Signed)
 Medication Instructions:  Your physician recommends that you continue on your current medications as directed. Please refer to the Current Medication list given to you today.  *If you need a refill on your cardiac medications before your next appointment, please call your pharmacy*  Lab Work: None ordered  If you have any lab test that is abnormal or we need to change your treatment, we will call you to review the results.  Testing/Procedures: None ordered  Follow-Up: At [facility], you and your health needs are our priority.  As part of our continuing mission to provide you with exceptional heart care, our providers are all part of one team.  This team includes your primary Cardiologist (physician) and Advanced Practice Providers or APPs (Physician Assistants and Nurse Practitioners) who all work together to provide you with the care you need, when you need it.  Your next appointment:   As  needed  Provider:   Agatha Horsfall, MD     Thank you for choosing [facility]!!   Reece Cane, RN [phone]

## 2023-09-22 ENCOUNTER — Other Ambulatory Visit: Payer: Self-pay | Admitting: Internal Medicine

## 2023-09-23 LAB — PROTIME-INR
INR: 2.6 — ABNORMAL HIGH (ref 0.9–1.2)
Prothrombin Time: 27.3 s — ABNORMAL HIGH (ref 9.1–12.0)

## 2023-09-24 ENCOUNTER — Ambulatory Visit: Payer: Medicaid Other

## 2023-09-28 ENCOUNTER — Other Ambulatory Visit: Payer: Self-pay | Admitting: Internal Medicine

## 2023-10-29 ENCOUNTER — Other Ambulatory Visit: Payer: Self-pay | Admitting: Internal Medicine

## 2023-11-01 ENCOUNTER — Other Ambulatory Visit: Payer: Self-pay

## 2023-11-03 ENCOUNTER — Other Ambulatory Visit: Payer: Self-pay | Admitting: Internal Medicine

## 2023-11-04 LAB — PROTIME-INR
INR: 2.6 — ABNORMAL HIGH (ref 0.9–1.2)
Prothrombin Time: 27.2 s — ABNORMAL HIGH (ref 9.1–12.0)

## 2023-11-12 ENCOUNTER — Encounter: Payer: Self-pay | Admitting: Urology

## 2023-11-21 ENCOUNTER — Ambulatory Visit
Admission: RE | Admit: 2023-11-21 | Discharge: 2023-11-21 | Disposition: A | Discharge status: Home / Self Care | Source: Ambulatory Visit | Attending: Urology | Admitting: Urology

## 2023-11-21 DIAGNOSIS — R972 Elevated prostate specific antigen [PSA]: Secondary | ICD-10-CM

## 2023-11-21 MED ORDER — GADOPICLENOL 0.5 MMOL/ML IV SOLN
8.0000 mL | Freq: Once | INTRAVENOUS | Status: AC | PRN
  Administered 2023-11-21: 8 mL via INTRAVENOUS

## 2023-12-03 ENCOUNTER — Other Ambulatory Visit: Payer: Self-pay | Admitting: Cardiology

## 2023-12-03 DIAGNOSIS — I10 Essential (primary) hypertension: Secondary | ICD-10-CM

## 2023-12-03 DIAGNOSIS — Z952 Presence of prosthetic heart valve: Secondary | ICD-10-CM

## 2023-12-06 ENCOUNTER — Other Ambulatory Visit: Payer: Medicaid Other

## 2023-12-07 ENCOUNTER — Ambulatory Visit

## 2023-12-09 ENCOUNTER — Other Ambulatory Visit: Payer: Self-pay | Admitting: Internal Medicine

## 2023-12-20 ENCOUNTER — Other Ambulatory Visit: Payer: Self-pay | Admitting: Internal Medicine

## 2023-12-21 LAB — PROTIME-INR
INR: 2.6 — ABNORMAL HIGH (ref 0.9–1.2)
Prothrombin Time: 26.2 s — ABNORMAL HIGH (ref 9.1–12.0)

## 2023-12-24 ENCOUNTER — Ambulatory Visit: Attending: Cardiology

## 2023-12-24 DIAGNOSIS — Z952 Presence of prosthetic heart valve: Secondary | ICD-10-CM | POA: Diagnosis present

## 2023-12-24 DIAGNOSIS — E0590 Thyrotoxicosis, unspecified without thyrotoxic crisis or storm: Secondary | ICD-10-CM | POA: Diagnosis present

## 2023-12-24 DIAGNOSIS — N183 Chronic kidney disease, stage 3 unspecified: Secondary | ICD-10-CM | POA: Insufficient documentation

## 2023-12-24 DIAGNOSIS — I5042 Chronic combined systolic (congestive) and diastolic (congestive) heart failure: Secondary | ICD-10-CM | POA: Insufficient documentation

## 2023-12-24 DIAGNOSIS — I130 Hypertensive heart and chronic kidney disease with heart failure and stage 1 through stage 4 chronic kidney disease, or unspecified chronic kidney disease: Secondary | ICD-10-CM | POA: Diagnosis present

## 2023-12-24 DIAGNOSIS — Z7901 Long term (current) use of anticoagulants: Secondary | ICD-10-CM | POA: Diagnosis present

## 2023-12-24 DIAGNOSIS — I480 Paroxysmal atrial fibrillation: Secondary | ICD-10-CM | POA: Insufficient documentation

## 2023-12-26 LAB — ECHOCARDIOGRAM COMPLETE
AR max vel: 1.83 cm2
AV Area VTI: 2.18 cm2
AV Area mean vel: 2.05 cm2
AV Mean grad: 11 mmHg
AV Peak grad: 26 mmHg
Ao pk vel: 2.55 m/s
Area-P 1/2: 4.02 cm2
S' Lateral: 3.3 cm

## 2023-12-27 ENCOUNTER — Ambulatory Visit: Payer: Self-pay | Admitting: Cardiology

## 2023-12-27 ENCOUNTER — Encounter: Payer: Self-pay | Admitting: Cardiology

## 2023-12-31 ENCOUNTER — Other Ambulatory Visit: Payer: Self-pay | Admitting: Internal Medicine

## 2024-01-04 ENCOUNTER — Other Ambulatory Visit: Payer: Self-pay | Admitting: Internal Medicine

## 2024-01-18 ENCOUNTER — Encounter: Payer: Self-pay | Admitting: Cardiology

## 2024-01-31 ENCOUNTER — Other Ambulatory Visit: Payer: Self-pay | Admitting: Internal Medicine

## 2024-02-02 ENCOUNTER — Other Ambulatory Visit: Payer: Self-pay | Admitting: Internal Medicine

## 2024-02-03 LAB — PROTIME-INR
INR: 3.2 — ABNORMAL HIGH (ref 0.9–1.2)
Prothrombin Time: 32.5 s — ABNORMAL HIGH (ref 9.1–12.0)

## 2024-03-14 ENCOUNTER — Encounter: Payer: Self-pay | Admitting: Cardiology

## 2024-03-14 ENCOUNTER — Ambulatory Visit: Attending: Cardiology | Admitting: Cardiology

## 2024-03-14 VITALS — BP 170/70 | HR 58 | Ht 73.0 in | Wt 189.2 lb

## 2024-03-14 DIAGNOSIS — Z952 Presence of prosthetic heart valve: Secondary | ICD-10-CM | POA: Diagnosis present

## 2024-03-14 DIAGNOSIS — Z7901 Long term (current) use of anticoagulants: Secondary | ICD-10-CM | POA: Insufficient documentation

## 2024-03-14 DIAGNOSIS — I480 Paroxysmal atrial fibrillation: Secondary | ICD-10-CM | POA: Diagnosis not present

## 2024-03-14 DIAGNOSIS — N183 Chronic kidney disease, stage 3 unspecified: Secondary | ICD-10-CM | POA: Diagnosis present

## 2024-03-14 DIAGNOSIS — I5042 Chronic combined systolic (congestive) and diastolic (congestive) heart failure: Secondary | ICD-10-CM | POA: Diagnosis present

## 2024-03-14 DIAGNOSIS — I130 Hypertensive heart and chronic kidney disease with heart failure and stage 1 through stage 4 chronic kidney disease, or unspecified chronic kidney disease: Secondary | ICD-10-CM | POA: Diagnosis not present

## 2024-03-14 NOTE — Patient Instructions (Signed)

## 2024-03-14 NOTE — Progress Notes (Signed)
 Cardiology Office Note:    Date:  03/14/2024   ID:  Joe Klein, DOB May 25, 1960, MRN [id number]  PCP:  No primary care provider on file.  Cardiologist:  Redell Leiter, MD    Referring MD: Fleeta Finger, Selinda, MD    ASSESSMENT:    1. S/P AVR (aortic valve replacement)   2. Chronic anticoagulation   3. 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 Paroxysmal atrial fibrillation (HCC)    PLAN:    In order of problems listed above:  Continues to have a good long-term durable result from AVR with On-X valve I prefer an INR in the range of 2.5-3 He needs establish primary care for management of his anticoagulation he requested referral to Dr.sCHULTZ BP at target heart failure compensated ejection fraction is normalized continue his treatment including furosemide  as needed hydralazine  and labetalol  No recurrence of atrial fibrillation off amiodarone    38-month follow-up   Medication Adjustments/Labs and Tests Ordered: Current medicines are reviewed at length with the patient today.  Concerns regarding medicines are outlined above.  Orders Placed This Encounter  Procedures   Ambulatory referral to [facility]   No orders of the defined types were placed in this encounter.    History of Present Illness:    Joe Klein is a 63 y.o. 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  August of this year he had an echocardiogram my office which shows normal functioning for his 23 mm On-X AVR and normal left ventricular function EF 60 to 65% moderate LVH and mildly diminished GLS -16.5.  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 and when he sees his urologist in follow-up he will direct his attention to it He has an elevated PSA he has been evaluated by MRI to avoid biopsy and he has watchful waiting and observation He has had no other bleeding with his anticoagulant and with On-X valve atrial fibrillation I given him a goal INR of 2.5-3 From a cardiac perspective is made a complete and full recovery and has had no edema shortness of breath chest pain potation or syncope Echocardiogram shows normal ventricular function  BP is elevated in my office he tells me home blood pressures consistently less than 130/80 Past Medical History:  Diagnosis Date   AAA (abdominal aortic aneurysm)    Anxiety 09/22/2022   Aortic stenosis    Ascending aorta enlargement 10/25/2018   Atrial fibrillation (HCC) 12/26/2018   Bicuspid aortic valve 10/25/2018   CHF (congestive heart failure) (HCC)    Chronic combined systolic and diastolic heart failure (HCC) 10/25/2018   Chronic kidney disease    stage 3   CKD (chronic kidney disease) stage 3, GFR 30-59 ml/min (HCC) 10/25/2018   Dysrhythmia  A-fib   Encounter for therapeutic drug monitoring 12/26/2018   Essential hypertension 06/03/2022   Hoarseness 06/03/2022   Hypertension    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 and stage 3 chronic kidney disease (HCC) 10/25/2018   Hyperthyroidism 06/03/2022   Insomnia 09/22/2022   Long term (current) use of  anticoagulants 12/26/2018   S/P ascending aortic aneurysm repair 12/16/2018   Thoracic aortic aneurysm without rupture     Current Medications: Current Meds  Medication Sig   aspirin  81 MG EC tablet Take 81 mg by mouth daily.    atorvastatin  (LIPITOR) 40 MG tablet Take 1 tablet by mouth once daily   cetirizine  (ZYRTEC ) 10 MG tablet Take 1 tablet (10 mg total) by mouth at bedtime as needed for allergies.   furosemide  (LASIX ) 20 MG tablet Take 20 mg by mouth daily as needed (as directed).   hydrALAZINE  (APRESOLINE ) 100 MG tablet Take 1 tablet by mouth twice daily   JARDIANCE  10 MG TABS tablet TAKE 1 TABLET BY MOUTH ONCE DAILY IN THE MORNING   labetalol  (NORMODYNE ) 100 MG tablet Take 1 tablet by mouth twice daily   warfarin (COUMADIN ) 2.5 MG tablet TAKE 2 TABLETS BY MOUTH ON MONDAY, WEDNESDAY AND FRIDAY AND  1  TABLET  ON  TUESDAY,  THURSDAY,  SATURDAY  AND  SUNDAY   [DISCONTINUED] montelukast  (SINGULAIR ) 10 MG tablet Take 1 tablet (10 mg total) by mouth at bedtime as needed (allergies).      EKGs/Labs/Other Studies Reviewed:    The following studies were reviewed today:         Recent Labs: No results found for requested labs within last 365 days.  Recent Lipid Panel    Component Value Date/Time   CHOL 113 03/11/2023 1035   TRIG 100 03/11/2023 1035   HDL 31 (L) 03/11/2023 1035   CHOLHDL 3.6 03/11/2023 1035   LDLCALC 63 03/11/2023 1035    Physical Exam:    VS:  BP (!) 170/70 (BP Location: Left Arm, Patient Position: Sitting)   Pulse (!) 58   Ht 6' 1 (1.854 m)   Wt 189 lb 3.2 oz (85.8 kg)   SpO2 99%   BMI 24.96 kg/m     Wt Readings from Last 3 Encounters:  03/14/24 189 lb 3.2 oz (85.8 kg)  09/20/23 190 lb 6.4 oz (86.4 kg)  06/08/23 188 lb 9.6 oz (85.5 kg)     GEN:  Well nourished, well developed in no acute distress obese HEENT: Normal NECK: No JVD; No carotid bruits LYMPHATICS: No lymphadenopathy CARDIAC: RRR, no murmurs, rubs, gallops RESPIRATORY:  Clear to  auscultation without rales, wheezing or rhonchi  ABDOMEN: Soft, non-tender, non-distended MUSCULOSKELETAL:  No edema; No deformity  SKIN: Warm and dry NEUROLOGIC:  Alert and oriented x 3 PSYCHIATRIC:  Normal affect    Signed, Redell Leiter, MD  03/14/2024 3:48 PM    [facility]

## 2024-03-20 ENCOUNTER — Other Ambulatory Visit: Payer: Self-pay | Admitting: Internal Medicine

## 2024-03-21 LAB — PROTIME-INR
INR: 2.2 — ABNORMAL HIGH (ref 0.9–1.2)
Prothrombin Time: 22.4 s — ABNORMAL HIGH (ref 9.1–12.0)

## 2024-03-29 ENCOUNTER — Other Ambulatory Visit: Payer: Self-pay | Admitting: Internal Medicine

## 2024-03-29 ENCOUNTER — Encounter: Payer: Self-pay | Admitting: Cardiology

## 2024-03-29 NOTE — Addendum Note (Signed)
 Addended by: ONEITA BERLINER on: 03/29/2024 09:37 AM   Modules accepted: Orders

## 2024-04-01 ENCOUNTER — Other Ambulatory Visit: Payer: Self-pay | Admitting: Internal Medicine

## 2024-04-03 ENCOUNTER — Encounter: Payer: Self-pay | Admitting: Cardiology

## 2024-04-03 ENCOUNTER — Other Ambulatory Visit: Payer: Self-pay | Admitting: Cardiology

## 2024-04-03 ENCOUNTER — Other Ambulatory Visit: Payer: Self-pay | Admitting: Emergency Medicine

## 2024-04-03 MED ORDER — EMPAGLIFLOZIN 10 MG PO TABS: 10.0000 mg | ORAL_TABLET | Freq: Every morning | ORAL | 3 refills | Status: AC

## 2024-04-03 MED ORDER — WARFARIN SODIUM 2.5 MG PO TABS: ORAL_TABLET | ORAL | 3 refills | Status: AC

## 2024-04-10 ENCOUNTER — Encounter: Payer: Self-pay | Admitting: Cardiology

## 2024-04-11 ENCOUNTER — Other Ambulatory Visit (HOSPITAL_COMMUNITY): Payer: Self-pay

## 2024-04-11 ENCOUNTER — Telehealth: Payer: Self-pay | Admitting: Pharmacy Technician

## 2024-04-11 NOTE — Telephone Encounter (Signed)
 Pharmacy Patient Advocate Encounter   Received notification from Patient Advice Request messages that prior authorization for Jardiance  10 MG is required/requested.   Insurance verification completed.   The patient is insured through [facility] MEDICAID.   Per test claim: PA required; PA submitted to above mentioned insurance via Latent Key/confirmation #/[id number] [employer] Status is pending

## 2024-04-11 NOTE — Telephone Encounter (Signed)
 Pharmacy Patient Advocate Encounter  Received notification from [facility] MEDICAID that Prior Authorization for Jardiance  has been APPROVED from 04/11/24 to 04/11/25. Ran test claim, Copay is $4.00- 3 months. This test claim was processed through [facility]- copay amounts may vary at other pharmacies due to pharmacy/plan contracts, or as the patient moves through the different stages of their insurance plan.   PA #/Case ID/Reference #: [id number]

## 2024-04-28 ENCOUNTER — Telehealth: Payer: Self-pay | Admitting: *Deleted

## 2024-04-28 MED ORDER — HYDRALAZINE HCL 100 MG PO TABS: 100.0000 mg | ORAL_TABLET | Freq: Two times a day (BID) | ORAL | 3 refills | Status: AC

## 2024-04-28 NOTE — Telephone Encounter (Signed)
 Rx refill sent to pharmacy.

## 2024-05-02 ENCOUNTER — Telehealth: Payer: Self-pay | Admitting: Cardiology

## 2024-05-02 NOTE — Telephone Encounter (Signed)
 Patient would like to know if he can start getting his PT/INR checked here. CB # [phone]

## 2024-05-03 NOTE — Telephone Encounter (Signed)
 PT returning call to nurse

## 2024-05-08 DIAGNOSIS — Z7901 Long term (current) use of anticoagulants: Secondary | ICD-10-CM

## 2024-05-08 DIAGNOSIS — Q2381 Bicuspid aortic valve: Secondary | ICD-10-CM

## 2024-05-08 DIAGNOSIS — I4891 Unspecified atrial fibrillation: Secondary | ICD-10-CM

## 2024-05-09 ENCOUNTER — Ambulatory Visit: Attending: Cardiology

## 2024-05-09 DIAGNOSIS — I4891 Unspecified atrial fibrillation: Secondary | ICD-10-CM

## 2024-05-09 DIAGNOSIS — Z7901 Long term (current) use of anticoagulants: Secondary | ICD-10-CM

## 2024-05-09 LAB — POCT INR: INR: 2.8 (ref 2.0–3.0)

## 2024-05-09 NOTE — Patient Instructions (Signed)
 Continue taking 1 tablet daily, except 2 tablets every Monday, Wednesday and Friday.  [id number]  INR in 6 weeks

## 2024-06-03 ENCOUNTER — Other Ambulatory Visit: Payer: Self-pay | Admitting: Cardiology

## 2024-06-03 DIAGNOSIS — I10 Essential (primary) hypertension: Secondary | ICD-10-CM

## 2024-06-03 DIAGNOSIS — Z952 Presence of prosthetic heart valve: Secondary | ICD-10-CM

## 2024-06-20 ENCOUNTER — Ambulatory Visit
